# Patient Record
Sex: Male | Born: 1960 | Hispanic: No | Marital: Married | Smoking: Former smoker
Health system: Southern US, Community
[De-identification: ages and names within clinical notes are randomized; demographics above are authoritative.]

---

## 2006-04-21 ENCOUNTER — Ambulatory Visit: Payer: Self-pay | Admitting: Internal Medicine

## 2006-04-21 LAB — CONVERTED CEMR LAB
ALT: 52 units/L — ABNORMAL HIGH (ref 0–40)
AST: 28 units/L (ref 0–37)
Alkaline Phosphatase: 70 units/L (ref 39–117)
BUN: 9 mg/dL (ref 6–23)
Basophils Relative: 0.3 % (ref 0.0–1.0)
Calcium: 9.4 mg/dL (ref 8.4–10.5)
Chol/HDL Ratio, serum: 5.2
Cholesterol: 204 mg/dL (ref 0–200)
Glomerular Filtration Rate, Af Am: 116 mL/min/{1.73_m2}
HCT: 44.4 % (ref 39.0–52.0)
HDL: 39.5 mg/dL (ref >39.0)
Hemoglobin, Urine: NEGATIVE
Hemoglobin: 15.1 g/dL (ref 13.0–17.0)
LDL DIRECT: 167.2 mg/dL
Leukocytes, UA: NEGATIVE
Lymphocytes Relative: 31.1 % (ref 12.0–46.0)
Neutrophils Relative %: 54.8 % (ref 43.0–77.0)
Platelets: 285 10*3/uL (ref 150–400)
Potassium: 4.8 meq/L (ref 3.5–5.1)
TSH: 1.23 microintl units/mL (ref 0.35–5.50)
Total Protein: 6.5 g/dL (ref 6.0–8.3)
Urobilinogen, UA: 0.2 (ref 0.0–1.0)
WBC: 5.5 10*3/uL (ref 4.5–10.5)

## 2006-04-27 ENCOUNTER — Ambulatory Visit: Payer: Self-pay | Admitting: Internal Medicine

## 2006-08-25 ENCOUNTER — Ambulatory Visit: Payer: Self-pay

## 2006-08-31 ENCOUNTER — Ambulatory Visit: Payer: Self-pay | Admitting: Internal Medicine

## 2010-09-27 NOTE — Assessment & Plan Note (Signed)
Antonio [name]                           PRIMARY CARE OFFICE NOTE   NAME:[name], [name]                          MRN:          [id number]  DATE:04/27/2006                            DOB:          09/09/1960    CHIEF COMPLAINT:  New patient to practice.   HISTORY OF PRESENT ILLNESS:  The patient is a 50 year old white male  here to establish primary care.  He has not had a primary care physician  in the past.  His medical history is relatively benign.  No history of  major illnesses or hospitalizations.  He has been going to urgent care  facilities for upper respiratory infections and rashes.   His main concern today has been intermittent nonexertional chest  discomfort and his younger brother, at 64, recently underwent bypass  surgery x4.  His mother also is known to have coronary artery disease  and was status post PTCA with stent at age 42.   He works in Airline pilot.  He routinely lifts heavy objects and denies any  exertional symptoms.  In addition, he has never experienced any chest  tightness or pain with exertion.  His symptoms that he has had are  sporadic, not associated with activity.  Described as a cramping-like  sensation that lasts for a few seconds and then disappears.  He has had  a total of 4 episodes of this discomfort within the last month's time.   He has a remote history of tobacco abuse.  Quit tobacco 18 years ago.  He did smoke for 12 years, a pack a day.   PAST MEDICAL HISTORY SUMMARY:  1. History of genital warts.  2. Status post colonoscopy for rectal bleeding.  3. History of a colon polyp.  4. History of urethral stricture 1968 status post dilation.   CURRENT MEDICATIONS:  None, besides p.r.n. aspirin and ibuprofen.   ALLERGIES:  None known.   SOCIAL HISTORY:  The patient is married.  Has 4 children ages 62, 42,  15, and 3.  He is currently in Engineer, building services.   FAMILY HISTORY:  Mother is alive at age 29.   History is noted above.  Father is alive at age 70 and had an aneurysm and resultant right  hemiparesis.  Brother with coronary artery disease at age 80.  No family  history of type 2 diabetes.  No cancer in the family, including colon  cancer.   HABITS:  He averages 3 beers per week.  Quit tobacco 18 years ago.  Smoked 1 pack per day x12 years.   REVIEW OF SYSTEMS:  No fevers or chills.  No HEENT symptoms.  Cardiovascular symptoms as noted above.  No palpitations.  No chronic  cough.  Occasional heartburn/indigestion.  No dysuria, frequency, or  urgency, and all other systems negative.   PHYSICAL EXAM:  VITAL SIGNS:  Height is 5 feet 8 inches, weight 199  pounds, temperature is 98, pulse is 75, BP is 140/85.  GENERAL:  The patient is a pleasant well-developed, well-nourished 29-  year-old white male in no apparent distress.  HEENT:  Normocephalic, atraumatic.  Pupils are equal and reactive to  light bilaterally.  Extraocular motility was intact.  The patient was  anicteric.  Conjunctivae within normal limits.  Oropharyngeal exam was  unremarkable.  External auditory canals and tympanic membranes were  clear bilaterally.  NECK:  Supple.  No adenopathy, carotid bruit, or thyromegaly.  CHEST:  Normal inspiratory effort.  Clear to auscultation bilaterally.  No rhonchi, rales, or wheezing.  CARDIOVASCULAR:  Regular rate and rhythm.  No significant murmurs, rubs,  or gallops appreciated.  ABDOMEN:  Soft and nontender.  Positive bowel sounds.  No organomegaly.  MUSCULOSKELETAL:  No cyanosis, clubbing, or edema.  The patient had  intact pedis dorsalis pulses.  SKIN:  Warm and dry.  NEUROLOGIC:  Cranial nerves 2-12 are grossly intact.  He was nonfocal.   EKG was performed in the office, which showed normal sinus rhythm at 71  beats per minute.  No ST changes were noted.  No T waves.  He had  screening labs before our visit today.  CBC was notable for a normal H&H  of 15.1 and 44.4.   Lipid panel showed the following.  Total cholesterol  204, triglyceride 74, HDL 39.5, and a direct LDL of 167.2.  Comprehensive metabolic profile notable for an elevated glucose at 111,  BUN 9, creatinine 0.9.  ALT was minimally elevated at 52.  TSH was  normal at 1.23, PSA was 0.66, and UA was unremarkable.   IMPRESSION/RECOMMENDATIONS:  1. Atypical chest pain.  2. Elevated blood pressure without diagnosis of hypertension.  3. Hyperlipidemia.  4. Health maintenance.   RECOMMENDATIONS:  The patient will be sent for further cardiac testing.  We will schedule a treadmill Cardiolite regarding dyspnea, a family  history of coronary artery disease.   In terms of his hyperlipidemia, we discussed low fat/saturated fat diet.  He was also started on simvastatin 40 mg p.o. nightly.  On followup  blood test within a month, we will also add a hemoglobin A1C as well as  followup LFTs and lipid profiles.     Antonio Hair. Artist Pais, DO  Electronically Signed   RDY/MedQ  DD: 04/27/2006  DT: 04/27/2006  Job #: [id number]

## 2018-03-23 DIAGNOSIS — C4491 Basal cell carcinoma of skin, unspecified: Secondary | ICD-10-CM

## 2018-03-23 HISTORY — DX: Basal cell carcinoma of skin, unspecified: C44.91

## 2019-11-23 ENCOUNTER — Ambulatory Visit (INDEPENDENT_AMBULATORY_CARE_PROVIDER_SITE_OTHER): Payer: Self-pay | Admitting: Dermatology

## 2019-11-23 ENCOUNTER — Other Ambulatory Visit: Payer: Self-pay

## 2019-11-23 DIAGNOSIS — L821 Other seborrheic keratosis: Secondary | ICD-10-CM

## 2019-11-23 DIAGNOSIS — D171 Benign lipomatous neoplasm of skin and subcutaneous tissue of trunk: Secondary | ICD-10-CM

## 2019-11-23 DIAGNOSIS — C44519 Basal cell carcinoma of skin of other part of trunk: Secondary | ICD-10-CM

## 2019-11-23 DIAGNOSIS — D485 Neoplasm of uncertain behavior of skin: Secondary | ICD-10-CM

## 2019-11-23 DIAGNOSIS — L578 Other skin changes due to chronic exposure to nonionizing radiation: Secondary | ICD-10-CM

## 2019-11-23 DIAGNOSIS — L570 Actinic keratosis: Secondary | ICD-10-CM

## 2019-11-23 DIAGNOSIS — L814 Other melanin hyperpigmentation: Secondary | ICD-10-CM

## 2019-11-23 DIAGNOSIS — Z85828 Personal history of other malignant neoplasm of skin: Secondary | ICD-10-CM

## 2019-11-23 NOTE — Progress Notes (Addendum)
Follow-Up Visit   Subjective  Antonio [name] is a 59 y.o. male who presents for the following: Annual Exam.  Patient here today for annual exam. There was a spot on his back that we were watching and wanted to recheck. Patient does have a history of BCC which was treated with Moh's.  Patient does have some red splotches on his face as well as a spot on right cheek that has been there for about 1 month. He advises it had the texture of a wart, he pinched it off and it bled.   The following portions of the chart were reviewed this encounter and updated as appropriate:      Review of Systems:  No other skin or systemic complaints except as noted in HPI or Assessment and Plan.  Objective  Well appearing patient in no apparent distress; mood and affect are within normal limits.  All skin waist up examined.  Objective  Right Upper Lip: Well healed scar with no evidence of recurrence.   Objective  R Cheek  x 3, L temple x 1, L eyebrow x 1, R ear x 2 (7): Erythematous thin papules/macules with gritty scale. Pink scaly papule R malar cheek (HyAK vrs ISK)  Objective  Right Spinal Upper Back: 10 x 33mm pink scaly macule, slightly pearly  Objective  Right Inferior Clavicle: Subq rubbery nodule 2.5cm   Assessment & Plan  History of basal cell carcinoma (BCC) Right Upper Lip  Clear. Observe for recurrence. Call clinic for new or changing lesions.  Recommend regular skin exams, daily broad-spectrum spf 30+ sunscreen use, and photoprotection.     AK (actinic keratosis) (7) R Cheek  x 3, L temple x 1, L eyebrow x 1, R ear x 2  Destruction of lesion - R Cheek  x 3, L temple x 1, L eyebrow x 1, R ear x 2  Destruction method: cryotherapy   Informed consent: discussed and consent obtained   Lesion destroyed using liquid nitrogen: Yes   Region frozen until ice ball extended beyond lesion: Yes   Outcome: patient tolerated procedure well with no complications   Post-procedure details:  wound care instructions given   Additional details:  Prior to procedure, discussed risks of blister formation, small wound, skin dyspigmentation, or rare scar following cryotherapy.  Neoplasm of uncertain behavior of skin Right Spinal Upper Back  Skin / nail biopsy Type of biopsy: tangential   Informed consent: discussed and consent obtained   Patient was prepped and draped in usual sterile fashion: Area prepped with alcohol. Anesthesia: the lesion was anesthetized in a standard fashion   Anesthetic:  1% lidocaine w/ epinephrine 1-100,000 buffered w/ 8.4% NaHCO3 Instrument used: flexible razor blade   Hemostasis achieved with: pressure and aluminum chloride   Outcome: patient tolerated procedure well    Destruction of lesion Complexity: simple   Destruction method: electrodesiccation and curettage   Informed consent: discussed and consent obtained   Timeout:  patient name, date of birth, surgical site, and procedure verified Curettage performed in three different directions: Yes   Electrodesiccation performed over the curetted area: Yes   Lesion length (cm):  1 Lesion width (cm):  0.5 Margin per side (cm):  0.2 Final wound size (cm):  1.4 Hemostasis achieved with:  pressure, aluminum chloride and electrodesiccation Outcome: patient tolerated procedure well with no complications   Post-procedure details: wound care instructions given   Additional details:  Mupirocin ointment and Bandaid applied    Specimen 1 -  Surgical pathology Differential Diagnosis: AK vs BCC Check Margins: No 10 x 75mm pink scaly macule, slightly pearly  Lipoma of torso Right Inferior Clavicle  Reassured benign growth.  Recommend observation.  Discussed surgical excision in office if changes noted or symptomatic.    BCC (basal cell carcinoma), trunk Lentigines - Scattered tan macules - Discussed due to sun exposure - Benign, observe - Call for any changes Actinic Damage - diffuse erythematous  macules with underlying dyspigmentation - Recommend daily broad spectrum sunscreen SPF 30+ to sun-exposed areas, reapply every 2 hours as needed.  - Call for new or changing lesions. Seborrheic Keratoses - Stuck-on, waxy, tan-brown papules and plaques  - Discussed benign etiology and prognosis. - Observe - Call for any changes  Return for UBSE.  Graciella Belton, RMA, am acting as scribe for Brendolyn Patty, MD . Documentation: I have reviewed the above documentation for accuracy and completeness, and I agree with the above.  Brendolyn Patty MD

## 2019-11-23 NOTE — Patient Instructions (Addendum)
Recommend daily broad spectrum sunscreen SPF 30+ to sun-exposed areas, reapply every 2 hours as needed. Call for new or changing lesions.  Cryotherapy Aftercare  . Wash gently with soap and water everyday.   . Apply Vaseline and Band-Aid daily until healed.  Wound Care Instructions  1. Cleanse wound gently with soap and water once a day then pat dry with clean gauze. Apply a thing coat of Petrolatum (petroleum jelly, "Vaseline") over the wound (unless you have an allergy to this). We recommend that you use a new, sterile tube of Vaseline. Do not pick or remove scabs. Do not remove the yellow or white "healing tissue" from the base of the wound.  2. Cover the wound with fresh, clean, nonstick gauze and secure with paper tape. You may use Band-Aids in place of gauze and tape if the would is small enough, but would recommend trimming much of the tape off as there is often too much. Sometimes Band-Aids can irritate the skin.  3. You should call the office for your biopsy report after 1 week if you have not already been contacted.  4. If you experience any problems, such as abnormal amounts of bleeding, swelling, significant bruising, significant pain, or evidence of infection, please call the office immediately.  5. FOR ADULT SURGERY PATIENTS: If you need something for pain relief you may take 1 extra strength Tylenol (acetaminophen) AND 2 Ibuprofen (200mg each) together every 4 hours as needed for pain. (do not take these if you are allergic to them or if you have a reason you should not take them.) Typically, you may only need pain medication for 1 to 3 days.     

## 2019-11-28 ENCOUNTER — Telehealth: Payer: Self-pay

## 2019-11-28 NOTE — Telephone Encounter (Signed)
Tried to contact patient regarding biopsy results. No answer and voicemail not set up.

## 2019-11-28 NOTE — Telephone Encounter (Signed)
Patient called and informed of biopsy results, patient verbalized understanding.  

## 2020-01-31 ENCOUNTER — Encounter (HOSPITAL_COMMUNITY): Payer: Self-pay

## 2020-01-31 ENCOUNTER — Emergency Department (HOSPITAL_COMMUNITY)
Admission: EM | Admit: 2020-01-31 | Discharge: 2020-01-31 | Discharge status: Against Medical Advice | Payer: HRSA Program | Attending: Emergency Medicine | Admitting: Emergency Medicine

## 2020-01-31 ENCOUNTER — Other Ambulatory Visit: Payer: Self-pay

## 2020-01-31 ENCOUNTER — Emergency Department (HOSPITAL_COMMUNITY): Payer: HRSA Program

## 2020-01-31 DIAGNOSIS — U071 COVID-19: Secondary | ICD-10-CM | POA: Diagnosis not present

## 2020-01-31 DIAGNOSIS — J1282 Pneumonia due to coronavirus disease 2019: Secondary | ICD-10-CM | POA: Diagnosis not present

## 2020-01-31 DIAGNOSIS — C44519 Basal cell carcinoma of skin of other part of trunk: Secondary | ICD-10-CM | POA: Insufficient documentation

## 2020-01-31 DIAGNOSIS — R0602 Shortness of breath: Secondary | ICD-10-CM | POA: Diagnosis present

## 2020-01-31 DIAGNOSIS — R0902 Hypoxemia: Secondary | ICD-10-CM | POA: Diagnosis not present

## 2020-01-31 DIAGNOSIS — Z87891 Personal history of nicotine dependence: Secondary | ICD-10-CM | POA: Diagnosis not present

## 2020-01-31 DIAGNOSIS — C4401 Basal cell carcinoma of skin of lip: Secondary | ICD-10-CM | POA: Diagnosis not present

## 2020-01-31 LAB — COMPREHENSIVE METABOLIC PANEL
ALT: 42 U/L (ref 0–44)
AST: 40 U/L (ref 15–41)
Albumin: 3.1 g/dL — ABNORMAL LOW (ref 3.5–5.0)
Alkaline Phosphatase: 63 U/L (ref 38–126)
Anion gap: 11 (ref 5–15)
BUN: 15 mg/dL (ref 8–23)
CO2: 25 mmol/L (ref 22–32)
Calcium: 8.6 mg/dL — ABNORMAL LOW (ref 8.9–10.3)
Chloride: 99 mmol/L (ref 98–111)
Creatinine, Ser: 0.9 mg/dL (ref 0.61–1.24)
GFR calc Af Amer: >60 mL/min (ref >60)
GFR calc non Af Amer: >60 mL/min (ref >60)
Glucose, Bld: 124 mg/dL — ABNORMAL HIGH (ref 70–99)
Potassium: 4.6 mmol/L (ref 3.5–5.1)
Sodium: 135 mmol/L (ref 135–145)
Total Bilirubin: 0.8 mg/dL (ref 0.3–1.2)
Total Protein: 6.5 g/dL (ref 6.5–8.1)

## 2020-01-31 LAB — CBC WITH DIFFERENTIAL/PLATELET
Abs Immature Granulocytes: 0.18 10*3/uL — ABNORMAL HIGH (ref 0.00–0.07)
Basophils Absolute: 0 10*3/uL (ref 0.0–0.1)
Basophils Relative: 0 %
Eosinophils Absolute: 0 10*3/uL (ref 0.0–0.5)
Eosinophils Relative: 0 %
HCT: 42.9 % (ref 39.0–52.0)
Hemoglobin: 14.8 g/dL (ref 13.0–17.0)
Immature Granulocytes: 2 %
Lymphocytes Relative: 12 %
Lymphs Abs: 1.2 10*3/uL (ref 0.7–4.0)
MCH: 32.9 pg (ref 26.0–34.0)
MCHC: 34.5 g/dL (ref 30.0–36.0)
MCV: 95.3 fL (ref 80.0–100.0)
Monocytes Absolute: 0.9 10*3/uL (ref 0.1–1.0)
Monocytes Relative: 9 %
Neutro Abs: 7.7 10*3/uL (ref 1.7–7.7)
Neutrophils Relative %: 77 %
Platelets: 236 10*3/uL (ref 150–400)
RBC: 4.5 MIL/uL (ref 4.22–5.81)
RDW: 12.1 % (ref 11.5–15.5)
WBC: 10 10*3/uL (ref 4.0–10.5)
nRBC: 0 % (ref 0.0–0.2)

## 2020-01-31 LAB — SARS CORONAVIRUS 2 BY RT PCR (HOSPITAL ORDER, PERFORMED IN ~~LOC~~ HOSPITAL LAB): SARS Coronavirus 2: POSITIVE — AB

## 2020-01-31 LAB — TROPONIN I (HIGH SENSITIVITY): Troponin I (High Sensitivity): 7 ng/L (ref <18)

## 2020-01-31 MED ORDER — DEXAMETHASONE 6 MG PO TABS: 6.0000 mg | ORAL_TABLET | Freq: Every day | ORAL | 0 refills | Status: AC

## 2020-01-31 MED ORDER — DEXAMETHASONE SODIUM PHOSPHATE 10 MG/ML IJ SOLN
10.0000 mg | Freq: Once | INTRAMUSCULAR | Status: AC
  Administered 2020-01-31: 10 mg via INTRAVENOUS
  Filled 2020-01-31: qty 1

## 2020-01-31 MED ORDER — ALBUTEROL SULFATE HFA 108 (90 BASE) MCG/ACT IN AERS
1.0000 | INHALATION_SPRAY | Freq: Four times a day (QID) | RESPIRATORY_TRACT | 0 refills | Status: DC | PRN
Start: 2020-01-31 — End: 2020-01-31

## 2020-01-31 MED ORDER — DEXAMETHASONE 6 MG PO TABS: 6.0000 mg | ORAL_TABLET | Freq: Every day | ORAL | 0 refills | Status: DC

## 2020-01-31 MED ORDER — ALBUTEROL SULFATE HFA 108 (90 BASE) MCG/ACT IN AERS: 1.0000 | INHALATION_SPRAY | Freq: Four times a day (QID) | RESPIRATORY_TRACT | 0 refills | Status: DC | PRN

## 2020-01-31 NOTE — Discharge Instructions (Addendum)
Use the albuterol and the steroids.  You are leaving his medical advice today due to low oxygen levels.  Please return if you change your mind on admission or if your symptoms worsen.

## 2020-01-31 NOTE — ED Triage Notes (Signed)
Pt states SOB x 1 week, Wife tested positive

## 2020-01-31 NOTE — ED Notes (Signed)
Patient ambulated with pulse oximetry in room, oxygen saturation level ranging from 88%-93%. Patient continues to be on room air. AxOx4, no needs at this time, will continue to monitor.

## 2020-01-31 NOTE — ED Provider Notes (Signed)
[facility] DEPT Provider Note   CSN: [id number] Arrival date & time: 01/31/20  1657     History SOB, COVID exposure  Antonio [name] is a 59 y.o. male with history significant for actinic keratosis who presents for evaluation of shortness of breath.  Patient states his wife recently tested positive for Covid.  He has not been tested.  He is unvaccinated.  Has had intermittent cough and shortness of breath over the last few days.  States he has had some chills however has not taken his temperature.  Has been taking Tylenol and ibuprofen intermittently.  He denies headache, lightheadedness, dizziness, chest pain, hemoptysis, abdomino pain, diarrhea, dysuria, leg swelling, redness, warmth.  No prior history of cardiac or lung abnormalities.  No recent surgery, immobilization, unilateral leg swelling, PE, DVT.  Denies additional aggravating or alleviating factors.  History obtained from patient and past medical records.  No interpreter used.  HPI     Past Medical History:  Diagnosis Date  . Basal cell carcinoma 03/23/2018   right upper lip/inf nasolabial  . Basal cell carcinoma 11/23/2019   Right Spinal Upper Back, [facility]    Patient Active Problem List   Diagnosis Date Noted  . Basal cell carcinoma 03/23/2018    History reviewed    History reviewed. No pertinent family history.  Social History   Tobacco Use  . Smoking status: Former Research scientist (life sciences)  . Smokeless tobacco: Never Used  Substance Use Topics  . Alcohol use: Not on file  . Drug use: Not on file    Home Medications Prior to Admission medications   Medication Sig Start Date End Date Taking? Authorizing Provider  acetaminophen (TYLENOL) 500 MG tablet Take 1,000 mg by mouth every 6 (six) hours as needed for moderate pain.   Yes [provider]  Ascorbic Acid (VITAMIN C) 1000 MG tablet Take 1,000 mg by mouth daily.   Yes [provider]  cholecalciferol (VITAMIN D3) 25 MCG (1000  UNIT) tablet Take 1,000 Units by mouth daily.   Yes [provider]  ibuprofen (ADVIL) 200 MG tablet Take 400 mg by mouth every 6 (six) hours as needed for moderate pain.   Yes [provider]  Multiple Vitamins-Minerals (EMERGEN-C IMMUNE PO) Take 1 each by mouth daily.   Yes [provider]  zinc gluconate 50 MG tablet Take 50 mg by mouth daily.   Yes [provider]  albuterol (VENTOLIN HFA) 108 (90 Base) MCG/ACT inhaler Inhale 1-2 puffs into the lungs every 6 (six) hours as needed for wheezing or shortness of breath. 01/31/20   [name], PA-C  dexamethasone (DECADRON) 6 MG tablet Take 1 tablet (6 mg total) by mouth daily for 5 days. 01/31/20 02/05/20  [name], PA-C    Allergies    Tape  Review of Systems   Review of Systems  Constitutional: Positive for chills and fatigue. Negative for activity change, appetite change, diaphoresis, fever and unexpected weight change.  HENT: Negative.   Respiratory: Positive for cough and shortness of breath. Negative for apnea, choking, chest tightness, wheezing and stridor.   Cardiovascular: Negative.   Gastrointestinal: Negative.   Genitourinary: Negative.   Musculoskeletal: Negative.   Skin: Negative.   Neurological: Negative.   All other systems reviewed and are negative.   Physical Exam Updated Vital Signs BP 110/66   Pulse 74   Temp 98.4 F (36.9 C)   Resp 19   Ht 5\' 8"  (1.727 m)   Wt  95.3 kg   SpO2 93%   BMI 31.93 kg/m   Physical Exam Vitals and nursing note reviewed.  Constitutional:      General: He is not in acute distress.    Appearance: Normal appearance. He is well-developed. He is not ill-appearing, toxic-appearing or diaphoretic.  HENT:     Head: Normocephalic and atraumatic.     Nose: Nose normal.     Mouth/Throat:     Mouth: Mucous membranes are moist.  Eyes:     Pupils: Pupils are equal, round, and reactive to light.  Cardiovascular:     Rate and Rhythm:  Normal rate and regular rhythm.     Pulses: Normal pulses.     Heart sounds: Normal heart sounds.  Pulmonary:     Effort: Pulmonary effort is normal. No respiratory distress.     Comments: Very minimal crackles at bases to right lung.  Speaks in full sentences without difficulty.  No acute respiratory distress. Abdominal:     General: Bowel sounds are normal. There is no distension.     Palpations: Abdomen is soft.     Tenderness: There is no abdominal tenderness. There is no right CVA tenderness, left CVA tenderness, guarding or rebound.     Comments: Soft, nontender without rebound or guarding no overlying skin changes to abdominal wall  Musculoskeletal:        General: Normal range of motion.     Cervical back: Normal range of motion and neck supple.     Comments: Moves all 4 extremities without difficulty.  Compartments soft.  Bevelyn Buckles' sign negative.  No bony tenderness.  Skin:    General: Skin is warm and dry.     Capillary Refill: Capillary refill takes less than 2 seconds.     Comments: No edema, erythema or warmth.  No fluctuance or induration.  Neurological:     General: No focal deficit present.     Mental Status: He is alert and oriented to person, place, and time.     Comments: Cranial nerves II through XII grossly intact. Ambulatory without difficulty.     ED Results / Procedures / Treatments   Labs (all labs ordered are listed, but only abnormal results are displayed) Labs Reviewed  SARS CORONAVIRUS 2 BY RT PCR (HOSPITAL ORDER, [facility]) - Abnormal; Notable for the following components:      Result Value   SARS Coronavirus 2 POSITIVE (*)    All other components within normal limits  CBC WITH DIFFERENTIAL/PLATELET - Abnormal; Notable for the following components:   Abs Immature Granulocytes 0.18 (*)    All other components within normal limits  COMPREHENSIVE METABOLIC PANEL - Abnormal; Notable for the following components:   Glucose, Bld  124 (*)    Calcium 8.6 (*)    Albumin 3.1 (*)    All other components within normal limits  TROPONIN I (HIGH SENSITIVITY)  TROPONIN I (HIGH SENSITIVITY)    EKG EKG Interpretation  Date/Time:  Tuesday January 31 2020 17:15:12 EDT Ventricular Rate:  62 PR Interval:    QRS Duration: 89 QT Interval:  433 QTC Calculation: 440 R Axis:   78 Text Interpretation: Sinus rhythm No previous ECGs available Confirmed by Ripley Fraise [phone]) on 01/31/2020 5:19:28 PM   Radiology DG Chest Portable 1 View  Result Date: 01/31/2020 CLINICAL DATA:  Cough and shortness of breath, COVID exposure. Wife tested positive. EXAM: PORTABLE CHEST 1 VIEW COMPARISON:  None. FINDINGS: The heart size and mediastinal  contours are within normal limits. Diffuse peripheral hazy airspace opacities. No pulmonary edema. No pleural effusion. No pneumothorax. No acute osseous abnormality. IMPRESSION: Findings consistent with COVID-19 multifocal pneumonia. Electronically Signed   By: Iven Finn M.D.   On: 01/31/2020 18:11    Procedures Procedures (including critical care time)  Medications Ordered in ED Medications  dexamethasone (DECADRON) injection 10 mg (10 mg Intravenous Given 01/31/20 2130)    ED Course  I have reviewed the triage vital signs and the nursing notes.  Pertinent labs & imaging results that were available during my care of the patient were reviewed by me and considered in my medical decision making (see chart for details).  59 year old presents for evaluation of shortness of breath and cough.  He is afebrile, nonseptic, non-ill-appearing.  He is unvaccinated for Covid however his wife recently tested positive for Covid.  He has no chest pain.  Has very minimal crackles and faint wheeze to right lower lobe however does not appear in respiratory distress.  He has no tachycardia, tachypnea or hypoxia.  His abdomen is soft, nontender.  He is neurovascularly intact.  He has a normal musculoskeletal  exam.  His compartments are soft.  No clinical evidence of DVT on exam.  He appears overall well.  We will plan on labs, imaging and reassess.  Labs and imaging personally reviewed and interpreted:  CBC without leukocytosis CMP hyperglycemia at 124, calcium 8.6, albumin 3.1 no additional electrolyte, renal or liver normality Trop 7 COVID POSITIVE EKG without STEMI DG chest multifocal pneumonia consistent with Covid pneumonia  Patient reassessed.  Shortness of breath likely due to his Covid pneumonia.  I low suspicion for ACS, PE or dissection.  Low suspicion for acute bacterial pneumonia.  Patient reassessed. Oxygen saturations 90% on room air however patient becomes hypoxic into the 80s with ambulation or minimal talking in the room.  Patient is reluctant for admission however does ultimately agree for admission.  Declines remdesivir. Ok with steroids.  Patient requesting to speak with me about admission. He is now adamant he does not want admission. I discussed with patient that his oxygen levels are low when he speaks or when he ambulates.  Patient is still adamant that he does not want admission.  Patient has capacity make medical decisions.  I discussed risk versus benefit.  He voices understanding.  He still declines admission at this time.  We will give him symptomatic management with steroids. Declines MAB treatment.  We discussed the nature and purpose, risks and benefits, as well as, the alternatives of treatment. Time was given to allow the opportunity to ask questions and consider their options, and after the discussion, the patient decided to refuse the offerred treatment. The patient was informed that refusal could lead to, but was not limited to, death, permanent disability, or severe pain. If present, I asked the relatives or significant others to dissuade them without success. Prior to refusing, I determined that the patient had the capacity to make their decision and understood the  consequences of that decision. After refusal, I made every reasonable opportunity to treat them to the best of my ability.  The patient was notified that they may return to the emergency department at any time for further treatment.     Patient discussed with attending Dr. Tamera Punt.    MDM Rules/Calculators/A&P                          Alfredo Batty  Nepomuceno was evaluated in Emergency Department on 01/31/2020 for the symptoms described in the history of present illness. He was evaluated in the context of the global COVID-19 pandemic, which necessitated consideration that the patient might be at risk for infection with the SARS-CoV-2 virus that causes COVID-19. Institutional protocols and algorithms that pertain to the evaluation of patients at risk for COVID-19 are in a state of rapid change based on information released by regulatory bodies including the CDC and federal and state organizations. These policies and algorithms were followed during the patient's care in the ED. Final Clinical Impression(s) / ED Diagnoses Final diagnoses:  Pneumonia due to COVID-19 virus  Hypoxia    Rx / DC Orders ED Discharge Orders         Ordered    dexamethasone (DECADRON) 6 MG tablet  Daily,   Status:  Discontinued        01/31/20 2131    albuterol (VENTOLIN HFA) 108 (90 Base) MCG/ACT inhaler  Every 6 hours PRN,   Status:  Discontinued        01/31/20 2131    albuterol (VENTOLIN HFA) 108 (90 Base) MCG/ACT inhaler  Every 6 hours PRN        01/31/20 2143    dexamethasone (DECADRON) 6 MG tablet  Daily        01/31/20 2143           Laina Guerrieri A, PA-C 01/31/20 2229    Malvin Johns, MD 01/31/20 2245

## 2020-02-13 ENCOUNTER — Ambulatory Visit (INDEPENDENT_AMBULATORY_CARE_PROVIDER_SITE_OTHER): Payer: Self-pay | Admitting: Dermatology

## 2020-02-13 ENCOUNTER — Other Ambulatory Visit: Payer: Self-pay

## 2020-02-13 DIAGNOSIS — D485 Neoplasm of uncertain behavior of skin: Secondary | ICD-10-CM

## 2020-02-13 DIAGNOSIS — L578 Other skin changes due to chronic exposure to nonionizing radiation: Secondary | ICD-10-CM

## 2020-02-13 DIAGNOSIS — D492 Neoplasm of unspecified behavior of bone, soft tissue, and skin: Secondary | ICD-10-CM

## 2020-02-13 NOTE — Patient Instructions (Signed)

## 2020-02-13 NOTE — Progress Notes (Signed)
   Follow-Up Visit   Subjective  Antonio [name] is a 59 y.o. male who presents for the following: hx of ak has worsened (R cheek, LN2 11/23/19, now it bleeds when hit).  He has multiple AKs treated last visit, but this is the only one that didn't clear and got worse.   The following portions of the chart were reviewed this encounter and updated as appropriate:      Review of Systems:  No other skin or systemic complaints except as noted in HPI or Assessment and Plan.  Objective  Well appearing patient in no apparent distress; mood and affect are within normal limits.  A focused examination was performed including face. Relevant physical exam findings are noted in the Assessment and Plan.  Objective  R malar cheek: Dark brown black crusted pap 4.38mm      Assessment & Plan  Neoplasm of skin R malar cheek  Skin / nail biopsy Type of biopsy: tangential   Informed consent: discussed and consent obtained   Anesthesia: the lesion was anesthetized in a standard fashion   Anesthesia comment:  Area prepped with alcohol Anesthetic:  1% lidocaine w/ epinephrine 1-100,000 buffered w/ 8.4% NaHCO3 Instrument used: flexible razor blade   Hemostasis achieved with: pressure, aluminum chloride and electrodesiccation   Outcome: patient tolerated procedure well   Post-procedure details: wound care instructions given   Post-procedure details comment:  Ointment and small bandage applied  Specimen 1 - Surgical pathology Differential Diagnosis: D48.5 ISK vs AK vs other Check Margins: No Dark brown black crusted pap 4.27mm Hx of recent LN2  ISK vs AK vs Pyogenic Granuloma vs other, bx today  Actinic Damage - diffuse erythematous macules with underlying dyspigmentation - Recommend daily broad spectrum sunscreen SPF 30+ to sun-exposed areas, reapply every 2 hours as needed.  - Call for new or changing lesions.   Return for as scheduled for [facility] 06/25/2020 at 9:15.  I, Othelia Pulling, RMA, am  acting as scribe for Brendolyn Patty, MD .  Documentation: I have reviewed the above documentation for accuracy and completeness, and I agree with the above.  Brendolyn Patty MD

## 2020-02-20 ENCOUNTER — Telehealth: Payer: Self-pay

## 2020-02-20 NOTE — Telephone Encounter (Signed)
Left pt message to call for bx results/sh °

## 2020-02-20 NOTE — Telephone Encounter (Signed)
Informed pt of results and scheduled f/u.

## 2020-02-20 NOTE — Telephone Encounter (Signed)
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  Will need to recheck area once healed and possibly retreat with Ln2.  Schedule f/up 4-6 weeks.

## 2020-03-19 ENCOUNTER — Other Ambulatory Visit: Payer: Self-pay

## 2020-03-19 ENCOUNTER — Ambulatory Visit (INDEPENDENT_AMBULATORY_CARE_PROVIDER_SITE_OTHER): Payer: Self-pay | Admitting: Dermatology

## 2020-03-19 DIAGNOSIS — Z85828 Personal history of other malignant neoplasm of skin: Secondary | ICD-10-CM

## 2020-03-19 DIAGNOSIS — L570 Actinic keratosis: Secondary | ICD-10-CM

## 2020-03-19 DIAGNOSIS — L578 Other skin changes due to chronic exposure to nonionizing radiation: Secondary | ICD-10-CM

## 2020-03-19 DIAGNOSIS — Z872 Personal history of diseases of the skin and subcutaneous tissue: Secondary | ICD-10-CM

## 2020-03-19 NOTE — Patient Instructions (Signed)
Cryotherapy Aftercare  . Wash gently with soap and water everyday.   . Apply Vaseline and Band-Aid daily until healed.  

## 2020-03-19 NOTE — Progress Notes (Signed)
   Follow-Up Visit   Subjective  Antonio [name] is a 59 y.o. male who presents for the following: Follow-up (Biopsy 02/13/20 of the right malar cheek showed hyperplastic AK with HPV related changes. Patient states area is clear.). He also has a pink spot on his right temple that has come up in the last few days.   The following portions of the chart were reviewed this encounter and updated as appropriate:      Review of Systems:  No other skin or systemic complaints except as noted in HPI or Assessment and Plan.  Objective  Well appearing patient in no apparent distress; mood and affect are within normal limits.  A focused examination was performed including face. Relevant physical exam findings are noted in the Assessment and Plan.  Objective  Right Malar Cheek: Healed biopsy site.  Objective  Right Sideburn x 1: Pink scaly patch, 1.0cm.  Objective  Right Spinal Upper Back: Well healed scar with no evidence of recurrence.    Assessment & Plan   Actinic Damage - chronic, secondary to cumulative UV radiation exposure/sun exposure over time - diffuse scaly erythematous macules with underlying dyspigmentation - Recommend daily broad spectrum sunscreen SPF 30+ to sun-exposed areas, reapply every 2 hours as needed.  - Call for new or changing lesions.   History of actinic keratosis Right Malar Cheek  Hx of Hyperplastic AK with HPV Related Changes, biopsy proven.  Clear. Observe for recurrence. Call clinic for new or changing lesions.  Recommend regular skin exams, daily broad-spectrum spf 30+ sunscreen use, and photoprotection.     AK (actinic keratosis) Right Sideburn x 1  Destruction of lesion - Right Sideburn x 1  Destruction method: cryotherapy   Informed consent: discussed and consent obtained   Lesion destroyed using liquid nitrogen: Yes   Region frozen until ice ball extended beyond lesion: Yes   Outcome: patient tolerated procedure well with no complications     Post-procedure details: wound care instructions given    History of basal cell carcinoma (BCC) Right Spinal Upper Back  Clear. Observe for recurrence. Call clinic for new or changing lesions.  Recommend regular skin exams, daily broad-spectrum spf 30+ sunscreen use, and photoprotection.     Return as scheduled for UBSE.   IJamesetta Orleans, CMA, am acting as scribe for Brendolyn Patty, MD .  Documentation: I have reviewed the above documentation for accuracy and completeness, and I agree with the above.  Brendolyn Patty MD

## 2020-06-25 ENCOUNTER — Encounter: Payer: Self-pay | Admitting: Dermatology

## 2021-05-29 ENCOUNTER — Ambulatory Visit (INDEPENDENT_AMBULATORY_CARE_PROVIDER_SITE_OTHER): Payer: Self-pay | Admitting: Dermatology

## 2021-05-29 ENCOUNTER — Other Ambulatory Visit: Payer: Self-pay

## 2021-05-29 DIAGNOSIS — L578 Other skin changes due to chronic exposure to nonionizing radiation: Secondary | ICD-10-CM

## 2021-05-29 DIAGNOSIS — L814 Other melanin hyperpigmentation: Secondary | ICD-10-CM

## 2021-05-29 DIAGNOSIS — Z85828 Personal history of other malignant neoplasm of skin: Secondary | ICD-10-CM

## 2021-05-29 DIAGNOSIS — L219 Seborrheic dermatitis, unspecified: Secondary | ICD-10-CM

## 2021-05-29 DIAGNOSIS — L821 Other seborrheic keratosis: Secondary | ICD-10-CM

## 2021-05-29 DIAGNOSIS — L570 Actinic keratosis: Secondary | ICD-10-CM

## 2021-05-29 NOTE — Patient Instructions (Addendum)
Cryotherapy Aftercare  Wash gently with soap and water everyday.   Apply Vaseline and Band-Aid daily until healed.    Seborrheic Dermatitis  -  is a chronic persistent rash characterized by pinkness and scaling most commonly of the mid face but also can occur on the scalp (dandruff), ears; mid chest, mid back and groin.  It tends to be exacerbated by stress and cooler weather.  People who have neurologic disease may experience new onset or exacerbation of existing seborrheic dermatitis.  The condition is not curable but treatable and can be controlled. 1% Hydrodortisone Cream - Apply to scaly spots between eyes 1-2 times a day as needed.   If You Need Anything After Your Visit  If you have any questions or concerns for your doctor, please call our main line at [phone] and press option 4 to reach your doctor's medical assistant. If no one answers, please leave a voicemail as directed and we will return your call as soon as possible. Messages left after 4 pm will be answered the following business day.   You may also send [place] a message via [place]. We typically respond to MyChart messages within 1-2 business days.  For prescription refills, please ask your pharmacy to contact our office. Our fax number is [phone].  If you have an urgent issue when the clinic is closed that cannot wait until the next business day, you can page your doctor at the number below.    Please note that while we do our best to be available for urgent issues outside of office hours, we are not available 24/7.   If you have an urgent issue and are unable to reach [place], you may choose to seek medical care at your doctor's office, retail clinic, urgent care center, or emergency room.  If you have a medical emergency, please immediately call 911 or go to the emergency department.  Pager Numbers  - Dr. Nehemiah Massed: [phone]  - Dr. Laurence Ferrari: [phone]  - Dr. Nicole Kindred: [phone]  In the event of inclement  weather, please call our main line at [phone] for an update on the status of any delays or closures.  Dermatology Medication Tips: Please keep the boxes that topical medications come in in order to help keep track of the instructions about where and how to use these. Pharmacies typically print the medication instructions only on the boxes and not directly on the medication tubes.   If your medication is too expensive, please contact our office at [phone] option 4 or send [place] a message through [place].   We are unable to tell what your co-pay for medications will be in advance as this is different depending on your insurance coverage. However, we may be able to find a substitute medication at lower cost or fill out paperwork to get insurance to cover a needed medication.   If a prior authorization is required to get your medication covered by your insurance company, please allow [place] 1-2 business days to complete this process.  Drug prices often vary depending on where the prescription is filled and some pharmacies may offer cheaper prices.  The website www.goodrx.com contains coupons for medications through different pharmacies. The prices here do not account for what the cost may be with help from insurance (it may be cheaper with your insurance), but the website can give you the price if you did not use any insurance.  - You can print the associated coupon and take it with your prescription to the pharmacy.  -  You may also stop by our office during regular business hours and pick up a GoodRx coupon card.  - If you need your prescription sent electronically to a different pharmacy, notify our office through [facility] or by phone at [phone] option 4.     Si Usted Necesita Algo Despus de Su Visita  Tambin puede enviarnos un mensaje a travs de Pharmacist, community. Por lo general respondemos a los mensajes de MyChart en el transcurso de 1 a 2 das hbiles.  Para renovar recetas,  por favor pida a su farmacia que se ponga en contacto con nuestra oficina. Harland Dingwall de fax es [place] [phone].  Si tiene un asunto urgente cuando la clnica est cerrada y que no puede esperar hasta el siguiente da hbil, puede llamar/localizar a su doctor(a) al nmero que aparece a continuacin.   Por favor, tenga en cuenta que aunque hacemos todo lo posible para estar disponibles para asuntos urgentes fuera del horario de [place], no estamos disponibles las 24 horas del da, los 7 das de la [place].   Si tiene un problema urgente y no puede comunicarse con nosotros, puede optar por buscar atencin mdica  en el consultorio de su doctor(a), en una clnica privada, en un centro de atencin urgente o en una sala de emergencias.  Si tiene Engineering geologist, por favor llame inmediatamente al 911 o vaya a la sala de emergencias.  Nmeros de bper  - Dr. Nehemiah Massed: [phone]  - Dra. Moye: [phone]  - Dra. Nicole Kindred: [phone]  En caso de inclemencias del [place], por favor llame a Johnsie Kindred principal al [phone] para una actualizacin sobre el [place] de cualquier retraso o cierre.  Consejos para la medicacin en dermatologa: Por favor, guarde las cajas en las que vienen los medicamentos de uso tpico para ayudarle a seguir las instrucciones sobre dnde y cmo usarlos. Las farmacias generalmente imprimen las instrucciones del medicamento slo en las cajas y no directamente en los tubos del [place].   Si su medicamento es muy caro, por favor, pngase en contacto con Zigmund Daniel llamando al [phone] y presione la opcin 4 o envenos un mensaje a travs de Pharmacist, community.   No podemos decirle cul ser su copago por los medicamentos por adelantado ya que esto es diferente dependiendo de la cobertura de su seguro. Sin embargo, es posible que podamos encontrar un medicamento sustituto a Electrical engineer un formulario para que el seguro cubra el medicamento que se  considera necesario.   Si se requiere una autorizacin previa para que su compaa de seguros [place] su medicamento, por favor permtanos de 1 a 2 das hbiles para completar este proceso.  Los precios de los medicamentos varan con frecuencia dependiendo del Environmental consultant de dnde se surte la receta y alguna farmacias pueden ofrecer precios ms baratos.  El sitio web www.goodrx.com tiene cupones para medicamentos de Airline pilot. Los precios aqu no tienen en cuenta lo que podra costar con la ayuda del seguro (puede ser ms barato con su seguro), pero el sitio web puede darle el precio si no utiliz Research scientist (physical sciences).  - Puede imprimir el cupn correspondiente y llevarlo con su receta a la farmacia.  - Tambin puede pasar por nuestra oficina durante el horario de atencin regular y Charity fundraiser una tarjeta de cupones de GoodRx.  - Si necesita que su receta se enve electrnicamente a Chiropodist, informe a nuestra oficina a travs de MyChart de [place] o por telfono llamando al [phone] y  presione la opcin 4.

## 2021-05-29 NOTE — Progress Notes (Signed)
Follow-Up Visit   Subjective  Antonio [name] is a 61 y.o. male who presents for the following: Sore spot (Scalp x 6 months. Won't heal. No known history of bleeding. ). Hx of BCCs of the right upper lip/inf nasolabial (2019) and right spinal upper back (2021). Pt is self pay.   The following portions of the chart were reviewed this encounter and updated as appropriate:       Review of Systems:  No other skin or systemic complaints except as noted in HPI or Assessment and Plan.  Objective  Well appearing patient in no apparent distress; mood and affect are within normal limits.  All skin waist up examined.  Mid crown Keratotic papule  L eyebrow/temple x 4 (4) Pink scaly macules.   nasal root Mild erythema and scale    Assessment & Plan  Actinic Damage - chronic, secondary to cumulative UV radiation exposure/sun exposure over time - diffuse scaly erythematous macules with underlying dyspigmentation - Recommend daily broad spectrum sunscreen SPF 30+ to sun-exposed areas, reapply every 2 hours as needed.  - Recommend staying in the shade or wearing long sleeves, sun glasses (UVA+UVB protection) and wide brim hats (4-inch brim around the entire circumference of the hat). - Call for new or changing lesions.  Lentigines - Scattered tan macules - Due to sun exposure - Benign-appering, observe - Recommend daily broad spectrum sunscreen SPF 30+ to sun-exposed areas, reapply every 2 hours as needed. - Call for any changes  Seborrheic Keratoses - Stuck-on, waxy, tan-brown papules and/or plaques  - Benign-appearing - Discussed benign etiology and prognosis. - Observe - Call for any changes  Hypertrophic actinic keratosis Mid crown  vs ISK  Pt will RTC if doesn't clear.  Actinic keratoses are precancerous spots that appear secondary to cumulative UV radiation exposure/sun exposure over time. They are chronic with expected duration over 1 year. A portion of actinic  keratoses will progress to squamous cell carcinoma of the skin. It is not possible to reliably predict which spots will progress to skin cancer and so treatment is recommended to prevent development of skin cancer.  Recommend daily broad spectrum sunscreen SPF 30+ to sun-exposed areas, reapply every 2 hours as needed.  Recommend staying in the shade or wearing long sleeves, sun glasses (UVA+UVB protection) and wide brim hats (4-inch brim around the entire circumference of the hat). Call for new or changing lesions.  Destruction of lesion - Mid crown  Destruction method: cryotherapy   Informed consent: discussed and consent obtained   Lesion destroyed using liquid nitrogen: Yes   Region frozen until ice ball extended beyond lesion: Yes   Outcome: patient tolerated procedure well with no complications   Post-procedure details: wound care instructions given   Additional details:  Prior to procedure, discussed risks of blister formation, small wound, skin dyspigmentation, or rare scar following cryotherapy. Recommend Vaseline ointment to treated areas while healing.   AK (actinic keratosis) (4) L eyebrow/temple x 4  Actinic keratoses are precancerous spots that appear secondary to cumulative UV radiation exposure/sun exposure over time. They are chronic with expected duration over 1 year. A portion of actinic keratoses will progress to squamous cell carcinoma of the skin. It is not possible to reliably predict which spots will progress to skin cancer and so treatment is recommended to prevent development of skin cancer.  Recommend daily broad spectrum sunscreen SPF 30+ to sun-exposed areas, reapply every 2 hours as needed.  Recommend staying in the shade or wearing  long sleeves, sun glasses (UVA+UVB protection) and wide brim hats (4-inch brim around the entire circumference of the hat). Call for new or changing lesions.  Destruction of lesion - L eyebrow/temple x 4  Destruction method:  cryotherapy   Informed consent: discussed and consent obtained   Lesion destroyed using liquid nitrogen: Yes   Region frozen until ice ball extended beyond lesion: Yes   Outcome: patient tolerated procedure well with no complications   Post-procedure details: wound care instructions given   Additional details:  Prior to procedure, discussed risks of blister formation, small wound, skin dyspigmentation, or rare scar following cryotherapy. Recommend Vaseline ointment to treated areas while healing.   Seborrheic dermatitis nasal root  Start 1% hydrocortisone cream qd/bid prn flares.  Continue Head & Shoulders shampoo to scalp.  Seborrheic Dermatitis  -  is a chronic persistent rash characterized by pinkness and scaling most commonly of the mid face but also can occur on the scalp (dandruff), ears; mid chest, mid back and groin.  It tends to be exacerbated by stress and cooler weather.  People who have neurologic disease may experience new onset or exacerbation of existing seborrheic dermatitis.  The condition is not curable but treatable and can be controlled.   History of Basal Cell Carcinoma of the Skin - No evidence of recurrence today of the R upper lip/inf nasolabial and R spinal upper back - Recommend regular full body skin exams - Recommend daily broad spectrum sunscreen SPF 30+ to sun-exposed areas, reapply every 2 hours as needed.  - Call if any new or changing lesions are noted between office visits  Return if symptoms worsen or fail to improve.  IJamesetta Orleans, CMA, am acting as scribe for Brendolyn Patty, MD . Documentation: I have reviewed the above documentation for accuracy and completeness, and I agree with the above.  Brendolyn Patty MD

## 2021-08-01 ENCOUNTER — Encounter (HOSPITAL_COMMUNITY): Payer: Self-pay | Admitting: Pharmacy Technician

## 2021-08-01 ENCOUNTER — Inpatient Hospital Stay (HOSPITAL_COMMUNITY)
Admission: EM | Admit: 2021-08-01 | Discharge: 2021-08-10 | DRG: 234 | Disposition: A | Discharge status: Home / Self Care | Payer: Self-pay | Attending: Thoracic Surgery (Cardiothoracic Vascular Surgery) | Admitting: Thoracic Surgery (Cardiothoracic Vascular Surgery)

## 2021-08-01 ENCOUNTER — Encounter (HOSPITAL_COMMUNITY)
Admission: EM | Disposition: A | Discharge status: Home / Self Care | Payer: Self-pay | Source: Home / Self Care | Attending: Cardiology

## 2021-08-01 ENCOUNTER — Other Ambulatory Visit: Payer: Self-pay

## 2021-08-01 ENCOUNTER — Emergency Department (HOSPITAL_COMMUNITY): Payer: Self-pay

## 2021-08-01 DIAGNOSIS — Z951 Presence of aortocoronary bypass graft: Secondary | ICD-10-CM

## 2021-08-01 DIAGNOSIS — E669 Obesity, unspecified: Secondary | ICD-10-CM | POA: Diagnosis present

## 2021-08-01 DIAGNOSIS — D62 Acute posthemorrhagic anemia: Secondary | ICD-10-CM | POA: Diagnosis not present

## 2021-08-01 DIAGNOSIS — R911 Solitary pulmonary nodule: Secondary | ICD-10-CM

## 2021-08-01 DIAGNOSIS — E785 Hyperlipidemia, unspecified: Secondary | ICD-10-CM

## 2021-08-01 DIAGNOSIS — Z20822 Contact with and (suspected) exposure to covid-19: Secondary | ICD-10-CM | POA: Diagnosis present

## 2021-08-01 DIAGNOSIS — Z8249 Family history of ischemic heart disease and other diseases of the circulatory system: Secondary | ICD-10-CM

## 2021-08-01 DIAGNOSIS — Z6833 Body mass index (BMI) 33.0-33.9, adult: Secondary | ICD-10-CM

## 2021-08-01 DIAGNOSIS — Z7982 Long term (current) use of aspirin: Secondary | ICD-10-CM

## 2021-08-01 DIAGNOSIS — Z79899 Other long term (current) drug therapy: Secondary | ICD-10-CM

## 2021-08-01 DIAGNOSIS — I249 Acute ischemic heart disease, unspecified: Secondary | ICD-10-CM | POA: Diagnosis present

## 2021-08-01 DIAGNOSIS — I214 Non-ST elevation (NSTEMI) myocardial infarction: Principal | ICD-10-CM

## 2021-08-01 DIAGNOSIS — Z85828 Personal history of other malignant neoplasm of skin: Secondary | ICD-10-CM

## 2021-08-01 DIAGNOSIS — R030 Elevated blood-pressure reading, without diagnosis of hypertension: Secondary | ICD-10-CM

## 2021-08-01 DIAGNOSIS — E7800 Pure hypercholesterolemia, unspecified: Secondary | ICD-10-CM | POA: Diagnosis present

## 2021-08-01 DIAGNOSIS — Z87891 Personal history of nicotine dependence: Secondary | ICD-10-CM

## 2021-08-01 DIAGNOSIS — R739 Hyperglycemia, unspecified: Secondary | ICD-10-CM | POA: Diagnosis not present

## 2021-08-01 DIAGNOSIS — I10 Essential (primary) hypertension: Secondary | ICD-10-CM | POA: Diagnosis present

## 2021-08-01 DIAGNOSIS — I2510 Atherosclerotic heart disease of native coronary artery without angina pectoris: Secondary | ICD-10-CM

## 2021-08-01 DIAGNOSIS — R072 Precordial pain: Secondary | ICD-10-CM

## 2021-08-01 DIAGNOSIS — I459 Conduction disorder, unspecified: Secondary | ICD-10-CM | POA: Diagnosis not present

## 2021-08-01 HISTORY — PX: LEFT HEART CATH AND CORONARY ANGIOGRAPHY: CATH118249

## 2021-08-01 LAB — TROPONIN I (HIGH SENSITIVITY)
Troponin I (High Sensitivity): 162 ng/L (ref <18)
Troponin I (High Sensitivity): 169 ng/L (ref <18)

## 2021-08-01 LAB — BASIC METABOLIC PANEL
Anion gap: 6 (ref 5–15)
BUN: 10 mg/dL (ref 8–23)
CO2: 28 mmol/L (ref 22–32)
Calcium: 9.6 mg/dL (ref 8.9–10.3)
Chloride: 104 mmol/L (ref 98–111)
Creatinine, Ser: 1.12 mg/dL (ref 0.61–1.24)
GFR, Estimated: >60 mL/min (ref >60)
Glucose, Bld: 99 mg/dL (ref 70–99)
Potassium: 4.8 mmol/L (ref 3.5–5.1)
Sodium: 138 mmol/L (ref 135–145)

## 2021-08-01 LAB — CBC
HCT: 54.9 % — ABNORMAL HIGH (ref 39.0–52.0)
Hemoglobin: 18.7 g/dL — ABNORMAL HIGH (ref 13.0–17.0)
MCH: 32 pg (ref 26.0–34.0)
MCHC: 34.1 g/dL (ref 30.0–36.0)
MCV: 93.8 fL (ref 80.0–100.0)
Platelets: 224 10*3/uL (ref 150–400)
RBC: 5.85 MIL/uL — ABNORMAL HIGH (ref 4.22–5.81)
RDW: 12.4 % (ref 11.5–15.5)
WBC: 7.6 10*3/uL (ref 4.0–10.5)
nRBC: 0 % (ref 0.0–0.2)

## 2021-08-01 LAB — BRAIN NATRIURETIC PEPTIDE: B Natriuretic Peptide: 26.3 pg/mL (ref 0.0–100.0)

## 2021-08-01 SURGERY — LEFT HEART CATH AND CORONARY ANGIOGRAPHY
Anesthesia: LOCAL

## 2021-08-01 MED ORDER — VERAPAMIL HCL 2.5 MG/ML IV SOLN
INTRAVENOUS | Status: AC
  Filled 2021-08-01: qty 2

## 2021-08-01 MED ORDER — SODIUM CHLORIDE 0.9 % IV SOLN: 250.0000 mL | INTRAVENOUS | Status: DC | PRN

## 2021-08-01 MED ORDER — SODIUM CHLORIDE 0.9% FLUSH
3.0000 mL | Freq: Two times a day (BID) | INTRAVENOUS | Status: DC
  Administered 2021-08-01 – 2021-08-05 (×3): 3 mL via INTRAVENOUS

## 2021-08-01 MED ORDER — HEPARIN SODIUM (PORCINE) 1000 UNIT/ML IJ SOLN
INTRAMUSCULAR | Status: DC | PRN
  Administered 2021-08-01: 4500 [IU] via INTRAVENOUS

## 2021-08-01 MED ORDER — NITROGLYCERIN 0.4 MG SL SUBL: 0.4000 mg | SUBLINGUAL_TABLET | SUBLINGUAL | Status: DC | PRN

## 2021-08-01 MED ORDER — ASPIRIN 81 MG PO CHEW
324.0000 mg | CHEWABLE_TABLET | Freq: Once | ORAL | Status: DC
  Filled 2021-08-01: qty 4

## 2021-08-01 MED ORDER — MIDAZOLAM HCL 2 MG/2ML IJ SOLN
INTRAMUSCULAR | Status: AC
  Filled 2021-08-01: qty 2

## 2021-08-01 MED ORDER — HEPARIN (PORCINE) IN NACL 1000-0.9 UT/500ML-% IV SOLN
INTRAVENOUS | Status: AC
  Filled 2021-08-01: qty 1000

## 2021-08-01 MED ORDER — HEPARIN SODIUM (PORCINE) 1000 UNIT/ML IJ SOLN
INTRAMUSCULAR | Status: AC
  Filled 2021-08-01: qty 10

## 2021-08-01 MED ORDER — FENTANYL CITRATE (PF) 100 MCG/2ML IJ SOLN
INTRAMUSCULAR | Status: DC | PRN
  Administered 2021-08-01: 25 ug via INTRAVENOUS

## 2021-08-01 MED ORDER — ASPIRIN EC 81 MG PO TBEC
81.0000 mg | DELAYED_RELEASE_TABLET | Freq: Every day | ORAL | Status: DC
  Administered 2021-08-02 – 2021-08-05 (×4): 81 mg via ORAL
  Filled 2021-08-01 (×4): qty 1

## 2021-08-01 MED ORDER — SODIUM CHLORIDE 0.9% FLUSH
3.0000 mL | Freq: Two times a day (BID) | INTRAVENOUS | Status: DC
  Administered 2021-08-01 – 2021-08-02 (×2): 3 mL via INTRAVENOUS

## 2021-08-01 MED ORDER — HEPARIN BOLUS VIA INFUSION
4000.0000 [IU] | Freq: Once | INTRAVENOUS | Status: AC
Start: 2021-08-01 — End: 2021-08-01
  Administered 2021-08-01: 4000 [IU] via INTRAVENOUS
  Filled 2021-08-01: qty 4000

## 2021-08-01 MED ORDER — VERAPAMIL HCL 2.5 MG/ML IV SOLN
INTRAVENOUS | Status: DC | PRN
  Administered 2021-08-01: 10 mL via INTRA_ARTERIAL

## 2021-08-01 MED ORDER — ONDANSETRON HCL 4 MG/2ML IJ SOLN: 4.0000 mg | Freq: Four times a day (QID) | INTRAMUSCULAR | Status: DC | PRN

## 2021-08-01 MED ORDER — HEPARIN (PORCINE) 25000 UT/250ML-% IV SOLN
1200.0000 [IU]/h | INTRAVENOUS | Status: DC
Start: 2021-08-01 — End: 2021-08-01
  Administered 2021-08-01: 1200 [IU]/h via INTRAVENOUS
  Filled 2021-08-01: qty 250

## 2021-08-01 MED ORDER — ASCORBIC ACID 500 MG PO TABS
1000.0000 mg | ORAL_TABLET | Freq: Every day | ORAL | Status: DC
  Administered 2021-08-02 – 2021-08-05 (×4): 1000 mg via ORAL
  Filled 2021-08-01 (×4): qty 2

## 2021-08-01 MED ORDER — LABETALOL HCL 5 MG/ML IV SOLN: 10.0000 mg | INTRAVENOUS | Status: AC | PRN

## 2021-08-01 MED ORDER — LIDOCAINE HCL (PF) 1 % IJ SOLN
INTRAMUSCULAR | Status: DC | PRN
  Administered 2021-08-01: 2 mL

## 2021-08-01 MED ORDER — HEPARIN (PORCINE) 25000 UT/250ML-% IV SOLN
1650.0000 [IU]/h | INTRAVENOUS | Status: DC
  Administered 2021-08-02: 1550 [IU]/h via INTRAVENOUS
  Administered 2021-08-02: 1350 [IU]/h via INTRAVENOUS
  Administered 2021-08-03: 1700 [IU]/h via INTRAVENOUS
  Administered 2021-08-04 – 2021-08-06 (×4): 1650 [IU]/h via INTRAVENOUS
  Filled 2021-08-01 (×6): qty 250

## 2021-08-01 MED ORDER — MIDAZOLAM HCL 2 MG/2ML IJ SOLN
INTRAMUSCULAR | Status: DC | PRN
  Administered 2021-08-01: 2 mg via INTRAVENOUS

## 2021-08-01 MED ORDER — SODIUM CHLORIDE 0.9% FLUSH: 3.0000 mL | INTRAVENOUS | Status: DC | PRN

## 2021-08-01 MED ORDER — HEPARIN (PORCINE) IN NACL 1000-0.9 UT/500ML-% IV SOLN
INTRAVENOUS | Status: DC | PRN
  Administered 2021-08-01 (×2): 500 mL

## 2021-08-01 MED ORDER — SODIUM CHLORIDE 0.9 % WEIGHT BASED INFUSION: 1.0000 mL/kg/h | INTRAVENOUS | Status: DC

## 2021-08-01 MED ORDER — METOPROLOL TARTRATE 25 MG PO TABS
25.0000 mg | ORAL_TABLET | Freq: Two times a day (BID) | ORAL | Status: DC
Start: 2021-08-01 — End: 2021-08-06
  Administered 2021-08-01 – 2021-08-05 (×9): 25 mg via ORAL
  Filled 2021-08-01 (×9): qty 1

## 2021-08-01 MED ORDER — HYDRALAZINE HCL 20 MG/ML IJ SOLN: 10.0000 mg | INTRAMUSCULAR | Status: AC | PRN

## 2021-08-01 MED ORDER — SODIUM CHLORIDE 0.9 % IV SOLN: INTRAVENOUS | Status: AC

## 2021-08-01 MED ORDER — SODIUM CHLORIDE 0.9 % WEIGHT BASED INFUSION: 3.0000 mL/kg/h | INTRAVENOUS | Status: AC

## 2021-08-01 MED ORDER — LIDOCAINE HCL (PF) 1 % IJ SOLN
INTRAMUSCULAR | Status: AC
  Filled 2021-08-01: qty 30

## 2021-08-01 MED ORDER — ACETAMINOPHEN 325 MG PO TABS
650.0000 mg | ORAL_TABLET | ORAL | Status: DC | PRN
  Administered 2021-08-01: 650 mg via ORAL
  Filled 2021-08-01: qty 2

## 2021-08-01 MED ORDER — SODIUM CHLORIDE 0.9 % IV SOLN
INTRAVENOUS | Status: AC | PRN
  Administered 2021-08-01: 10 mL/h via INTRAVENOUS

## 2021-08-01 MED ORDER — FENTANYL CITRATE (PF) 100 MCG/2ML IJ SOLN
INTRAMUSCULAR | Status: AC
Start: 2021-08-01 — End: ?
  Filled 2021-08-01: qty 2

## 2021-08-01 MED ORDER — IOHEXOL 350 MG/ML SOLN
INTRAVENOUS | Status: DC | PRN
Start: 2021-08-01 — End: 2021-08-01
  Administered 2021-08-01: 55 mL

## 2021-08-01 MED ORDER — VITAMIN D 25 MCG (1000 UNIT) PO TABS
1000.0000 [IU] | ORAL_TABLET | Freq: Every day | ORAL | Status: DC
  Administered 2021-08-02 – 2021-08-05 (×4): 1000 [IU] via ORAL
  Filled 2021-08-01 (×4): qty 1

## 2021-08-01 MED ORDER — ACETAMINOPHEN 325 MG PO TABS: 650.0000 mg | ORAL_TABLET | ORAL | Status: DC | PRN

## 2021-08-01 MED ORDER — ATORVASTATIN CALCIUM 80 MG PO TABS
80.0000 mg | ORAL_TABLET | Freq: Every day | ORAL | Status: DC
  Administered 2021-08-01 – 2021-08-09 (×8): 80 mg via ORAL
  Filled 2021-08-01 (×8): qty 1

## 2021-08-01 SURGICAL SUPPLY — 13 items
BAND CMPR LRG ZPHR (HEMOSTASIS) ×1
BAND ZEPHYR COMPRESS 30 LONG (HEMOSTASIS) ×1 IMPLANT
CATH INFINITI 5 FR JL3.5 (CATHETERS) ×1 IMPLANT
CATH INFINITI 5 FR JR5 (CATHETERS) ×1 IMPLANT
CATH INFINITI 5FR MULTPACK ANG (CATHETERS) ×1 IMPLANT
GLIDESHEATH SLEND SS 6F .021 (SHEATH) ×1 IMPLANT
GUIDEWIRE INQWIRE 1.5J.035X260 (WIRE) IMPLANT
INQWIRE 1.5J .035X260CM (WIRE) ×2
KIT HEART LEFT (KITS) ×3 IMPLANT
PACK CARDIAC CATHETERIZATION (CUSTOM PROCEDURE TRAY) ×3 IMPLANT
SYR MEDRAD MARK 7 150ML (SYRINGE) ×3 IMPLANT
TRANSDUCER W/STOPCOCK (MISCELLANEOUS) ×3 IMPLANT
TUBING CIL FLEX 10 FLL-RA (TUBING) ×3 IMPLANT

## 2021-08-01 NOTE — ED Triage Notes (Signed)
Pt here with reports of shob with exertion for "a while". Pt also states he had some chest tightness on Sunday. Pt went to PCP and had troponin and BNP drawn and was told to come here for evaluation.  ?

## 2021-08-01 NOTE — Interval H&P Note (Signed)
Cath Lab Visit (complete for each Cath Lab visit) ? ?Clinical Evaluation Leading to the Procedure:  ? ?ACS: Yes.   ? ?Non-ACS:   ? ?Anginal Classification: CCS IV ? ?Anti-ischemic medical therapy: Minimal Therapy (1 class of medications) ? ?Non-Invasive Test Results: No non-invasive testing performed ? ?Prior CABG: No previous CABG ? ? ? ? ? ?History and Physical Interval Note: ? ?08/01/2021 ?3:03 PM ? ?Antonio [name]  has presented today for surgery, with the diagnosis of acs.  The various methods of treatment have been discussed with the patient and family. After consideration of risks, benefits and other options for treatment, the patient has consented to  Procedure(s): ?LEFT HEART CATH AND CORONARY ANGIOGRAPHY (N/A) as a surgical intervention.  The patient's history has been reviewed, patient examined, no change in status, stable for surgery.  I have reviewed the patient's chart and labs.  Questions were answered to the patient's satisfaction.   ? ? ?Larae Grooms ? ? ?

## 2021-08-01 NOTE — ED Provider Notes (Addendum)
?[place] ?Provider Note ? ? ?CSN: [id number] ?Arrival date & time: 08/01/21  1009 ? ?  ? ?History ? ?Chief Complaint  ?Patient presents with  ? Shortness of Breath  ? Chest Pain  ? ? ?Antonio [name] is a 61 y.o. male. ? ?Pt c/o chest discomfort on and off for past several weeks, worse w exertion, better with rest, lasts several minutes. +associated sob. No nv or diaphoresis. Had particularly bad/more prolonged episode of discomfort 4 days ago, and went to doctor 3 days ago and had labs done - ck/ckmb and trop all high then, and was advised to come to ED today. Pt with very strong family hx cad, with parents and siblings all having cad. No current chest pain.  ? ?The history is provided by the patient, medical records and the spouse.  ?Shortness of Breath ?Associated symptoms: chest pain   ?Associated symptoms: no abdominal pain, no cough, no fever, no headaches, no neck pain, no rash and no sore throat   ?Chest Pain ?Associated symptoms: shortness of breath   ?Associated symptoms: no abdominal pain, no back pain, no cough, no fever and no headache   ? ?  ? ?Home Medications ?Prior to Admission medications   ?Medication Sig Start Date End Date Taking? Authorizing Provider  ?acetaminophen (TYLENOL) 500 MG tablet Take 1,000 mg by mouth every 6 (six) hours as needed for moderate pain.    [provider]  ?Ascorbic Acid (VITAMIN C) 1000 MG tablet Take 1,000 mg by mouth daily.    [provider]  ?cholecalciferol (VITAMIN D3) 25 MCG (1000 UNIT) tablet Take 1,000 Units by mouth daily.    [provider]  ?ibuprofen (ADVIL) 200 MG tablet Take 400 mg by mouth every 6 (six) hours as needed for moderate pain.    [provider]  ?Multiple Vitamins-Minerals (EMERGEN-C IMMUNE PO) Take 1 each by mouth daily.    [provider]  ?testosterone cypionate (DEPOTESTOSTERONE CYPIONATE) 200 MG/ML injection SMARTSIG:Milliliter(s) IM 04/30/21   [provider]  ?   ? ?Allergies    ?Patient has no known allergies.   ? ?Review of Systems   ?Review of Systems  ?Constitutional:  Negative for fever.  ?HENT:  Negative for sore throat.   ?Eyes:  Negative for redness.  ?Respiratory:  Positive for shortness of breath. Negative for cough.   ?Cardiovascular:  Positive for chest pain.  ?Gastrointestinal:  Negative for abdominal pain.  ?Genitourinary:  Negative for flank pain.  ?Musculoskeletal:  Negative for back pain and neck pain.  ?Skin:  Negative for rash.  ?Neurological:  Negative for headaches.  ?Hematological:  Does not bruise/bleed easily.  ?Psychiatric/Behavioral:  Negative for confusion.   ? ?Physical Exam ?Updated Vital Signs ?BP (!) 171/113 (BP Location: Left Arm)   Pulse 75   Temp 98.5 ?F (36.9 ?C) (Oral)   Resp 16   Ht 1.727 m ('5\' 8"'$ )   Wt 93.4 kg   SpO2 98%   BMI 31.32 kg/m?  ?Physical Exam ?Vitals and nursing note reviewed.  ?Constitutional:   ?   Appearance: Normal appearance. He is well-developed.  ?HENT:  ?   Head: Atraumatic.  ?   Nose: Nose normal.  ?   Mouth/Throat:  ?   Mouth: Mucous membranes are moist.  ?   Pharynx: Oropharynx is clear.  ?Eyes:  ?   General: No scleral icterus. ?   Conjunctiva/sclera: Conjunctivae normal.  ?Neck:  ?   Trachea: No  tracheal deviation.  ?Cardiovascular:  ?   Rate and Rhythm: Normal rate and regular rhythm.  ?   Pulses: Normal pulses.  ?   Heart sounds: Normal heart sounds. No murmur heard. ?  No friction rub. No gallop.  ?Pulmonary:  ?   Effort: Pulmonary effort is normal. No accessory muscle usage or respiratory distress.  ?   Breath sounds: Normal breath sounds.  ?Abdominal:  ?   General: Bowel sounds are normal. There is no distension.  ?   Palpations: Abdomen is soft.  ?   Tenderness: There is no abdominal tenderness.  ?Genitourinary: ?   Comments: No cva tenderness. ?Musculoskeletal:     ?   General: No swelling.  ?   Cervical back: Normal range of motion and neck supple. No rigidity.  ?   Right lower  leg: No edema.  ?   Left lower leg: No edema.  ?Skin: ?   General: Skin is warm and dry.  ?   Findings: No rash.  ?Neurological:  ?   Mental Status: He is alert.  ?   Comments: Alert, speech clear.   ?Psychiatric:     ?   Mood and Affect: Mood normal.  ? ? ?ED Results / Procedures / Treatments   ?Labs ?(all labs ordered are listed, but only abnormal results are displayed) ?Results for orders placed or performed during the hospital encounter of 08/01/21  ?Basic metabolic panel  ?Result Value Ref Range  ? Sodium 138 135 - 145 mmol/L  ? Potassium 4.8 3.5 - 5.1 mmol/L  ? Chloride 104 98 - 111 mmol/L  ? CO2 28 22 - 32 mmol/L  ? Glucose, Bld 99 70 - 99 mg/dL  ? BUN 10 8 - 23 mg/dL  ? Creatinine, Ser 1.12 0.61 - 1.24 mg/dL  ? Calcium 9.6 8.9 - 10.3 mg/dL  ? GFR, Estimated >60 >60 mL/min  ? Anion gap 6 5 - 15  ?CBC  ?Result Value Ref Range  ? WBC 7.6 4.0 - 10.5 K/uL  ? RBC 5.85 (H) 4.22 - 5.81 MIL/uL  ? Hemoglobin 18.7 (H) 13.0 - 17.0 g/dL  ? HCT 54.9 (H) 39.0 - 52.0 %  ? MCV 93.8 80.0 - 100.0 fL  ? MCH 32.0 26.0 - 34.0 pg  ? MCHC 34.1 30.0 - 36.0 g/dL  ? RDW 12.4 11.5 - 15.5 %  ? Platelets 224 150 - 400 K/uL  ? nRBC 0.0 0.0 - 0.2 %  ?Brain natriuretic peptide  ?Result Value Ref Range  ? B Natriuretic Peptide 26.3 0.0 - 100.0 pg/mL  ?Troponin I (High Sensitivity)  ?Result Value Ref Range  ? Troponin I (High Sensitivity) 162 (HH) <18 ng/L  ?Troponin I (High Sensitivity)  ?Result Value Ref Range  ? Troponin I (High Sensitivity) 169 (HH) <18 ng/L  ? ?DG Chest 2 View ? ?Result Date: 08/01/2021 ?CLINICAL DATA:  Shortness of breath. Additional history provided: Shortness of breath and decreased emanate for 1 year. High blood pressure. EXAM: CHEST - 2 VIEW COMPARISON:  Prior chest radiographs 01/31/2020 and earlier. FINDINGS: Heart size within normal limits. Small ill-defined opacity within the left lung base appreciated on the PA radiograph only. No appreciable airspace consolidation within the right lung. No evidence of pleural  effusion or pneumothorax. No acute bony abnormality identified. Degenerative changes of the spine. IMPRESSION: Small ill-defined opacity within the left lung base. This may reflect atelectasis, scarring or pneumonia. Clinical correlation is recommended. Additionally, short-interval follow-up PA and lateral chest radiographs (  in 3-4 weeks time) are recommended to assess for resolution and to exclude underlying malignancy. If this finding persists at time of radiographic follow-up, a chest CT is recommended for further evaluation. Electronically Signed   By: Kellie Simmering D.O.   On: 08/01/2021 10:53   ? ? ?EKG ?EKG Interpretation ? ?Date/Time:  Thursday August 01 2021 10:13:56 EDT ?Ventricular Rate:  81 ?PR Interval:  174 ?QRS Duration: 86 ?QT Interval:  380 ?QTC Calculation: 441 ?R Axis:   100 ?Text Interpretation: Normal sinus rhythm Rightward axis Confirmed by Lajean Saver [phone]) on 08/01/2021 10:23:45 AM ? ?Radiology ?DG Chest 2 View ? ?Result Date: 08/01/2021 ?CLINICAL DATA:  Shortness of breath. Additional history provided: Shortness of breath and decreased emanate for 1 year. High blood pressure. EXAM: CHEST - 2 VIEW COMPARISON:  Prior chest radiographs 01/31/2020 and earlier. FINDINGS: Heart size within normal limits. Small ill-defined opacity within the left lung base appreciated on the PA radiograph only. No appreciable airspace consolidation within the right lung. No evidence of pleural effusion or pneumothorax. No acute bony abnormality identified. Degenerative changes of the spine. IMPRESSION: Small ill-defined opacity within the left lung base. This may reflect atelectasis, scarring or pneumonia. Clinical correlation is recommended. Additionally, short-interval follow-up PA and lateral chest radiographs (in 3-4 weeks time) are recommended to assess for resolution and to exclude underlying malignancy. If this finding persists at time of radiographic follow-up, a chest CT is recommended for further  evaluation. Electronically Signed   By: Kellie Simmering D.O.   On: 08/01/2021 10:53   ? ?Procedures ?Procedures  ? ? ?Medications Ordered in ED ?Medications - No data to display ? ?ED Course/ Medical Decision Making/ A&P ?

## 2021-08-01 NOTE — Progress Notes (Signed)
ANTICOAGULATION CONSULT NOTE ? ?Pharmacy Consult for heparin ?Indication: chest pain/ACS ? ?Heparin Dosing Weight: 87.9 kg ? ?Labs: ?Recent Labs  ?  08/01/21 ?1028  ?HGB 18.7*  ?HCT 54.9*  ?PLT 224  ?CREATININE 1.12  ? ? ? ?Assessment: ?77 yom presenting with CP/SOB, elevated troponin. Pharmacy consulted to dose heparin for ACS. Patient is not on anticoagulation PTA. Hg 18.7, plt wnl. No bleed issues reported. ? ?Pt is s/p cath with multivessels dz. Plan to resume heparin 8 hr post sheath removal ~1600 ? ?Goal of Therapy:  ?Heparin level 0.3-0.7 units/ml ?Monitor platelets by anticoagulation protocol: Yes ?  ?Plan:  ?Resume heparin at 1350 units/hr ?Check 6hr heparin level ?Monitor daily CBC, s/sx bleeding ?F/u further plan ? ?Onnie Boer, PharmD, BCIDP, AAHIVP, CPP ?Infectious Disease Pharmacist ?08/01/2021 4:15 PM ? ? ? ?

## 2021-08-01 NOTE — H&P (Addendum)
Cardiology Admission History and Physical:   Patient ID: Antonio [name] MRN: [id number]; DOB: 02/12/1961   Admission date: 08/01/2021  PCP:  Bridgette Habermann, NP ([facility])    [facility] Providers Cardiologist:  None      Chief Complaint:  Chest pain, SOB  Patient Profile:   Antonio [name] is a 61 y.o. male with history of actinic keratosis who is being seen 08/01/2021 for the evaluation of chest pain and SOB.  History of Present Illness:   Antonio [name] is a 62 year old male with above medical history. Per chart review, patient has no known cardiac history and has never seen a cardiologist. However, he does have a strong family history of CAD, with his parents and siblings all having CAD.    Patient presented to the ED on 3/23 complaining of SOB and chest discomfort intermittently for the past several weeks. Symptoms are worse with exertion, improve with rest, and last several minutes. Labs in the ED showed Na 138, K 4.8, creatinine 1.12, hemoglobin 18.7, WBC 7.6. Iniital hsTn 162>>169. BNP 26.3. EKG showed normal sinus rhythm. CXR showed a small, ill-defined opacity within the left lung base (possible atelectasis, scarring, PNA)   On interview, patient reports that he has been having chest discomfort and SOB on exertion for several weeks. On Sunday 3/19, patient started to develop chest tightness and SOB on minimal exertion. Noted that the discomfort was more noticeable than it had been in the past, and came on with very low levels of exertion. Went to his PCP on Monday 3/20 and was found to have elevated troponins. Patient denies any history of heart problems, but he does have a significant family history of CAD with both of his parents having heart attacks, and all of his siblings having blockages (one with CABG in his 41s). Patient has known high cholesterol and high blood pressure, not on any medications but managed with lifestyle changes. Does not smoke or use tobacco products,  average diet.   On interview, patient continues to have some chest discomfort, but not as severe as the episode on 3/19.    Past Medical History:  Diagnosis Date   Basal cell carcinoma 03/23/2018   right upper lip/inf nasolabial   Basal cell carcinoma 11/23/2019   Right Spinal Upper Back, [facility]    History reviewed. No pertinent surgical history.   Medications Prior to Admission: Prior to Admission medications   Medication Sig Start Date End Date Taking? Authorizing Provider  acetaminophen (TYLENOL) 500 MG tablet Take 1,000 mg by mouth every 6 (six) hours as needed for moderate pain.   Yes [provider]  Ascorbic Acid (VITAMIN C) 1000 MG tablet Take 1,000 mg by mouth daily.   Yes [provider]  aspirin EC 81 MG tablet Take 324 mg by mouth daily as needed (chest discomfort). Swallow whole.   Yes [provider]  cholecalciferol (VITAMIN D3) 25 MCG (1000 UNIT) tablet Take 1,000 Units by mouth daily.   Yes [provider]  ibuprofen (ADVIL) 200 MG tablet Take 400 mg by mouth every 6 (six) hours as needed for moderate pain.   Yes [provider]  Multiple Vitamins-Minerals (LIVER DETOX PO) Take 1 tablet by mouth daily.   Yes [provider]  naproxen sodium (ALEVE) 220 MG tablet Take 220 mg by mouth daily as needed (pain).   Yes [provider]  testosterone cypionate (DEPOTESTOSTERONE CYPIONATE) 200 MG/ML injection SMARTSIG:Milliliter(s) IM Patient not taking: Reported on  08/01/2021 04/30/21   [provider]     Allergies:   No Known Allergies  Social History:   Social History   Socioeconomic History   Marital status: Married    Spouse name: Not on file   Number of children: Not on file   Years of education: Not on file   Highest education level: Not on file  Occupational History   Not on file  Tobacco Use   Smoking status: Former   Smokeless tobacco: Never  Substance and Sexual Activity   Alcohol use:  Not on file   Drug use: Not on file   Sexual activity: Not on file  Other Topics Concern   Not on file  Social History Narrative   Not on file   Social Determinants of Health   Financial Resource Strain: Not on file  Food Insecurity: Not on file  Transportation Needs: Not on file  Physical Activity: Not on file  Stress: Not on file  Social Connections: Not on file  Intimate Partner Violence: Not on file    Family History:   The patient's family history is not on file.    ROS:  Please see the history of present illness.  All other ROS reviewed and negative.     Physical Exam/Data:   Vitals:   08/01/21 1145 08/01/21 1200 08/01/21 1230 08/01/21 1300  BP:  (!) 141/88 (!) 145/103 (!) 158/93  Pulse: 67 65 68 72  Resp: 11 (!) 7 (!) 9 17  Temp:      TempSrc:      SpO2: 95% 96% 96% 97%  Weight:      Height:       No intake or output data in the 24 hours ending 08/01/21 1337    08/01/2021   10:17 AM 01/31/2020    5:14 PM  Last 3 Weights  Weight (lbs) 206 lb 210 lb  Weight (kg) 93.441 kg 95.255 kg     Body mass index is 31.32 kg/m.  General:  Well nourished, well developed, in no acute distress HEENT: normal Neck: no JVD Vascular: Radial pulses 2+ bilaterally   Cardiac:  normal S1, S2; RRR; no murmur Lungs:  clear to auscultation bilaterally, no wheezing, rhonchi or rales  Abd: soft, nontender, no hepatomegaly  Ext: no edema Musculoskeletal:  No deformities, BUE and BLE strength normal and equal Skin: warm and dry  Neuro:  CNs 2-12 intact, no focal abnormalities noted Psych:  Normal affect    EKG:  The ECG that was done 3/23 was personally reviewed and demonstrates normal sinus rhythm   Relevant CV Studies:   Laboratory Data:  High Sensitivity Troponin:   Recent Labs  Lab 08/01/21 1028 08/01/21 1220  TROPONINIHS 162* 169*      Chemistry Recent Labs  Lab 08/01/21 1028  NA 138  K 4.8  CL 104  CO2 28  GLUCOSE 99  BUN 10  CREATININE 1.12  CALCIUM  9.6  GFRNONAA >60  ANIONGAP 6    No results for input(s): PROT, ALBUMIN, AST, ALT, ALKPHOS, BILITOT in the last 168 hours. Lipids No results for input(s): CHOL, TRIG, HDL, LABVLDL, LDLCALC, CHOLHDL in the last 168 hours. Hematology Recent Labs  Lab 08/01/21 1028  WBC 7.6  RBC 5.85*  HGB 18.7*  HCT 54.9*  MCV 93.8  MCH 32.0  MCHC 34.1  RDW 12.4  PLT 224   Thyroid No results for input(s): TSH, FREET4 in the last 168 hours. BNP Recent Labs  Lab  08/01/21 1110  BNP 26.3    DDimer No results for input(s): DDIMER in the last 168 hours.   Radiology/Studies:  DG Chest 2 View  Result Date: 08/01/2021 CLINICAL DATA:  Shortness of breath. Additional history provided: Shortness of breath and decreased emanate for 1 year. High blood pressure. EXAM: CHEST - 2 VIEW COMPARISON:  Prior chest radiographs 01/31/2020 and earlier. FINDINGS: Heart size within normal limits. Small ill-defined opacity within the left lung base appreciated on the PA radiograph only. No appreciable airspace consolidation within the right lung. No evidence of pleural effusion or pneumothorax. No acute bony abnormality identified. Degenerative changes of the spine. IMPRESSION: Small ill-defined opacity within the left lung base. This may reflect atelectasis, scarring or pneumonia. Clinical correlation is recommended. Additionally, short-interval follow-up PA and lateral chest radiographs (in 3-4 weeks time) are recommended to assess for resolution and to exclude underlying malignancy. If this finding persists at time of radiographic follow-up, a chest CT is recommended for further evaluation. Electronically Signed   By: Jackey Loge D.O.   On: 08/01/2021 10:53     Assessment and Plan:   NSTEMI - Patient complained of chest pain/SOB on exertion for the past several weeks. Noted more intense chest discomfort and SOB on minimal exertion on 3/19. Troponin reported to be elevated on 3/20 - hsTn in ED 162>>169 - EKG without  ischemic changes  - Ordered echocardiogram  - Patient started on IV heparin  - Plan for LHC. Has not eaten since 8 AM today. Able to schedule today.  - Order A1C and lipid panel  - Start metoprolol 25 mg BID  - Start lipitor 80 mg  - Daily ASA   Abnormal Chest X-ray  - CXR in the ED showed an ill-defined opacity within the left lung base. Recommended follow up CXR in 3-4 weeks  - Explained findings and recommendations to the patient and family  Risk Assessment/Risk Scores:    TIMI Risk Score for Unstable Angina or Non-ST Elevation MI:   The patient's TIMI risk score is 3, which indicates a 13% risk of all cause mortality, new or recurrent myocardial infarction or need for urgent revascularization in the next 14 days.{  Severity of Illness: The appropriate patient status for this patient is OBSERVATION. Observation status is judged to be reasonable and necessary in order to provide the required intensity of service to ensure the patient's safety. The patient's presenting symptoms, physical exam findings, and initial radiographic and laboratory data in the context of their medical condition is felt to place them at decreased risk for further clinical deterioration. Furthermore, it is anticipated that the patient will be medically stable for discharge from the hospital within 2 midnights of admission.    For questions or updates, please contact [facility] Please consult [email or web address] for contact info under     Signed, Rilyn Delker, PA-C  08/01/2021 1:37 PM   Personally seen and examined. Agree with above.  61 year old with non-ST elevation myocardial infarction, mildly elevated troponin in the 100s with strong family history hyperlipidemia chest tightness and shortness of breath with minimal exertion.  Pleasant, wife and daughter at bedside.  Daughter is a Engineer, civil (consulting).  Normal respiratory rate regular rate and rhythm no murmurs no edema well-perfused.  EKG without any significant  ischemic changes.  Assessment and plan  Non-ST elevation myocardial infarction - Proceed with heart catheterization.  Risks and benefits including stroke heart attack death renal impairment bleeding have been discussed.  He is willing to proceed.  Questions answered.  Wife present as well as daughter for discussion. -Check echocardiogram, goal-directed medical therapy aspirin statin beta-blocker as tolerated.  Abnormal chest x-ray - Ill-defined opacity in the left lung base.  They recommended follow-up chest x-ray in 3 to 4 weeks.  This was relayed to the family.  Donato Schultz, MD

## 2021-08-01 NOTE — TOC Initial Note (Signed)
Transition of Care (TOC) - Initial/Assessment Note  ? ? ?Patient Details  ?Name: Antonio [name] ?MRN: [id number] ?Date of Birth: 1961-01-08 ? ?Transition of Care (TOC) CM/SW Contact:    ?Verdell Carmine, RN ?Phone Number: ?08/01/2021, 2:09 PM ? ?Clinical Narrative:                 ? ?The Transition of Care Department [facility]) has reviewed patient and no TOC needs have been identified at this time. We will continue to monitor patient advancement through interdisciplinary progression rounds. If new patient transition needs arise, please place a TOC consult ?  ?  ? ? ?Patient Goals and CMS Choice ?  ?  ?  ? ?Expected Discharge Plan and Services ?  ?  ?  ?  ?  ?                ?  ?  ?  ?  ?  ?  ?  ?  ?  ?  ? ?Prior Living Arrangements/Services ?  ?  ?  ?       ?  ?  ?  ?  ? ?Activities of Daily Living ?  ?  ? ?Permission Sought/Granted ?  ?  ?   ?   ?   ?   ? ?Emotional Assessment ?  ?  ?  ?  ?  ?  ? ?Admission diagnosis:  chest discomfort/abn labs/cardiac issues ?Patient Active Problem List  ? Diagnosis Date Noted  ? Basal cell carcinoma 03/23/2018  ? ?PCP:  Bryson Corona, NP ?Pharmacy:   ?[facility] ?[place] [address] ?Phone: [phone] Fax: [phone] ? ? ? ? ?Social Determinants of Health (SDOH) Interventions ?  ? ?Readmission Risk Interventions ?   ? View : No data to display.  ?  ?  ?  ? ? ? ?

## 2021-08-01 NOTE — Progress Notes (Signed)
Patient arrived to room 6E03 in NAD, VS stable and patient free from pain. Patient oriented to room and call bell in reach. Family at bedside.  ?

## 2021-08-01 NOTE — Progress Notes (Signed)
ANTICOAGULATION CONSULT NOTE ? ?Pharmacy Consult for heparin ?Indication: chest pain/ACS ? ?Heparin Dosing Weight: 87.9 kg ? ?Labs: ?Recent Labs  ?  08/01/21 ?1028  ?HGB 18.7*  ?HCT 54.9*  ?PLT 224  ?CREATININE 1.12  ? ? ?Assessment: ?70 yom presenting with CP/SOB, elevated troponin. Pharmacy consulted to dose heparin for ACS. Patient is not on anticoagulation PTA. Hg 18.7, plt wnl. No bleed issues reported. ? ?Goal of Therapy:  ?Heparin level 0.3-0.7 units/ml ?Monitor platelets by anticoagulation protocol: Yes ?  ?Plan:  ?Heparin 4000 unit bolus ?Start heparin at 1200 units/hr ?Check 6hr heparin level ?Monitor daily CBC, s/sx bleeding ?F/u Cardiology plans ? ? ?Arturo Morton, PharmD, BCPS ?Clinical Pharmacist ?08/01/2021 12:30 PM ? ?

## 2021-08-02 ENCOUNTER — Encounter (HOSPITAL_COMMUNITY): Payer: Self-pay | Admitting: Interventional Cardiology

## 2021-08-02 ENCOUNTER — Observation Stay (HOSPITAL_COMMUNITY): Payer: Self-pay

## 2021-08-02 DIAGNOSIS — I25110 Atherosclerotic heart disease of native coronary artery with unstable angina pectoris: Secondary | ICD-10-CM

## 2021-08-02 DIAGNOSIS — I2510 Atherosclerotic heart disease of native coronary artery without angina pectoris: Secondary | ICD-10-CM

## 2021-08-02 DIAGNOSIS — Z01810 Encounter for preprocedural cardiovascular examination: Secondary | ICD-10-CM

## 2021-08-02 DIAGNOSIS — I214 Non-ST elevation (NSTEMI) myocardial infarction: Secondary | ICD-10-CM

## 2021-08-02 LAB — ECHOCARDIOGRAM COMPLETE
AR max vel: 2.29 cm2
AV Peak grad: 11.4 mmHg
Ao pk vel: 1.69 m/s
Area-P 1/2: 5.2 cm2
Height: 68 in
S' Lateral: 2.7 cm
Weight: 3296 oz

## 2021-08-02 LAB — CBC
HCT: 54.6 % — ABNORMAL HIGH (ref 39.0–52.0)
Hemoglobin: 18.5 g/dL — ABNORMAL HIGH (ref 13.0–17.0)
MCH: 31.6 pg (ref 26.0–34.0)
MCHC: 33.9 g/dL (ref 30.0–36.0)
MCV: 93.2 fL (ref 80.0–100.0)
Platelets: 236 10*3/uL (ref 150–400)
RBC: 5.86 MIL/uL — ABNORMAL HIGH (ref 4.22–5.81)
RDW: 12.6 % (ref 11.5–15.5)
WBC: 8.9 10*3/uL (ref 4.0–10.5)
nRBC: 0 % (ref 0.0–0.2)

## 2021-08-02 LAB — HEPARIN LEVEL (UNFRACTIONATED)
Heparin Unfractionated: 0.24 IU/mL — ABNORMAL LOW (ref 0.30–0.70)
Heparin Unfractionated: 0.26 IU/mL — ABNORMAL LOW (ref 0.30–0.70)

## 2021-08-02 LAB — BASIC METABOLIC PANEL
Anion gap: 7 (ref 5–15)
BUN: 11 mg/dL (ref 8–23)
CO2: 25 mmol/L (ref 22–32)
Calcium: 9.3 mg/dL (ref 8.9–10.3)
Chloride: 105 mmol/L (ref 98–111)
Creatinine, Ser: 1.04 mg/dL (ref 0.61–1.24)
GFR, Estimated: >60 mL/min (ref >60)
Glucose, Bld: 112 mg/dL — ABNORMAL HIGH (ref 70–99)
Potassium: 4.6 mmol/L (ref 3.5–5.1)
Sodium: 137 mmol/L (ref 135–145)

## 2021-08-02 LAB — LIPID PANEL
Cholesterol: 192 mg/dL (ref 0–200)
HDL: 42 mg/dL (ref >40)
LDL Cholesterol: 130 mg/dL — ABNORMAL HIGH (ref 0–99)
Total CHOL/HDL Ratio: 4.6 RATIO
Triglycerides: 102 mg/dL (ref <150)
VLDL: 20 mg/dL (ref 0–40)

## 2021-08-02 LAB — HEMOGLOBIN A1C
Hgb A1c MFr Bld: 5.6 % (ref 4.8–5.6)
Mean Plasma Glucose: 114.02 mg/dL

## 2021-08-02 NOTE — Progress Notes (Signed)
ANTICOAGULATION CONSULT NOTE ? ?Pharmacy Consult for heparin ?Indication: chest pain/ACS ? ?Heparin Dosing Weight: 87.9 kg ? ?Labs: ?Recent Labs  ?  08/01/21 ?1028 08/02/21 ?0804  ?HGB 18.7* 18.5*  ?HCT 54.9* 54.6*  ?PLT 224 236  ?HEPARINUNFRC  --  0.24*  ?CREATININE 1.12 1.04  ? ? ?Assessment: ?53 yom presenting with CP/SOB, elevated troponin. Pharmacy consulted to dose heparin for ACS. Patient is not on anticoagulation PTA. Hg 18.7, plt wnl. No bleed issues reported. ? 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 Heparin level low this morning. CBC stable overnight.  ? ?Goal of Therapy:  ?Heparin level 0.3-0.7 units/ml ?Monitor platelets by anticoagulation protocol: Yes ?  ?Plan:  ?Increase heparin to 1550 units/hr ?Recheck heparin level tonight ? ?Erin Hearing PharmD., BCPS ?Clinical Pharmacist ?08/02/2021 9:23 AM ? ?

## 2021-08-02 NOTE — Progress Notes (Signed)
? ?Progress Note ? ?Patient Name: Antonio [name] ?Date of Encounter: 08/02/2021 ? ?[facility] Cardiologist: Candee Furbish, MD  ? ?Subjective  ? ?No acute overnight events.  No chest pain.  Has triple-vessel disease.  Cardiothoracic surgery consultation. ? ?Inpatient Medications  ?  ?Scheduled Meds: ? vitamin C  1,000 mg Oral Daily  ? aspirin EC  81 mg Oral Daily  ? atorvastatin  80 mg Oral q1800  ? cholecalciferol  1,000 Units Oral Daily  ? metoprolol tartrate  25 mg Oral BID  ? sodium chloride flush  3 mL Intravenous Q12H  ? sodium chloride flush  3 mL Intravenous Q12H  ? ?Continuous Infusions: ? sodium chloride    ? heparin 1,350 Units/hr (08/02/21 0800)  ? ?PRN Meds: ?sodium chloride, acetaminophen, nitroGLYCERIN, ondansetron (ZOFRAN) IV, sodium chloride flush  ? ?Vital Signs  ?  ?Vitals:  ? 08/01/21 2037 08/01/21 2148 08/02/21 9604 08/02/21 0746  ?BP: 130/82 128/88 132/90   ?Pulse: 66 65 64 64  ?Resp: '15  13 14  '$ ?Temp: 98.8 ?F (37.1 ?C)  98.2 ?F (36.8 ?C)   ?TempSrc: Oral  Oral   ?SpO2: 95%  97% 96%  ?Weight:      ?Height:      ? ? ?Intake/Output Summary (Last 24 hours) at 08/02/2021 0954 ?Last data filed at 08/02/2021 0800 ?Gross per 24 hour  ?Intake 627.18 ml  ?Output 900 ml  ?Net -272.82 ml  ? ? ?  08/01/2021  ? 10:17 AM 01/31/2020  ?  5:14 PM  ?Last 3 Weights  ?Weight (lbs) 206 lb 210 lb  ?Weight (kg) 93.441 kg 95.255 kg  ?   ? ?Telemetry  ?  ?No adverse arrhythmias- Personally Reviewed ? ?ECG  ?  ?Normal sinus rhythm, rate 64 bpm, with no acute ST/T changes. Normal axis. Normal PR and QRS intervals. QTc 455 ms. - Personally Reviewed ? ?Physical Exam  ? ?GEN: No acute distress.   ?Neck: No JVD ?Cardiac: RRR, no murmurs, rubs, or gallops.  Cath site intact ?Respiratory: Clear to auscultation bilaterally. ?GI: Soft, nontender, non-distended  ?MS: No edema; No deformity. ?Neuro:  Nonfocal  ?Psych: Normal affect  ? ?Labs  ?  ?High Sensitivity Troponin:   ?Recent Labs  ?Lab 08/01/21 ?1028 08/01/21 ?1220  ?TROPONINIHS  162* 169*  ?   ?Chemistry ?Recent Labs  ?Lab 08/01/21 ?1028 08/02/21 ?0804  ?NA 138 137  ?K 4.8 4.6  ?CL 104 105  ?CO2 28 25  ?GLUCOSE 99 112*  ?BUN 10 11  ?CREATININE 1.12 1.04  ?CALCIUM 9.6 9.3  ?GFRNONAA >60 >60  ?ANIONGAP 6 7  ?  ?Lipids  ?Recent Labs  ?Lab 08/02/21 ?0804  ?CHOL 192  ?TRIG 102  ?HDL 42  ?LDLCALC 130*  ?CHOLHDL 4.6  ?  ?Hematology ?Recent Labs  ?Lab 08/01/21 ?1028 08/02/21 ?0804  ?WBC 7.6 8.9  ?RBC 5.85* 5.86*  ?HGB 18.7* 18.5*  ?HCT 54.9* 54.6*  ?MCV 93.8 93.2  ?MCH 32.0 31.6  ?MCHC 34.1 33.9  ?RDW 12.4 12.6  ?PLT 224 236  ? ?Thyroid No results for input(s): TSH, FREET4 in the last 168 hours.  ?BNP ?Recent Labs  ?Lab 08/01/21 ?1110  ?BNP 26.3  ?  ?DDimer No results for input(s): DDIMER in the last 168 hours.  ? ?Radiology  ?  ?DG Chest 2 View ? ?Result Date: 08/01/2021 ?CLINICAL DATA:  Shortness of breath. Additional history provided: Shortness of breath and decreased emanate for 1 year. High blood pressure. EXAM: CHEST - 2 VIEW  COMPARISON:  Prior chest radiographs 01/31/2020 and earlier. FINDINGS: Heart size within normal limits. Small ill-defined opacity within the left lung base appreciated on the PA radiograph only. No appreciable airspace consolidation within the right lung. No evidence of pleural effusion or pneumothorax. No acute bony abnormality identified. Degenerative changes of the spine. IMPRESSION: Small ill-defined opacity within the left lung base. This may reflect atelectasis, scarring or pneumonia. Clinical correlation is recommended. Additionally, short-interval follow-up PA and lateral chest radiographs (in 3-4 weeks time) are recommended to assess for resolution and to exclude underlying malignancy. If this finding persists at time of radiographic follow-up, a chest CT is recommended for further evaluation. Electronically Signed   By: Kellie Simmering D.O.   On: 08/01/2021 10:53  ? ?CARDIAC CATHETERIZATION ? ?Result Date: 08/01/2021 ?  Prox RCA lesion is 100% stenosed.   1st Mrg  lesion is 99% stenosed.   Prox Cx to Mid Cx lesion is 50% stenosed.   Mid LAD lesion is 75% stenosed.   2nd Diag lesion is 75% stenosed.   The left ventricular systolic function is normal.   LV end diastolic pressure is normal.   The left ventricular ejection fraction is 55-65% by visual estimate.   There is no aortic valve stenosis. Severe three vessel disease.  Normal LV function.  Plan for cardiac surgery consult.   ? ?Cardiac Studies  ? ?Left Cardiac Catheterization 08/01/2021: ?  Prox RCA lesion is 100% stenosed. ?  1st Mrg lesion is 99% stenosed. ?  Prox Cx to Mid Cx lesion is 50% stenosed. ?  Mid LAD lesion is 75% stenosed. ?  2nd Diag lesion is 75% stenosed. ?  The left ventricular systolic function is normal. ?  LV end diastolic pressure is normal. ?  The left ventricular ejection fraction is 55-65% by visual estimate. ?  There is no aortic valve stenosis. ?  ?Severe three vessel disease.  Normal LV function.  Plan for cardiac surgery consult.  ? ?Diagnostic ?Dominance: Right ? ?Preliminary echo with normal ejection fraction no significant valvular abnormalities ? ?Patient Profile  ?   ?61 y.o. male with actinic keratosis but known cardiac history prior to admission who was admitted on 08/01/2021 for NSTEMI after presenting with chest pain and shortness of breath. LHC showed 3 vessel CAD and CT surgery was consulted. ? ?Assessment & Plan  ?  ?NSTEMI ?Patient presented with chest pain and shortness of breath. High-sensitivity troponin 162 >> 169. LHC showed 3 vessel CAD so CT surgery has been consulted. Echo pending. ?- Continue IV Heparin. ?- Continue Aspirin '81mg'$  daily, Lopressor '25mg'$  twice daily, and Lipitor '80mg'$  daily. ?- Lipid panel and hemoglobin A1c pending. ?- CT surgery consult pending. ? ?Abnormal Chest X-Ray ?Chest x-ray in the ED showed an ill-defined opacity within the left lung base.  ?- Repeat chest x-ray recommend in 3-4 weeks. ?- Finding and recommendations have been relayed to family. ? ?For  questions or updates, please contact [place] ?Please consult [email or web address] for contact info under  ? ?  ?   ?Signed, ?Candee Furbish, MD  ?08/02/2021, 9:54 AM   ? ?

## 2021-08-02 NOTE — Progress Notes (Signed)
Pre-CABG Dopplers completed. ?Refer to "CV Proc" under chart review to view preliminary results. ? ?08/02/2021 11:51 AM ?Kelby Aline., MHA, RVT, RDCS, RDMS   ?

## 2021-08-02 NOTE — Progress Notes (Signed)
CARDIAC REHAB PHASE I  ? ?Preop education completed with pt. Pt given IS, able to demonstrate 2500 easily. Pt educated on importance of IS use, walks, and sternal precautions post-op. Pt given in-the-tube sheet along with cardiac surgery booklet and [facility] care guide. Questions and concerns addressed. Will continue to follow throughout his stay. ? ?[id number] ?Rufina Falco, RN BSN ?08/02/2021 ?2:00 PM ? ?

## 2021-08-02 NOTE — Consult Note (Signed)
? ?   ?[address] ?      [place] [address] ?            [phone]       ? ?Lashan K Livas ?[facility] Record #[id number] ?Date of Birth: Jul 25, 1960 ? ?Referring: No ref. provider found ?Primary Care: Bryson Corona, NP ?Primary Harrison, MD ? ?Chief Complaint:    ?Chief Complaint  ?Patient presents with  ? Shortness of Breath  ? Chest Pain  ? ? ?History of Present Illness:     ?61 year old male admitted with concern for an NSTEMI.  He was noted to have an elevated troponin on recent labs.  He does admit to recent history of exertional shortness of breath and some anginal symptoms.  Left heart cath shows severe three-vessel coronary artery disease. ? ? ?Past Medical and Surgical History: ?Previous Chest Surgery: No ?Previous Chest Radiation: No ?Diabetes Mellitus: No.  HbA1C 5.6 ?Creatinine: 1.04 ? ?Past Medical History:  ?Diagnosis Date  ? Basal cell carcinoma 03/23/2018  ? right upper lip/inf nasolabial  ? Basal cell carcinoma 11/23/2019  ? Right Spinal Upper Back, EDC  ? ? ?Past Surgical History:  ?Procedure Laterality Date  ? LEFT HEART CATH AND CORONARY ANGIOGRAPHY N/A 08/01/2021  ? Procedure: LEFT HEART CATH AND CORONARY ANGIOGRAPHY;  Surgeon: Jettie Booze, MD;  Location: [facility];  Service: Cardiovascular;  Laterality: N/A;  ? ? ? ? ?Social History  ? ?Tobacco Use  ?Smoking Status Former  ?Smokeless Tobacco Never  ?  ?Social History  ? ?Substance and Sexual Activity  ?Alcohol Use Yes  ? Alcohol/week: 10.0 standard drinks  ? Types: 10 Standard drinks or equivalent per week  ? ? ? ?No Known Allergies ? ? ? ?Current Facility-Administered Medications  ?Medication Dose Route Frequency Provider Last Rate Last Admin  ? 0.9 %  sodium chloride infusion  250 mL Intravenous PRN Jettie Booze, MD      ? acetaminophen (TYLENOL) tablet 650 mg  650 mg Oral Q4H PRN Ether, Wolters, PA-C   650 mg at 08/01/21 2152  ? ascorbic acid (VITAMIN C) tablet 1,000  mg  1,000 mg Oral Daily Jayjay, Littles, PA-C   1,000 mg at 08/02/21 9242  ? aspirin EC tablet 81 mg  81 mg Oral Daily Kyrell, Ruacho, PA-C   81 mg at 08/02/21 6834  ? atorvastatin (LIPITOR) tablet 80 mg  80 mg Oral q1800 Glynn, Yepes, PA-C   80 mg at 08/01/21 1650  ? cholecalciferol (VITAMIN D3) tablet 1,000 Units  1,000 Units Oral Daily Holmes, Hays, PA-C   1,000 Units at 08/02/21 1962  ? heparin ADULT infusion 100 units/mL (25000 units/221m)  1,550 Units/hr Intravenous Continuous WLyndee Leo RPH 15.5 mL/hr at 08/02/21 1034 1,550 Units/hr at 08/02/21 1034  ? metoprolol tartrate (LOPRESSOR) tablet 25 mg  25 mg Oral BID JAisea, Bouldin PA-C   25 mg at 08/02/21 1037  ? nitroGLYCERIN (NITROSTAT) SL tablet 0.4 mg  0.4 mg Sublingual Q5 Min x 3 PRN JMargie Billet PA-C      ? ondansetron ([facility] injection 4 mg  4 mg Intravenous Q6H PRN VJettie Booze MD      ? sodium chloride flush (NS) 0.9 % injection 3 mL  3 mL Intravenous Q12H JJabier, Deese PA-C   3 mL at 08/02/21 1035  ? sodium chloride flush (NS) 0.9 % injection 3 mL  3 mL Intravenous Q12H  Jettie Booze, MD   3 mL at 08/01/21 2139  ? sodium chloride flush (NS) 0.9 % injection 3 mL  3 mL Intravenous PRN Jettie Booze, MD      ? ? ?Medications Prior to Admission  ?Medication Sig Dispense Refill Last Dose  ? acetaminophen (TYLENOL) 500 MG tablet Take 1,000 mg by mouth every 6 (six) hours as needed for moderate pain.   Past Month  ? Ascorbic Acid (VITAMIN C) 1000 MG tablet Take 1,000 mg by mouth daily.   08/01/2021  ? aspirin EC 81 MG tablet Take 324 mg by mouth daily as needed (chest discomfort). Swallow whole.   08/01/2021  ? cholecalciferol (VITAMIN D3) 25 MCG (1000 UNIT) tablet Take 1,000 Units by mouth daily.   08/01/2021  ? ibuprofen (ADVIL) 200 MG tablet Take 400 mg by mouth every 6 (six) hours as needed for moderate pain.   Past Month  ? Multiple Vitamins-Minerals (LIVER DETOX PO) Take 1 tablet by  mouth daily.   08/01/2021  ? naproxen sodium (ALEVE) 220 MG tablet Take 220 mg by mouth daily as needed (pain).   Past Week  ? testosterone cypionate (DEPOTESTOSTERONE CYPIONATE) 200 MG/ML injection SMARTSIG:Milliliter(s) IM (Patient not taking: Reported on 08/01/2021)   Not Taking  ? ? ?No family history on file. ? ? ?Review of Systems:  ? ?Review of Systems  ?Constitutional:  Positive for malaise/fatigue. Negative for fever.  ?Respiratory:  Positive for shortness of breath. Negative for cough.   ?Cardiovascular:  Positive for chest pain. Negative for palpitations and leg swelling.  ?  ? ?Physical Exam: ?BP 114/82 (BP Location: Left Arm)   Pulse 66   Temp 98.2 ?F (36.8 ?C) (Oral)   Resp 19   Ht '5\' 8"'$  (1.727 m)   Wt 93.4 kg   SpO2 95%   BMI 31.32 kg/m?  ?Physical Exam ?Constitutional:   ?   General: He is not in acute distress. ?   Appearance: He is well-developed and normal weight. He is not ill-appearing.  ?Eyes:  ?   Extraocular Movements: Extraocular movements intact.  ?Pulmonary:  ?   Effort: Pulmonary effort is normal.  ?Musculoskeletal:     ?   General: Normal range of motion.  ?   Cervical back: Normal range of motion.  ?   Right lower leg: No edema.  ?   Left lower leg: No edema.  ?Skin: ?   General: Skin is warm and dry.  ?Neurological:  ?   General: No focal deficit present.  ?   Mental Status: He is alert and oriented to person, place, and time.  ?  ? ? ?Diagnostic Studies & Laboratory data: ?   ?Left Heart Catherization: ?Diagnostic ?Dominance: Right ? ? ?EKG: ?sinus ?I have independently reviewed the above radiologic studies and discussed with the patient  ? ?Recent Lab Findings: ?Lab Results  ?Component Value Date  ? WBC 8.9 08/02/2021  ? HGB 18.5 (H) 08/02/2021  ? HCT 54.6 (H) 08/02/2021  ? PLT 236 08/02/2021  ? GLUCOSE 112 (H) 08/02/2021  ? CHOL 192 08/02/2021  ? TRIG 102 08/02/2021  ? HDL 42 08/02/2021  ? LDLDIRECT 167.2 04/21/2006  ? LDLCALC 130 (H) 08/02/2021  ? ALT 42 01/31/2020  ? AST 40  01/31/2020  ? NA 137 08/02/2021  ? K 4.6 08/02/2021  ? CL 105 08/02/2021  ? CREATININE 1.04 08/02/2021  ? BUN 11 08/02/2021  ? CO2 25 08/02/2021  ? TSH 1.23 04/21/2006  ? HGBA1C  5.6 08/02/2021  ? ? ? ? ?Assessment / Plan:   ?61 yo male who presents with NSTEMI.  3V CAD, preserved biventricular function, and no significant valvular disease.  We discussed the risks and benefits of surgical revascularization, and he is agreeable to proceed.  Use left radial artery as one of the conduits. ? ?CABG 4 on 3/28 ? ? ? ? ?I  spent 30 minutes counseling the patient face to face. ? ? ?Lucile Crater Laura-Lee Villegas ?08/02/2021 10:37 AM ? ? ? ? ? ? ?

## 2021-08-02 NOTE — Progress Notes (Signed)
Mobility Specialist Progress Note  ? ? 08/02/21 1249  ?Mobility  ?Activity Ambulated independently in hallway  ?Level of Assistance Modified independent, requires aide device or extra time  ?Assistive Device  ?(IV pole)  ?Distance Ambulated (ft) 800 ft  ?Activity Response Tolerated well  ?$Mobility charge 1 Mobility  ? ?Pre-Mobility: 62 HR ?During Mobility: 75 HR ?Post-Mobility: 65 HR ? ?Pt received in bed and agreeable. No complaints. Returned to bed with call bell in reach and visitor present.  ? ?Antonio [name] ?Mobility Specialist  ?  ?

## 2021-08-02 NOTE — Progress Notes (Signed)
ANTICOAGULATION CONSULT NOTE ? ?Pharmacy Consult for heparin ?Indication: chest pain/ACS ? ?Heparin Dosing Weight: 87.9 kg ? ?Labs: ?Recent Labs  ?  08/01/21 ?1028 08/02/21 ?0804 08/02/21 ?1840  ?HGB 18.7* 18.5*  --   ?HCT 54.9* 54.6*  --   ?PLT 224 236  --   ?HEPARINUNFRC  --  0.24* 0.26*  ?CREATININE 1.12 1.04  --   ? ? ? ?Assessment: ?35 yom presenting with CP/SOB, elevated troponin. Pharmacy consulted to dose heparin for ACS.  Patient is s/p cath found to have multivessel disease with plan for CABG on 3/28.   ? ?Heparin level remains low at 0.26 units/mL.  No issue with heparin infusion nor bleeding per RN. ? ?Goal of Therapy:  ?Heparin level 0.3-0.7 units/ml ?Monitor platelets by anticoagulation protocol: Yes ?  ?Plan:  ?Increase heparin to 1800 units/hr ?Check 6 hr heparin level ? ?Jawara Latorre D. Mina Marble, PharmD, BCPS, BCCCP ?08/02/2021, 7:36 PM ? ? ?

## 2021-08-03 DIAGNOSIS — E785 Hyperlipidemia, unspecified: Secondary | ICD-10-CM

## 2021-08-03 DIAGNOSIS — I249 Acute ischemic heart disease, unspecified: Secondary | ICD-10-CM

## 2021-08-03 LAB — HEPARIN LEVEL (UNFRACTIONATED)
Heparin Unfractionated: 0.71 IU/mL — ABNORMAL HIGH (ref 0.30–0.70)
Heparin Unfractionated: 0.7 IU/mL (ref 0.30–0.70)

## 2021-08-03 LAB — CBC
HCT: 50.3 % (ref 39.0–52.0)
Hemoglobin: 17.4 g/dL — ABNORMAL HIGH (ref 13.0–17.0)
MCH: 32.2 pg (ref 26.0–34.0)
MCHC: 34.6 g/dL (ref 30.0–36.0)
MCV: 93.1 fL (ref 80.0–100.0)
Platelets: 198 10*3/uL (ref 150–400)
RBC: 5.4 MIL/uL (ref 4.22–5.81)
RDW: 12.6 % (ref 11.5–15.5)
WBC: 8.3 10*3/uL (ref 4.0–10.5)
nRBC: 0 % (ref 0.0–0.2)

## 2021-08-03 NOTE — Progress Notes (Signed)
? 08/03/21 1700  ?Clinical Encounter Type  ?Visited With Patient and family together ?(Wife: Ethin Drummond [phone])  ?Visit Type Initial;Pre-op ?Market researcher Education)  ?Referral From Nurse ?(Brooke B. Raphael Gibney, Therapist, sports)  ?Consult/Referral To Chaplain ?Albertina Parr [place])  ?Advance Directives (For Healthcare)  ?Does Patient Have a Medical Advance Directive? No  ?Would patient like information on creating a medical advance directive? Yes (Inpatient - patient requests chaplain consult to create a medical advance directive)  ?Mental Health Advance Directives  ?Does Patient Have a Mental Health Advance Directive? Yes  ?Does patient want to make changes to mental health advance directive? Yes (Inpatient - patient requests chaplain consult to change a mental health advance directive)  ? ?Chaplain responded to a consult request placed by Brooke B. Raphael Gibney, Therapist, sports, for [employer] education. ? ?Chaplain provided the Advance Directive packet as well as education on Advance Directives-documents an individual completes to communicate their health care directions in advance of a time when they may need them to Mr. Deavon Podgorski. Kroon and his wife Nirav Sweda and daughter. Chaplain informed Mr. Carver the documents which may be completed here in the hospital are the Living Will and [place]. ? ?Chaplain informed that the [place] is a legal document in which an individual names another person, their [place], to make health care decisions when the individual is not able to make them for themselves. The Health Care Agent's function can be temporary or permanent depending on his ability to make and communicate those decisions independently. Chaplain informed Mr. Eisman in the absence of a [place], the state of [place] directs health care providers to look to the following individuals in the order listed: legal guardian; an attorney-in-fact under a  general power of attorney (POA) if that POA includes the right to make health care decisions; his wife, Thierry Dobosz; a majority of his children; a 74 of adult brothers and sisters; or an individual who has an established relationship with you, who is acting in good faith and who can convey your wishes.  If none of these person are available or willing to make medical decisions on a patient's behalf, the law allows the patient's doctor to make decisions for them as long as another doctor agrees with those decisions.  Chaplain also informed the patient that the Health Care agent has no decision-making authority over any affairs other than those related to his medical care. ? ?The chaplain further educated Mr. Mccallister that a Living Will is a legal document that allows his desire not to receive life-prolonging measures in the event that they have a condition that is incurable and will result in his death in a short period of time; they are unconscious, and doctors are confident that they will not regain consciousness; and/or they have advanced dementia or other substantial and irreversible loss of mental function. The chaplain informed Mr. Hansman that life-prolonging measures are medical treatments that would only serve to postpone death, including breathing machines, kidney dialysis, antibiotics, artificial nutrition and hydration (tube feeding), and similar forms of treatment and that if an individual is able to express their wishes, they may also make them known without the use of a Living Will, but in the event that he is not able to express his wishes, a Living Will allows medical providers and the his family and friends to ensure that they are not making decisions on the his behalf, but rather serving as  the his voice to convey decisions the he has already made. ? ?Mr. Callicott is aware that the decision to create an advance directive is his alone and he may choose not to complete the documents or may choose to  complete one portion or both.  Mr. Verga was informed that he can revoke the documents at any time by striking through them and writing void or by completing new documents, but that it is also advisable that the individual verbally notify interested parties that their wishes have changed. ? ?Mr. Garvey is also aware that the document must be signed in the presence of a notary public and two witnesses and that this can be done while the patient is still admitted to the hospital or after discharge in the community. If they decide to complete Advance Directives after being discharged from the hospital, they have been advised to notify all interested parties and to provide those documents to their physicians and loved ones in addition to bringing them to the hospital in the event of another hospitalization. ? ?The chaplain informed the Mr. Marinos that if he desires to proceed with completing Advance Directive Documentation while he is still admitted, notary services are typically available at [facility] between the hours of 1:00 and 3:30 Monday-Thursday. ? ?When the patient is ready to have these documents completed, the patient should request that their nurse place a spiritual care consult and indicate that the patient is ready to have their advance directives notarized so that arrangements for witnesses and notary public can be made. ? ?Please page spiritual care if the patient desires further education or has questions. ? ?Susanne Greenhouse., [phone] ?

## 2021-08-03 NOTE — Progress Notes (Signed)
ANTICOAGULATION CONSULT NOTE ? ?Pharmacy Consult for heparin ?Indication: chest pain/ACS ? ?Heparin Dosing Weight: 87.9 kg ? ?Labs: ?Recent Labs  ?  08/01/21 ?1028 08/02/21 ?0804 08/02/21 ?1840 08/03/21 ?0252  ?HGB 18.7* 18.5*  --  17.4*  ?HCT 54.9* 54.6*  --  50.3  ?PLT 224 236  --  198  ?HEPARINUNFRC  --  0.24* 0.26* 0.71*  ?CREATININE 1.12 1.04  --   --   ? ? ? ?Assessment: ?56 yom presenting with CP/SOB, elevated troponin. Pharmacy consulted to dose heparin for ACS.  Patient is s/p cath found to have multivessel disease with plan for CABG on 3/28.   ? ?3/25 AM update:  ?Heparin level just above goal ? ?Goal of Therapy:  ?Heparin level 0.3-0.7 units/ml ?Monitor platelets by anticoagulation protocol: Yes ?  ?Plan:  ?Dec heparin to 1700 units/hr ?1200 heparin level ? ?Narda Bonds, PharmD, BCPS ?Clinical Pharmacist ?Phone: [phone] ? ? ?

## 2021-08-03 NOTE — Progress Notes (Signed)
? ?Progress Note ? ?Patient Name: Antonio [name] ?Date of Encounter: 08/03/2021 ? ?[facility] Cardiologist: Candee Furbish, MD  ? ?Subjective  ? ?No acute overnight events.  No chest pain.  BP 133/82 ? ?Inpatient Medications  ?  ?Scheduled Meds: ? vitamin C  1,000 mg Oral Daily  ? aspirin EC  81 mg Oral Daily  ? atorvastatin  80 mg Oral q1800  ? cholecalciferol  1,000 Units Oral Daily  ? metoprolol tartrate  25 mg Oral BID  ? sodium chloride flush  3 mL Intravenous Q12H  ? sodium chloride flush  3 mL Intravenous Q12H  ? ?Continuous Infusions: ? sodium chloride    ? heparin 1,700 Units/hr (08/03/21 0742)  ? ?PRN Meds: ?sodium chloride, acetaminophen, nitroGLYCERIN, ondansetron (ZOFRAN) IV, sodium chloride flush  ? ?Vital Signs  ?  ?Vitals:  ? 08/02/21 1200 08/02/21 1755 08/02/21 2046 08/03/21 0610  ?BP:   127/82 133/82  ?Pulse:   64 (!) 59  ?Resp: '18 17 16 '$ (!) 9  ?Temp:   98.3 ?F (36.8 ?C) 98 ?F (36.7 ?C)  ?TempSrc:   Oral Oral  ?SpO2:   97% 97%  ?Weight:      ?Height:      ? ? ?Intake/Output Summary (Last 24 hours) at 08/03/2021 0746 ?Last data filed at 08/03/2021 [phone] ?Gross per 24 hour  ?Intake 740.83 ml  ?Output --  ?Net 740.83 ml  ? ? ? ?  08/01/2021  ? 10:17 AM 01/31/2020  ?  5:14 PM  ?Last 3 Weights  ?Weight (lbs) 206 lb 210 lb  ?Weight (kg) 93.441 kg 95.255 kg  ?   ? ?Telemetry  ?  ?No adverse arrhythmias- Personally Reviewed ? ?ECG  ?  ?Normal sinus rhythm, rate 61, no ST abnormalities- Personally Reviewed ? ?Physical Exam  ? ?GEN: No acute distress.   ?Neck: No JVD ?Cardiac: RRR, no murmurs, rubs, or gallops.  Cath site intact ?Respiratory: Clear to auscultation bilaterally. ?GI: Soft, nontender, non-distended  ?MS: No edema; No deformity. ?Neuro:  Nonfocal  ?Psych: Normal affect  ? ?Labs  ?  ?High Sensitivity Troponin:   ?Recent Labs  ?Lab 08/01/21 ?1028 08/01/21 ?1220  ?TROPONINIHS 162* 169*  ? ?   ?Chemistry ?Recent Labs  ?Lab 08/01/21 ?1028 08/02/21 ?0804  ?NA 138 137  ?K 4.8 4.6  ?CL 104 105  ?CO2 28 25   ?GLUCOSE 99 112*  ?BUN 10 11  ?CREATININE 1.12 1.04  ?CALCIUM 9.6 9.3  ?GFRNONAA >60 >60  ?ANIONGAP 6 7  ? ?  ?Lipids  ?Recent Labs  ?Lab 08/02/21 ?0804  ?CHOL 192  ?TRIG 102  ?HDL 42  ?LDLCALC 130*  ?CHOLHDL 4.6  ? ?  ?Hematology ?Recent Labs  ?Lab 08/01/21 ?1028 08/02/21 ?0804 08/03/21 ?0252  ?WBC 7.6 8.9 8.3  ?RBC 5.85* 5.86* 5.40  ?HGB 18.7* 18.5* 17.4*  ?HCT 54.9* 54.6* 50.3  ?MCV 93.8 93.2 93.1  ?MCH 32.0 31.6 32.2  ?MCHC 34.1 33.9 34.6  ?RDW 12.4 12.6 12.6  ?PLT 224 236 198  ? ? ?Thyroid No results for input(s): TSH, FREET4 in the last 168 hours.  ?BNP ?Recent Labs  ?Lab 08/01/21 ?1110  ?BNP 26.3  ? ?  ?DDimer No results for input(s): DDIMER in the last 168 hours.  ? ?Radiology  ?  ?DG Chest 2 View ? ?Result Date: 08/01/2021 ?CLINICAL DATA:  Shortness of breath. Additional history provided: Shortness of breath and decreased emanate for 1 year. High blood pressure. EXAM: CHEST - 2 VIEW COMPARISON:  Prior chest radiographs 01/31/2020 and earlier. FINDINGS: Heart size within normal limits. Small ill-defined opacity within the left lung base appreciated on the PA radiograph only. No appreciable airspace consolidation within the right lung. No evidence of pleural effusion or pneumothorax. No acute bony abnormality identified. Degenerative changes of the spine. IMPRESSION: Small ill-defined opacity within the left lung base. This may reflect atelectasis, scarring or pneumonia. Clinical correlation is recommended. Additionally, short-interval follow-up PA and lateral chest radiographs (in 3-4 weeks time) are recommended to assess for resolution and to exclude underlying malignancy. If this finding persists at time of radiographic follow-up, a chest CT is recommended for further evaluation. Electronically Signed   By: Kellie Simmering D.O.   On: 08/01/2021 10:53  ? ?CARDIAC CATHETERIZATION ? ?Result Date: 08/01/2021 ?  Prox RCA lesion is 100% stenosed.   1st Mrg lesion is 99% stenosed.   Prox Cx to Mid Cx lesion is 50%  stenosed.   Mid LAD lesion is 75% stenosed.   2nd Diag lesion is 75% stenosed.   The left ventricular systolic function is normal.   LV end diastolic pressure is normal.   The left ventricular ejection fraction is 55-65% by visual estimate.   There is no aortic valve stenosis. Severe three vessel disease.  Normal LV function.  Plan for cardiac surgery consult.  ? ?ECHOCARDIOGRAM COMPLETE ? ?Result Date: 08/02/2021 ?   ECHOCARDIOGRAM REPORT   Patient Name:   Antonio [name] Date of Exam: 08/02/2021 Medical Rec #:  [id number]   Height:       68.0 in Accession #:    [id number]  Weight:       206.0 lb Date of Birth:  1960/09/20    BSA:          2.070 m? Patient Age:    61 years    BP:           132/90 mmHg Patient Gender: M           HR:           72 bpm. Exam Location:  Inpatient Procedure: 2D Echo Indications:    CAD  History:        Patient has no prior history of Echocardiogram examinations.                 CAD.  Sonographer:    Jefferey Pica Referring Phys: [id number] [place]  1. Left ventricular ejection fraction, by estimation, is 60 to 65%. The left ventricle has normal function. The left ventricle has no regional wall motion abnormalities. There is mild concentric left ventricular hypertrophy. Left ventricular diastolic parameters are indeterminate.  2. Right ventricular systolic function is normal. The right ventricular size is normal. Tricuspid regurgitation signal is inadequate for assessing PA pressure.  3. The mitral valve is normal in structure. No evidence of mitral valve regurgitation. No evidence of mitral stenosis.  4. The aortic valve is tricuspid. There is mild calcification of the aortic valve. There is mild thickening of the aortic valve. Aortic valve regurgitation is not visualized. No aortic stenosis is present.  5. Aortic dilatation noted. There is mild dilatation of the ascending aorta, measuring 41 mm.  6. The inferior vena cava is normal in size with <50% respiratory  variability, suggesting right atrial pressure of 8 mmHg. Comparison(s): No prior Echocardiogram. 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 FINDINGS  Left Ventricle: Left ventricular ejection fraction, by estimation, is 60 to 65%. The  left ventricle has normal function. The left ventricle has no regional wall motion abnormalities. The left ventricular internal cavity size was normal in size. There is  mild concentric left ventricular hypertrophy. Left ventricular diastolic parameters are indeterminate. Right Ventricle: The right ventricular size is normal. No increase in right ventricular wall thickness. Right ventricular systolic function is normal. Tricuspid regurgitation signal is inadequate for assessing PA pressure. Left Atrium: Left atrial size was normal in size. Right Atrium: Right atrial size was normal in size. Pericardium: There is no evidence of pericardial effusion. Mitral Valve: The mitral valve is normal in structure. No evidence of mitral valve regurgitation. No evidence of mitral valve stenosis. Tricuspid Valve: The tricuspid valve is normal in structure. Tricuspid valve regurgitation is trivial. No evidence of tricuspid stenosis. Aortic Valve: The aortic valve is tricuspid. There is mild calcification of the aortic valve. There is mild thickening of the aortic valve. Aortic valve regurgitation is not visualized. No aortic stenosis is present. Aortic valve peak gradient measures 11.4 mmHg. Pulmonic Valve: The pulmonic valve was not well visualized. Pulmonic valve regurgitation is not visualized. No evidence of pulmonic stenosis. Aorta: Aortic dilatation noted. There is mild dilatation of the ascending aorta, measuring 41 mm. Venous: The inferior vena cava is normal in size with less than 50% respiratory variability, suggesting right atrial pressure of 8 mmHg. IAS/Shunts: The atrial septum is grossly normal.  LEFT VENTRICLE PLAX 2D LVIDd:          4.01 cm   Diastology LVIDs:         2.70 cm   LV e' medial:    5.43 cm/s LV PW:         1.46 cm   LV E/e' medial:  15.0 LV IVS:        1.21 cm   LV e' lateral:   9.41 cm/s LVOT diam:     2.00 cm   LV E/e' lateral: 8

## 2021-08-03 NOTE — Progress Notes (Signed)
ANTICOAGULATION CONSULT NOTE ? ?Pharmacy Consult for heparin ?Indication: chest pain/ACS ? ?Heparin Dosing Weight: 87.9 kg ? ?Labs: ?Recent Labs  ?  08/01/21 ?1028 08/01/21 ?1028 08/02/21 ?0804 08/02/21 ?1840 08/03/21 ?0252 08/03/21 ?1225  ?HGB 18.7*  --  18.5*  --  17.4*  --   ?HCT 54.9*  --  54.6*  --  50.3  --   ?PLT 224  --  236  --  198  --   ?HEPARINUNFRC  --    < > 0.24* 0.26* 0.71* 0.70  ?CREATININE 1.12  --  1.04  --   --   --   ? < > = values in this interval not displayed.  ? ? ? ?Assessment: ?51 yom presenting with CP/SOB, elevated troponin. Pharmacy consulted to dose heparin for ACS.  Patient is s/p cath found to have multivessel disease with plan for CABG on 3/28. ? ?Heparin therapeutic at 0.70, barely in range. Per RN, no issues with the heparin infusion. Dropping heparin down to 1650 units/hr to keep the patient therapeutic. CBC is stable. ? ? ?Goal of Therapy:  ?Heparin level 0.3-0.7 units/ml ?Monitor platelets by anticoagulation protocol: Yes ?  ?Plan:  ?Dec heparin to 1650 units/hr ?2330 heparin level ?Daily HL, CBC  ?Monitor for s/sx of bleeding ? ?Marzella Schlein [place], Student Pharmacist  ? ? ?

## 2021-08-04 DIAGNOSIS — I214 Non-ST elevation (NSTEMI) myocardial infarction: Secondary | ICD-10-CM

## 2021-08-04 DIAGNOSIS — R911 Solitary pulmonary nodule: Secondary | ICD-10-CM

## 2021-08-04 LAB — CBC
HCT: 48.3 % (ref 39.0–52.0)
Hemoglobin: 16.9 g/dL (ref 13.0–17.0)
MCH: 32.5 pg (ref 26.0–34.0)
MCHC: 35 g/dL (ref 30.0–36.0)
MCV: 92.9 fL (ref 80.0–100.0)
Platelets: 204 10*3/uL (ref 150–400)
RBC: 5.2 MIL/uL (ref 4.22–5.81)
RDW: 12.6 % (ref 11.5–15.5)
WBC: 9.1 10*3/uL (ref 4.0–10.5)
nRBC: 0 % (ref 0.0–0.2)

## 2021-08-04 LAB — BASIC METABOLIC PANEL
Anion gap: 6 (ref 5–15)
BUN: 12 mg/dL (ref 8–23)
CO2: 24 mmol/L (ref 22–32)
Calcium: 8.8 mg/dL — ABNORMAL LOW (ref 8.9–10.3)
Chloride: 108 mmol/L (ref 98–111)
Creatinine, Ser: 0.98 mg/dL (ref 0.61–1.24)
GFR, Estimated: >60 mL/min (ref >60)
Glucose, Bld: 101 mg/dL — ABNORMAL HIGH (ref 70–99)
Potassium: 4 mmol/L (ref 3.5–5.1)
Sodium: 138 mmol/L (ref 135–145)

## 2021-08-04 LAB — URINALYSIS, ROUTINE W REFLEX MICROSCOPIC
Bilirubin Urine: NEGATIVE
Glucose, UA: NEGATIVE mg/dL
Hgb urine dipstick: NEGATIVE
Ketones, ur: NEGATIVE mg/dL
Leukocytes,Ua: NEGATIVE
Nitrite: NEGATIVE
Protein, ur: NEGATIVE mg/dL
Specific Gravity, Urine: 1.003 — ABNORMAL LOW (ref 1.005–1.030)
pH: 7 (ref 5.0–8.0)

## 2021-08-04 LAB — SURGICAL PCR SCREEN
MRSA, PCR: NEGATIVE
Staphylococcus aureus: NEGATIVE

## 2021-08-04 LAB — HEPARIN LEVEL (UNFRACTIONATED): Heparin Unfractionated: 0.69 IU/mL (ref 0.30–0.70)

## 2021-08-04 MED ORDER — LOSARTAN POTASSIUM 25 MG PO TABS
25.0000 mg | ORAL_TABLET | Freq: Every day | ORAL | Status: DC
Start: 2021-08-04 — End: 2021-08-04

## 2021-08-04 MED ORDER — THE SENSUOUS HEART BOOK
Freq: Once | Status: AC
  Filled 2021-08-04: qty 1

## 2021-08-04 NOTE — Progress Notes (Signed)
ANTICOAGULATION CONSULT NOTE ?Pharmacy Consult for heparin ?Indication: chest pain/ACS ?Brief A/P: Heparin level within goal range Continue Heparin at current rate  ? ?Heparin Dosing Weight: 87.9 kg ? ?Labs: ?Recent Labs  ?  08/01/21 ?1028 08/02/21 ?0804 08/02/21 ?1840 08/03/21 ?0252 08/03/21 ?1225 08/04/21 ?0011  ?HGB 18.7* 18.5*  --  17.4*  --  16.9  ?HCT 54.9* 54.6*  --  50.3  --  48.3  ?PLT 224 236  --  198  --  204  ?HEPARINUNFRC  --  0.24*   < > 0.71* 0.70 0.69  ?CREATININE 1.12 1.04  --   --   --  0.98  ? < > = values in this interval not displayed.  ? ? ? ?Assessment: ?61 y.o. male with NSTEMI s/p cath, awaiting CABG, for heparin  ? ? ?Goal of Therapy:  ?Heparin level 0.3-0.7 units/ml ?Monitor platelets by anticoagulation protocol: Yes ?  ?Plan:  ?Continue Heparin at current rate  ? ?Phillis Knack, PharmD, BCPS ? ? ? ?

## 2021-08-04 NOTE — Progress Notes (Signed)
? ?Progress Note ? ?Patient Name: Antonio [name] ?Date of Encounter: 08/04/2021 ? ?[facility] Cardiologist: Candee Furbish, MD  ? ?Subjective  ? ?Denies chest pain or dyspnea.  BP 131/76.  Hemoglobin 16.9, creatinine 0.98 ? ?Inpatient Medications  ?  ?Scheduled Meds: ? vitamin C  1,000 mg Oral Daily  ? aspirin EC  81 mg Oral Daily  ? atorvastatin  80 mg Oral q1800  ? cholecalciferol  1,000 Units Oral Daily  ? metoprolol tartrate  25 mg Oral BID  ? sodium chloride flush  3 mL Intravenous Q12H  ? sodium chloride flush  3 mL Intravenous Q12H  ? the sensuous heart book   Does not apply Once  ? ?Continuous Infusions: ? sodium chloride    ? heparin 1,650 Units/hr (08/04/21 0004)  ? ?PRN Meds: ?sodium chloride, acetaminophen, nitroGLYCERIN, ondansetron (ZOFRAN) IV, sodium chloride flush  ? ?Vital Signs  ?  ?Vitals:  ? 08/03/21 1439 08/03/21 2121 08/03/21 2126 08/04/21 0645  ?BP: 135/85 (!) 139/113 (!) 144/88 131/76  ?Pulse: 63 73  68  ?Resp: '20 14  18  '$ ?Temp: 98.7 ?F (37.1 ?C) 98 ?F (36.7 ?C)  99 ?F (37.2 ?C)  ?TempSrc: Oral Oral  Oral  ?SpO2: 98% 99%  98%  ?Weight:      ?Height:      ? ? ?Intake/Output Summary (Last 24 hours) at 08/04/2021 0819 ?Last data filed at 08/03/2021 1507 ?Gross per 24 hour  ?Intake 142.82 ml  ?Output --  ?Net 142.82 ml  ? ? ? ?  08/01/2021  ? 10:17 AM 01/31/2020  ?  5:14 PM  ?Last 3 Weights  ?Weight (lbs) 206 lb 210 lb  ?Weight (kg) 93.441 kg 95.255 kg  ?   ? ?Telemetry  ?  ?No adverse arrhythmias- Personally Reviewed ? ?ECG  ?  ?Normal sinus rhythm, rate 61, no ST abnormalities- Personally Reviewed ? ?Physical Exam  ? ?GEN: No acute distress.   ?Neck: No JVD ?Cardiac: RRR, no murmurs, rubs, or gallops.  Cath site intact ?Respiratory: Clear to auscultation bilaterally. ?GI: Soft, nontender, non-distended  ?MS: No edema; No deformity. ?Neuro:  Nonfocal  ?Psych: Normal affect  ? ?Labs  ?  ?High Sensitivity Troponin:   ?Recent Labs  ?Lab 08/01/21 ?1028 08/01/21 ?1220  ?TROPONINIHS 162* 169*  ? ?    ?Chemistry ?Recent Labs  ?Lab 08/01/21 ?1028 08/02/21 ?0804 08/04/21 ?0011  ?NA 138 137 138  ?K 4.8 4.6 4.0  ?CL 104 105 108  ?CO2 '28 25 24  '$ ?GLUCOSE 99 112* 101*  ?BUN '10 11 12  '$ ?CREATININE 1.12 1.04 0.98  ?CALCIUM 9.6 9.3 8.8*  ?GFRNONAA >60 >60 >60  ?ANIONGAP '6 7 6  '$ ? ?  ?Lipids  ?Recent Labs  ?Lab 08/02/21 ?0804  ?CHOL 192  ?TRIG 102  ?HDL 42  ?LDLCALC 130*  ?CHOLHDL 4.6  ? ?  ?Hematology ?Recent Labs  ?Lab 08/02/21 ?0804 08/03/21 ?0252 08/04/21 ?0011  ?WBC 8.9 8.3 9.1  ?RBC 5.86* 5.40 5.20  ?HGB 18.5* 17.4* 16.9  ?HCT 54.6* 50.3 48.3  ?MCV 93.2 93.1 92.9  ?MCH 31.6 32.2 32.5  ?MCHC 33.9 34.6 35.0  ?RDW 12.6 12.6 12.6  ?PLT 236 198 204  ? ? ?Thyroid No results for input(s): TSH, FREET4 in the last 168 hours.  ?BNP ?Recent Labs  ?Lab 08/01/21 ?1110  ?BNP 26.3  ? ?  ?DDimer No results for input(s): DDIMER in the last 168 hours.  ? ?Radiology  ?  ?ECHOCARDIOGRAM COMPLETE ? ?Result Date: 08/02/2021 ?  ECHOCARDIOGRAM REPORT   Patient Name:   TREYTEN MONESTIME Date of Exam: 08/02/2021 Medical Rec #:  [id number]   Height:       68.0 in Accession #:    [id number]  Weight:       206.0 lb Date of Birth:  May 09, 1961    BSA:          2.070 m? Patient Age:    20 years    BP:           132/90 mmHg Patient Gender: M           HR:           72 bpm. Exam Location:  Inpatient Procedure: 2D Echo Indications:    CAD  History:        Patient has no prior history of Echocardiogram examinations.                 CAD.  Sonographer:    Jefferey Pica Referring Phys: [id number] [place]  1. Left ventricular ejection fraction, by estimation, is 60 to 65%. The left ventricle has normal function. The left ventricle has no regional wall motion abnormalities. There is mild concentric left ventricular hypertrophy. Left ventricular diastolic parameters are indeterminate.  2. Right ventricular systolic function is normal. The right ventricular size is normal. Tricuspid regurgitation signal is inadequate for assessing PA pressure.  3.  The mitral valve is normal in structure. No evidence of mitral valve regurgitation. No evidence of mitral stenosis.  4. The aortic valve is tricuspid. There is mild calcification of the aortic valve. There is mild thickening of the aortic valve. Aortic valve regurgitation is not visualized. No aortic stenosis is present.  5. Aortic dilatation noted. There is mild dilatation of the ascending aorta, measuring 41 mm.  6. The inferior vena cava is normal in size with <50% respiratory variability, suggesting right atrial pressure of 8 mmHg. Comparison(s): No prior Echocardiogram. 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 FINDINGS  Left Ventricle: Left ventricular ejection fraction, by estimation, is 60 to 65%. The left ventricle has normal function. The left ventricle has no regional wall motion abnormalities. The left ventricular internal cavity size was normal in size. There is  mild concentric left ventricular hypertrophy. Left ventricular diastolic parameters are indeterminate. Right Ventricle: The right ventricular size is normal. No increase in right ventricular wall thickness. Right ventricular systolic function is normal. Tricuspid regurgitation signal is inadequate for assessing PA pressure. Left Atrium: Left atrial size was normal in size. Right Atrium: Right atrial size was normal in size. Pericardium: There is no evidence of pericardial effusion. Mitral Valve: The mitral valve is normal in structure. No evidence of mitral valve regurgitation. No evidence of mitral valve stenosis. Tricuspid Valve: The tricuspid valve is normal in structure. Tricuspid valve regurgitation is trivial. No evidence of tricuspid stenosis. Aortic Valve: The aortic valve is tricuspid. There is mild calcification of the aortic valve. There is mild thickening of the aortic valve. Aortic valve regurgitation is not visualized. No aortic stenosis is present. Aortic valve peak gradient  measures 11.4 mmHg. Pulmonic Valve: The pulmonic valve was not well visualized. Pulmonic valve regurgitation is not visualized. No evidence of pulmonic stenosis. Aorta: Aortic dilatation noted. There is mild dilatation of the ascending aorta, measuring 41 mm. Venous: The inferior vena cava is normal in size with less than 50% respiratory variability, suggesting right atrial pressure of 8 mmHg. IAS/Shunts: The atrial septum is grossly  normal.  LEFT VENTRICLE PLAX 2D LVIDd:         4.01 cm   Diastology LVIDs:         2.70 cm   LV e' medial:    5.43 cm/s LV PW:         1.46 cm   LV E/e' medial:  15.0 LV IVS:        1.21 cm   LV e' lateral:   9.41 cm/s LVOT diam:     2.00 cm   LV E/e' lateral: 8.6 LV SV:         64 LV SV Index:   31 LVOT Area:     3.14 cm?  RIGHT VENTRICLE             IVC RV Basal diam:  3.40 cm     IVC diam: 1.80 cm RV Mid diam:    4.30 cm RV S prime:     14.40 cm/s TAPSE (M-mode): 2.2 cm LEFT ATRIUM             Index        RIGHT ATRIUM           Index LA diam:        3.80 cm 1.84 cm/m?   RA Area:     12.80 cm? LA Vol (A2C):   48.4 ml 23.39 ml/m?  RA Volume:   27.10 ml  13.09 ml/m? LA Vol (A4C):   44.0 ml 21.26 ml/m? LA Biplane Vol: 48.2 ml 23.29 ml/m?  AORTIC VALVE                 PULMONIC VALVE AV Area (Vmax): 2.29 cm?     PV Vmax:       0.94 m/s AV Vmax:        168.50 cm/s  PV Peak grad:  3.5 mmHg AV Peak Grad:   11.4 mmHg LVOT Vmax:      123.00 cm/s LVOT Vmean:     66.200 cm/s LVOT VTI:       0.205 m  AORTA Ao Root diam: 3.40 cm Ao Asc diam:  4.10 cm MITRAL VALVE MV Area (PHT): 5.20 cm?    SHUNTS MV Decel Time: 146 msec    Systemic VTI:  0.20 m MV E velocity: 81.20 cm/s  Systemic Diam: 2.00 cm MV A velocity: 83.20 cm/s MV E/A ratio:  0.98 Buford Dresser MD Electronically signed by Buford Dresser MD Signature Date/Time: 08/02/2021/2:03:42 PM    Final   ? ?VAS US DOPPLER PRE CABG ? ?Result Date: 08/02/2021 ?PREOPERATIVE VASCULAR EVALUATION Patient Name:  BRODEY BONN  Date of Exam:    08/02/2021 Medical Rec #: [id number]    Accession #:    [id number] Date of Birth: 1960-12-11     Patient Gender: M Patient Age:   89 years Exam Location:  [facility] Procedure:      VAS US DOPPLER PRE CABG

## 2021-08-05 LAB — BLOOD GAS, ARTERIAL
Acid-Base Excess: 0.8 mmol/L (ref 0.0–2.0)
Bicarbonate: 24.2 mmol/L (ref 20.0–28.0)
O2 Saturation: 97.9 %
Patient temperature: 37
pCO2 arterial: 34 mmHg (ref 32–48)
pH, Arterial: 7.46 — ABNORMAL HIGH (ref 7.35–7.45)
pO2, Arterial: 93 mmHg (ref 83–108)

## 2021-08-05 LAB — ABO/RH: ABO/RH(D): O POS

## 2021-08-05 LAB — HEPARIN LEVEL (UNFRACTIONATED): Heparin Unfractionated: 0.56 IU/mL (ref 0.30–0.70)

## 2021-08-05 LAB — CBC
HCT: 51.2 % (ref 39.0–52.0)
Hemoglobin: 17.6 g/dL — ABNORMAL HIGH (ref 13.0–17.0)
MCH: 32.1 pg (ref 26.0–34.0)
MCHC: 34.4 g/dL (ref 30.0–36.0)
MCV: 93.4 fL (ref 80.0–100.0)
Platelets: 195 10*3/uL (ref 150–400)
RBC: 5.48 MIL/uL (ref 4.22–5.81)
RDW: 12.7 % (ref 11.5–15.5)
WBC: 8.7 10*3/uL (ref 4.0–10.5)
nRBC: 0 % (ref 0.0–0.2)

## 2021-08-05 LAB — PREPARE RBC (CROSSMATCH)

## 2021-08-05 MED ORDER — INSULIN REGULAR(HUMAN) IN NACL 100-0.9 UT/100ML-% IV SOLN
INTRAVENOUS | Status: AC
  Administered 2021-08-06: 1.7 [IU]/h via INTRAVENOUS
  Filled 2021-08-05: qty 100

## 2021-08-05 MED ORDER — TRANEXAMIC ACID (OHS) PUMP PRIME SOLUTION
2.0000 mg/kg | INTRAVENOUS | Status: DC
  Filled 2021-08-05: qty 1.87

## 2021-08-05 MED ORDER — PHENYLEPHRINE HCL-NACL 20-0.9 MG/250ML-% IV SOLN
30.0000 ug/min | INTRAVENOUS | Status: DC
  Filled 2021-08-05: qty 250

## 2021-08-05 MED ORDER — DEXMEDETOMIDINE HCL IN NACL 400 MCG/100ML IV SOLN
0.1000 ug/kg/h | INTRAVENOUS | Status: AC
  Administered 2021-08-06: .3 ug/kg/h via INTRAVENOUS
  Filled 2021-08-05: qty 100

## 2021-08-05 MED ORDER — METOPROLOL TARTRATE 12.5 MG HALF TABLET: 12.5000 mg | ORAL_TABLET | Freq: Once | ORAL | Status: DC

## 2021-08-05 MED ORDER — EPINEPHRINE HCL 5 MG/250ML IV SOLN IN NS
0.0000 ug/min | INTRAVENOUS | Status: DC
  Filled 2021-08-05: qty 250

## 2021-08-05 MED ORDER — HEPARIN 30,000 UNITS/1000 ML (OHS) CELLSAVER SOLUTION
Status: DC
  Filled 2021-08-05: qty 1000

## 2021-08-05 MED ORDER — POTASSIUM CHLORIDE 2 MEQ/ML IV SOLN
80.0000 meq | INTRAVENOUS | Status: DC
  Filled 2021-08-05: qty 40

## 2021-08-05 MED ORDER — PAPAVERINE HCL 30 MG/ML IJ SOLN
INTRAMUSCULAR | Status: DC
  Filled 2021-08-05: qty 2.5

## 2021-08-05 MED ORDER — CEFAZOLIN SODIUM-DEXTROSE 2-4 GM/100ML-% IV SOLN
2.0000 g | INTRAVENOUS | Status: DC
  Filled 2021-08-05: qty 100

## 2021-08-05 MED ORDER — NOREPINEPHRINE 4 MG/250ML-% IV SOLN
0.0000 ug/min | INTRAVENOUS | Status: DC
  Filled 2021-08-05: qty 250

## 2021-08-05 MED ORDER — MILRINONE LACTATE IN DEXTROSE 20-5 MG/100ML-% IV SOLN
0.3000 ug/kg/min | INTRAVENOUS | Status: DC
  Filled 2021-08-05: qty 100

## 2021-08-05 MED ORDER — CEFAZOLIN SODIUM-DEXTROSE 2-4 GM/100ML-% IV SOLN
2.0000 g | INTRAVENOUS | Status: AC
  Administered 2021-08-06 (×2): 2 g via INTRAVENOUS
  Filled 2021-08-05: qty 100

## 2021-08-05 MED ORDER — BISACODYL 5 MG PO TBEC
5.0000 mg | DELAYED_RELEASE_TABLET | Freq: Once | ORAL | Status: AC
  Administered 2021-08-05: 5 mg via ORAL
  Filled 2021-08-05: qty 1

## 2021-08-05 MED ORDER — NITROGLYCERIN IN D5W 200-5 MCG/ML-% IV SOLN
2.0000 ug/min | INTRAVENOUS | Status: AC
  Administered 2021-08-06: 16.6 ug/min via INTRAVENOUS
  Filled 2021-08-05: qty 250

## 2021-08-05 MED ORDER — SODIUM CHLORIDE 0.9 % IV SOLN
1.5000 mg/kg/h | INTRAVENOUS | Status: AC
  Administered 2021-08-06: 1.5 mg/kg/h via INTRAVENOUS
  Filled 2021-08-05: qty 25

## 2021-08-05 MED ORDER — VANCOMYCIN HCL 1500 MG/300ML IV SOLN
1500.0000 mg | INTRAVENOUS | Status: AC
  Administered 2021-08-06: 1500 mg via INTRAVENOUS
  Filled 2021-08-05: qty 300

## 2021-08-05 MED ORDER — TRANEXAMIC ACID (OHS) BOLUS VIA INFUSION
15.0000 mg/kg | INTRAVENOUS | Status: AC
  Administered 2021-08-06: 1026 mg via INTRAVENOUS
  Filled 2021-08-05: qty 1026

## 2021-08-05 MED ORDER — TEMAZEPAM 15 MG PO CAPS
15.0000 mg | ORAL_CAPSULE | Freq: Once | ORAL | Status: DC | PRN
  Filled 2021-08-05 (×2): qty 1

## 2021-08-05 MED ORDER — MANNITOL 20 % IV SOLN
INTRAVENOUS | Status: DC
  Filled 2021-08-05: qty 13

## 2021-08-05 NOTE — Progress Notes (Signed)
ANTICOAGULATION CONSULT NOTE ? ?Pharmacy Consult for heparin ?Indication: chest pain/ACS ? ?Heparin Dosing Weight: 87.9 kg ? ?Labs: ?Recent Labs  ?  08/03/21 ?0252 08/03/21 ?1225 08/04/21 ?0011 08/05/21 ?0223  ?HGB 17.4*  --  16.9 17.6*  ?HCT 50.3  --  48.3 51.2  ?PLT 198  --  204 195  ?HEPARINUNFRC 0.71* 0.70 0.69 0.56  ?CREATININE  --   --  0.98  --   ? ? ? ?Assessment: ?59 yom presenting with CP/SOB, elevated troponin. Pharmacy consulted to dose heparin for ACS.  Patient is s/p cath found to have multivessel disease with plan for CABG on 3/28. ? ?Heparin therapeutic this AM at 0.56. CBC stable, no overt bleeding or complications noted.  Awaiting CABG tomorrow. ? ?Goal of Therapy:  ?Heparin level 0.3-0.7 units/ml ?Monitor platelets by anticoagulation protocol: Yes ?  ?Plan:  ?Continue IV heparin at 1650 units/hr. ?Daily heparin level and CBC. ?F/u after CABG tomorrow. ? ?[place] Crane, Pharm D, BCPS, BCCP ?Clinical Pharmacist ? 08/05/2021 11:25 AM  ? ?[facility] pharmacy phone numbers are listed on [email or web address] ? ? ? ?

## 2021-08-05 NOTE — Progress Notes (Signed)
?  08/05/21 1300  ?Clinical Encounter Type  ?Visited With Patient and family together;Health care provider  ?Visit Type Follow-up ?Music therapist)  ?Referral From Nurse ?(Brroke B. Raphael Gibney, Therapist, sports)  ?Consult/Referral To Chaplain  ?Advance Directives (For Healthcare)  ?Does Patient Have a Medical Advance Directive? Yes  ?Type of Paramedic of [place];Living will  ?Copy of [place] in Chart? Yes - validated most recent copy scanned in chart (See row information)  ?Copy of Living Will in Chart? Yes - validated most recent copy scanned in chart (See row information)  ? ?Requested to coordinate a Notary and two volunteers from [employer] to witness Mr. Antonio [name]. Antonio [name]  signing of his Advance Directive: [place] and Living Will. Met with Mr. Antonio [name] at his bedside. Patient indicated that he wishes for his wife, Antonio [name] [phone] which is clearly indicated on H.C. P.[facility]A. and that his wishes were clearly indicated by his initials on his Living Will.   ? ?Patient signed his Advance Directive in the presence of [place]; and volunteer witnesses [employer] and Jeannetta Nap all of whom have signed in the appropriate spaces on Part: C that they did in deed witness Antonio [name] sign his H.C.P.O.A. and Living Will.   ? ?A copy of Antonio [name]?s A.D. was placed in his hard chart, a copy was emailed for scanning into his electronic medical record, the original and one copy were then returned to the patient. Total time 1.5 hours.  ? ? [place], M. Min. [phone].  ?

## 2021-08-05 NOTE — Progress Notes (Signed)
CARDIAC REHAB PHASE I  ? ?Checked in on pt. Pt denies questions or concerns regarding preop education. Will continue to follow throughout his hospital stay. ? ?[phone] ?Rufina Falco, RN BSN ?08/05/2021 ?10:57 AM ? ?

## 2021-08-05 NOTE — TOC Initial Note (Signed)
Transition of Care (TOC) - Initial/Assessment Note  ? ? ?Patient Details  ?Name: Antonio [name] ?MRN: [id number] ?Date of Birth: 08-06-1960 ? ?Transition of Care (TOC) CM/SW Contact:    ?Graves-Bigelow, Ocie Cornfield, RN ?Phone Number: ?08/05/2021, 4:15 PM ? ?Clinical Narrative:  Case Manager spoke with patient and the spouse was at the bedside. Patient is without insurance and he gets assistance from [employer] Spouse states that the patient works for [employer], he has a PCP, and uses [facility] in [place]. Patient is planned for CABG on 08-06-21. Case Manager will continue to follow for disposition needs as the patient progresses.              ? ? ?Expected Discharge Plan: Home/Self Care ?Barriers to Discharge: Continued Medical Work up ? ? ?Patient Goals and CMS Choice ?Patient states their goals for this hospitalization and ongoing recovery are:: to return home once stable. ?  ?  ? ?Expected Discharge Plan and Services ?Expected Discharge Plan: Home/Self Care ?In-house Referral: Financial Counselor ?Discharge Planning Services: CM Consult ?  ?Living arrangements for the past 2 months: [place] ?                ?  ?DME Agency: NA ?  ?  ? ?Prior Living Arrangements/Services ?Living arrangements for the past 2 months: [place] ?Lives with:: Self, Spouse ?Patient language and need for interpreter reviewed:: Yes ?Do you feel safe going back to the place where you live?: Yes      ?Need for Family Participation in Patient Care: Yes (Comment) ?Care giver support system in place?: Yes (comment) ?  ?Criminal Activity/Legal Involvement Pertinent to Current Situation/Hospitalization: No - Comment as needed ? ?Activities of Daily Living ?Home Assistive Devices/Equipment: None ?ADL Screening (condition at time of admission) ?Patient's cognitive ability adequate to safely complete daily activities?: Yes ?Is the patient deaf or have difficulty hearing?: No ?Does the patient have  difficulty seeing, even when wearing glasses/contacts?: No ?Does the patient have difficulty concentrating, remembering, or making decisions?: No ?Patient able to express need for assistance with ADLs?: No ?Does the patient have difficulty dressing or bathing?: No ?Independently performs ADLs?: Yes (appropriate for developmental age) ?Does the patient have difficulty walking or climbing stairs?: No ?Weakness of Legs: None ?Weakness of Arms/Hands: None ? ?Permission Sought/Granted ?Permission sought to share information with : Family Supports, Customer service manager, Case Manager ?  ?   ?   ?   ?   ? ?Emotional Assessment ?Appearance:: Appears stated age ?Attitude/Demeanor/Rapport: Engaged ?Affect (typically observed): Appropriate ?Orientation: : Oriented to Situation, Oriented to  Time, Oriented to Self ?Alcohol / Substance Use: Not Applicable ?Psych Involvement: No (comment) ? ?Admission diagnosis:  Precordial chest pain [R07.2] ?Elevated blood pressure reading [R03.0] ?Acute coronary syndrome ([place]) [I24.9] ?ACS (acute coronary syndrome) ([place]) [I24.9] ?Nodule of lower lobe of left lung [R91.1] ?Patient Active Problem List  ? Diagnosis Date Noted  ? Non-ST elevation (NSTEMI) myocardial infarction [facility])   ? Nodule of lower lobe of left lung   ? Hyperlipidemia   ? Acute coronary syndrome ([place]) 08/01/2021  ? Basal cell carcinoma 03/23/2018  ? ?PCP:  Bryson Corona, NP ?Pharmacy:   ?[facility], [address] ?[address] ?[place] [address] ?Phone: [phone] Fax: [phone] ? ?Readmission Risk Interventions ?   ? View : No data to display.  ?  ?  ?  ? ? ? ?

## 2021-08-05 NOTE — Progress Notes (Signed)
Mobility Specialist Progress Note ? ? 08/05/21 1812  ?Mobility  ?Activity Ambulated independently in hallway  ?Level of Assistance Independent after set-up  ?Assistive Device  ?(IV Pole)  ?Distance Ambulated (ft) 470 ft  ?Activity Response Tolerated well  ?$Mobility charge 1 Mobility  ? ?Received pt in bed having no complaints and agreeable to mobility. Asymptomatic throughout ambulation, returned back to bed w/ call bell in reach and all needs met. ? ?Holland Falling ?Mobility Specialist ?Phone Number [phone] ? ?

## 2021-08-05 NOTE — Progress Notes (Addendum)
? ?Progress Note ? ?Patient Name: Antonio [name] ?Date of Encounter: 08/05/2021 ? ?[facility] Cardiologist: Candee Furbish, MD  ? ?Subjective  ? ?This AM with tying up outpt things he was frustrated and developed mild chest tightness - he stopped and relaxed and tightness resolved.  ? ?Inpatient Medications  ?  ?Scheduled Meds: ? vitamin C  1,000 mg Oral Daily  ? aspirin EC  81 mg Oral Daily  ? atorvastatin  80 mg Oral q1800  ? cholecalciferol  1,000 Units Oral Daily  ? metoprolol tartrate  25 mg Oral BID  ? sodium chloride flush  3 mL Intravenous Q12H  ? sodium chloride flush  3 mL Intravenous Q12H  ? ?Continuous Infusions: ? sodium chloride    ? heparin 1,650 Units/hr (08/04/21 1605)  ? ?PRN Meds: ?sodium chloride, acetaminophen, nitroGLYCERIN, ondansetron (ZOFRAN) IV, sodium chloride flush  ? ?Vital Signs  ?  ?Vitals:  ? 08/04/21 2041 08/04/21 2155 08/05/21 0019 08/05/21 0521  ?BP: 125/86 127/81 120/64 118/82  ?Pulse:  (!) 36 60 (!) 59  ?Resp: '16  16 15  '$ ?Temp: 98 ?F (36.7 ?C)  97.8 ?F (36.6 ?C) 98.2 ?F (36.8 ?C)  ?TempSrc: Oral  Oral Oral  ?SpO2:   98% 95%  ?Weight:      ?Height:      ? ? ?Intake/Output Summary (Last 24 hours) at 08/05/2021 0746 ?Last data filed at 08/04/2021 1600 ?Gross per 24 hour  ?Intake 409.24 ml  ?Output --  ?Net 409.24 ml  ? ? ?  08/01/2021  ? 10:17 AM 01/31/2020  ?  5:14 PM  ?Last 3 Weights  ?Weight (lbs) 206 lb 210 lb  ?Weight (kg) 93.441 kg 95.255 kg  ?   ? ?Telemetry  ?  ?Sr to SB at 0600 at 30 - Personally Reviewed ? ?ECG  ?  ?No new - Personally Reviewed ? ?Physical Exam  ? ?GEN: No acute distress.   ?Neck: No JVD ?Cardiac: RRR, no murmurs, rubs, or gallops.  ?Respiratory: Clear to auscultation bilaterally. ?GI: Soft, nontender, non-distended  ?MS: No edema; No deformity. ?Neuro:  Nonfocal  ?Psych: Normal affect  ? ?Labs  ?  ?High Sensitivity Troponin:   ?Recent Labs  ?Lab 08/01/21 ?1028 08/01/21 ?1220  ?TROPONINIHS 162* 169*  ?   ?Chemistry ?Recent Labs  ?Lab 08/01/21 ?1028  08/02/21 ?0804 08/04/21 ?0011  ?NA 138 137 138  ?K 4.8 4.6 4.0  ?CL 104 105 108  ?CO2 '28 25 24  '$ ?GLUCOSE 99 112* 101*  ?BUN '10 11 12  '$ ?CREATININE 1.12 1.04 0.98  ?CALCIUM 9.6 9.3 8.8*  ?GFRNONAA >60 >60 >60  ?ANIONGAP '6 7 6  '$ ?  ?Lipids  ?Recent Labs  ?Lab 08/02/21 ?0804  ?CHOL 192  ?TRIG 102  ?HDL 42  ?LDLCALC 130*  ?CHOLHDL 4.6  ?  ?Hematology ?Recent Labs  ?Lab 08/03/21 ?0252 08/04/21 ?0011 08/05/21 ?0223  ?WBC 8.3 9.1 8.7  ?RBC 5.40 5.20 5.48  ?HGB 17.4* 16.9 17.6*  ?HCT 50.3 48.3 51.2  ?MCV 93.1 92.9 93.4  ?MCH 32.2 32.5 32.1  ?MCHC 34.6 35.0 34.4  ?RDW 12.6 12.6 12.7  ?PLT 198 204 195  ? ?Thyroid No results for input(s): TSH, FREET4 in the last 168 hours.  ?BNP ?Recent Labs  ?Lab 08/01/21 ?1110  ?BNP 26.3  ?  ?DDimer No results for input(s): DDIMER in the last 168 hours.  ? ?Radiology  ?  ?No results found. ? ?Cardiac Studies  ? ?Left Cardiac Catheterization 08/01/2021: ?  Prox RCA  lesion is 100% stenosed. ?  1st Mrg lesion is 99% stenosed. ?  Prox Cx to Mid Cx lesion is 50% stenosed. ?  Mid LAD lesion is 75% stenosed. ?  2nd Diag lesion is 75% stenosed. ?  The left ventricular systolic function is normal. ?  LV end diastolic pressure is normal. ?  The left ventricular ejection fraction is 55-65% by visual estimate. ?  There is no aortic valve stenosis. ?  ?Severe three vessel disease.  Normal LV function.  Plan for cardiac surgery consult.  ?  ?Diagnostic ?Dominance: Right ?Preliminary echo with normal ejection fraction no significant valvular abnormalities ? ?TTE 08/02/21 ? ?IMPRESSIONS  ? ? ? 1. Left ventricular ejection fraction, by estimation, is 60 to 65%. The  ?left ventricle has normal function. The left ventricle has no regional  ?wall motion abnormalities. There is mild concentric left ventricular  ?hypertrophy. Left ventricular diastolic  ?parameters are indeterminate.  ? 2. Right ventricular systolic function is normal. The right ventricular  ?size is normal. Tricuspid regurgitation signal is  inadequate for assessing  ?PA pressure.  ? 3. The mitral valve is normal in structure. No evidence of mitral valve  ?regurgitation. No evidence of mitral stenosis.  ? 4. The aortic valve is tricuspid. There is mild calcification of the  ?aortic valve. There is mild thickening of the aortic valve. Aortic valve  ?regurgitation is not visualized. No aortic stenosis is present.  ? 5. Aortic dilatation noted. 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  ? 6. The inferior vena cava is normal in size with <50% respiratory  ?variability, suggesting right atrial pressure of 8 mmHg.  ? ?Comparison(s): No prior Echocardiogram.  ? 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  ? ?FINDINGS  ? Left Ventricle: Left ventricular ejection fraction, by estimation, is 60  ?to 65%. The left ventricle has normal function. The left ventricle has no  ?regional wall motion abnormalities. The left ventricular internal cavity  ?size was normal in size. There is  ? mild concentric left ventricular hypertrophy. Left ventricular diastolic  ?parameters are indeterminate.  ? ?Right Ventricle: The right ventricular size is normal. No increase in  ?right ventricular wall thickness. Right ventricular systolic function is  ?normal. Tricuspid regurgitation signal is inadequate for assessing PA  ?pressure.  ? ?Left Atrium: Left atrial size was normal in size.  ? ?Right Atrium: Right atrial size was normal in size.  ? ?Pericardium: There is no evidence of pericardial effusion.  ? ?Mitral Valve: The mitral valve is normal in structure. No evidence of  ?mitral valve regurgitation. No evidence of mitral valve stenosis.  ? ?Tricuspid Valve: The tricuspid valve is normal in structure. Tricuspid  ?valve regurgitation is trivial. No evidence of tricuspid stenosis.  ? ?Aortic Valve: The aortic valve is tricuspid. There is mild calcification  ?of the aortic valve. There is mild  thickening of the aortic valve. Aortic  ?valve regurgitation is not visualized. No aortic stenosis is present.  ?Aortic valve peak gradient measures  ?11.4 mmHg.  ? ?Pulmonic Valve: The pulmonic valve was not well visualized. Pulmonic valve  ?regurgitation is not visualized. No evidence of pulmonic stenosis.  ? ?Aorta: Aortic dilatation noted. 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  ? ?Venous: The inferior vena cava is normal in size with less than 50%  ?respiratory variability, suggesting right atrial pressure of 8 mmHg.  ? ?IAS/Shunts: The atrial  septum is grossly normal.  ? ?Patient Profile  ?   ?61 y.o. male with actinic keratosis but known cardiac history prior to admission who was admitted on 08/01/2021 for NSTEMI after presenting with chest pain and shortness of breath. LHC showed 3 vessel CAD and CT surgery was consulted.  CABG X 4 on 08/02/21 ?  ? ?Assessment & Plan  ?  ?NSTEMI ?Patient presented with chest pain and shortness of breath. High-sensitivity troponin 162 >> 169. LHC showed 3 vessel CAD so CT surgery has been consulted. Echo shows EF 60 to 65%, normal RV function, no significant valvular disease ?- Continue IV Heparin. ?- Continue Aspirin '81mg'$  daily, Lopressor '25mg'$  twice daily, and Lipitor '80mg'$  daily. ?- CT surgery consulted, planning CABG 3/28 ?  ?Abnormal Chest X-Ray ?Chest x-ray in the ED showed an ill-defined opacity within the left lung base.  ?- Repeat chest x-ray recommend in 3-4 weeks. ?- Finding and recommendations have been relayed to family. ?  ?Hyperlipidemia: LDL 130, started Lipitor 80 mg daily ?  ? ?For questions or updates, please contact [place] ?Please consult [email or web address] for contact info under  ? ?  ?   ?Signed, ?Cecilie Kicks, NP  ?08/05/2021, 7:46 AM   ? ?Patient seen and examined.  Agree with above documentation.  On exam, patient is alert and oriented, regular rate and rhythm, no murmurs, lungs CTAB, no LE edema or JVD.  Plan for CABG  tomorrow. ? ?Donato Heinz, MD ? ? ? ?

## 2021-08-06 ENCOUNTER — Inpatient Hospital Stay (HOSPITAL_COMMUNITY): Payer: Self-pay

## 2021-08-06 ENCOUNTER — Other Ambulatory Visit: Payer: Self-pay

## 2021-08-06 ENCOUNTER — Inpatient Hospital Stay (HOSPITAL_COMMUNITY): Payer: Self-pay | Admitting: Anesthesiology

## 2021-08-06 ENCOUNTER — Inpatient Hospital Stay (HOSPITAL_COMMUNITY)
Admission: EM | Disposition: A | Discharge status: Home / Self Care | Payer: Self-pay | Source: Home / Self Care | Attending: Cardiology

## 2021-08-06 DIAGNOSIS — I10 Essential (primary) hypertension: Secondary | ICD-10-CM

## 2021-08-06 DIAGNOSIS — Z87891 Personal history of nicotine dependence: Secondary | ICD-10-CM

## 2021-08-06 DIAGNOSIS — Z951 Presence of aortocoronary bypass graft: Secondary | ICD-10-CM

## 2021-08-06 DIAGNOSIS — I2510 Atherosclerotic heart disease of native coronary artery without angina pectoris: Secondary | ICD-10-CM

## 2021-08-06 DIAGNOSIS — E785 Hyperlipidemia, unspecified: Secondary | ICD-10-CM

## 2021-08-06 DIAGNOSIS — I252 Old myocardial infarction: Secondary | ICD-10-CM

## 2021-08-06 DIAGNOSIS — Z9911 Dependence on respirator [ventilator] status: Secondary | ICD-10-CM

## 2021-08-06 HISTORY — PX: RADIAL ARTERY HARVEST: SHX5067

## 2021-08-06 HISTORY — PX: TEE WITHOUT CARDIOVERSION: SHX5443

## 2021-08-06 HISTORY — PX: CORONARY ARTERY BYPASS GRAFT: SHX141

## 2021-08-06 LAB — POCT I-STAT 7, (LYTES, BLD GAS, ICA,H+H)
Acid-Base Excess: 0 mmol/L (ref 0.0–2.0)
Acid-Base Excess: 0 mmol/L (ref 0.0–2.0)
Acid-base deficit: 1 mmol/L (ref 0.0–2.0)
Acid-base deficit: 1 mmol/L (ref 0.0–2.0)
Acid-base deficit: 2 mmol/L (ref 0.0–2.0)
Acid-base deficit: 5 mmol/L — ABNORMAL HIGH (ref 0.0–2.0)
Acid-base deficit: 8 mmol/L — ABNORMAL HIGH (ref 0.0–2.0)
Acid-base deficit: 6 mmol/L — ABNORMAL HIGH (ref 0.0–2.0)
Acid-base deficit: 2 mmol/L (ref 0.0–2.0)
Bicarbonate: 25.6 mmol/L (ref 20.0–28.0)
Bicarbonate: 26.4 mmol/L (ref 20.0–28.0)
Bicarbonate: 24 mmol/L (ref 20.0–28.0)
Bicarbonate: 25.1 mmol/L (ref 20.0–28.0)
Bicarbonate: 24.2 mmol/L (ref 20.0–28.0)
Bicarbonate: 23 mmol/L (ref 20.0–28.0)
Bicarbonate: 19.2 mmol/L — ABNORMAL LOW (ref 20.0–28.0)
Bicarbonate: 19.7 mmol/L — ABNORMAL LOW (ref 20.0–28.0)
Bicarbonate: 23 mmol/L (ref 20.0–28.0)
Calcium, Ion: 1.28 mmol/L (ref 1.15–1.40)
Calcium, Ion: 1.04 mmol/L — ABNORMAL LOW (ref 1.15–1.40)
Calcium, Ion: 1.07 mmol/L — ABNORMAL LOW (ref 1.15–1.40)
Calcium, Ion: 1.09 mmol/L — ABNORMAL LOW (ref 1.15–1.40)
Calcium, Ion: 1.39 mmol/L (ref 1.15–1.40)
Calcium, Ion: 1.3 mmol/L (ref 1.15–1.40)
Calcium, Ion: 1.14 mmol/L — ABNORMAL LOW (ref 1.15–1.40)
Calcium, Ion: 1.11 mmol/L — ABNORMAL LOW (ref 1.15–1.40)
Calcium, Ion: 1.18 mmol/L (ref 1.15–1.40)
HCT: 49 % (ref 39.0–52.0)
HCT: 38 % — ABNORMAL LOW (ref 39.0–52.0)
HCT: 38 % — ABNORMAL LOW (ref 39.0–52.0)
HCT: 38 % — ABNORMAL LOW (ref 39.0–52.0)
HCT: 40 % (ref 39.0–52.0)
HCT: 45 % (ref 39.0–52.0)
HCT: 39 % (ref 39.0–52.0)
HCT: 39 % (ref 39.0–52.0)
HCT: 41 % (ref 39.0–52.0)
Hemoglobin: 16.7 g/dL (ref 13.0–17.0)
Hemoglobin: 12.9 g/dL — ABNORMAL LOW (ref 13.0–17.0)
Hemoglobin: 12.9 g/dL — ABNORMAL LOW (ref 13.0–17.0)
Hemoglobin: 12.9 g/dL — ABNORMAL LOW (ref 13.0–17.0)
Hemoglobin: 13.6 g/dL (ref 13.0–17.0)
Hemoglobin: 15.3 g/dL (ref 13.0–17.0)
Hemoglobin: 13.3 g/dL (ref 13.0–17.0)
Hemoglobin: 13.3 g/dL (ref 13.0–17.0)
Hemoglobin: 13.9 g/dL (ref 13.0–17.0)
O2 Saturation: 100 %
O2 Saturation: 100 %
O2 Saturation: 100 %
O2 Saturation: 100 %
O2 Saturation: 100 %
O2 Saturation: 95 %
O2 Saturation: 94 %
O2 Saturation: 95 %
O2 Saturation: 95 %
Patient temperature: 36.4
Patient temperature: 36.3
Patient temperature: 36.9
Patient temperature: 37.6
Potassium: 4.3 mmol/L (ref 3.5–5.1)
Potassium: 4.9 mmol/L (ref 3.5–5.1)
Potassium: 5.9 mmol/L — ABNORMAL HIGH (ref 3.5–5.1)
Potassium: 5.9 mmol/L — ABNORMAL HIGH (ref 3.5–5.1)
Potassium: 4.6 mmol/L (ref 3.5–5.1)
Potassium: 4.2 mmol/L (ref 3.5–5.1)
Potassium: 4.2 mmol/L (ref 3.5–5.1)
Potassium: 4.3 mmol/L (ref 3.5–5.1)
Potassium: 4.4 mmol/L (ref 3.5–5.1)
Sodium: 138 mmol/L (ref 135–145)
Sodium: 136 mmol/L (ref 135–145)
Sodium: 134 mmol/L — ABNORMAL LOW (ref 135–145)
Sodium: 135 mmol/L (ref 135–145)
Sodium: 136 mmol/L (ref 135–145)
Sodium: 139 mmol/L (ref 135–145)
Sodium: 141 mmol/L (ref 135–145)
Sodium: 141 mmol/L (ref 135–145)
Sodium: 139 mmol/L (ref 135–145)
TCO2: 27 mmol/L (ref 22–32)
TCO2: 28 mmol/L (ref 22–32)
TCO2: 25 mmol/L (ref 22–32)
TCO2: 26 mmol/L (ref 22–32)
TCO2: 26 mmol/L (ref 22–32)
TCO2: 25 mmol/L (ref 22–32)
TCO2: 20 mmol/L — ABNORMAL LOW (ref 22–32)
TCO2: 21 mmol/L — ABNORMAL LOW (ref 22–32)
TCO2: 24 mmol/L (ref 22–32)
pCO2 arterial: 49.7 mmHg — ABNORMAL HIGH (ref 32–48)
pCO2 arterial: 50.1 mmHg — ABNORMAL HIGH (ref 32–48)
pCO2 arterial: 41.6 mmHg (ref 32–48)
pCO2 arterial: 42.9 mmHg (ref 32–48)
pCO2 arterial: 46.2 mmHg (ref 32–48)
pCO2 arterial: 53.8 mmHg — ABNORMAL HIGH (ref 32–48)
pCO2 arterial: 41.6 mmHg (ref 32–48)
pCO2 arterial: 38.8 mmHg (ref 32–48)
pCO2 arterial: 42.2 mmHg (ref 32–48)
pH, Arterial: 7.32 — ABNORMAL LOW (ref 7.35–7.45)
pH, Arterial: 7.329 — ABNORMAL LOW (ref 7.35–7.45)
pH, Arterial: 7.37 (ref 7.35–7.45)
pH, Arterial: 7.374 (ref 7.35–7.45)
pH, Arterial: 7.327 — ABNORMAL LOW (ref 7.35–7.45)
pH, Arterial: 7.235 — ABNORMAL LOW (ref 7.35–7.45)
pH, Arterial: 7.268 — ABNORMAL LOW (ref 7.35–7.45)
pH, Arterial: 7.314 — ABNORMAL LOW (ref 7.35–7.45)
pH, Arterial: 7.347 — ABNORMAL LOW (ref 7.35–7.45)
pO2, Arterial: 300 mmHg — ABNORMAL HIGH (ref 83–108)
pO2, Arterial: 334 mmHg — ABNORMAL HIGH (ref 83–108)
pO2, Arterial: 258 mmHg — ABNORMAL HIGH (ref 83–108)
pO2, Arterial: 293 mmHg — ABNORMAL HIGH (ref 83–108)
pO2, Arterial: 265 mmHg — ABNORMAL HIGH (ref 83–108)
pO2, Arterial: 87 mmHg (ref 83–108)
pO2, Arterial: 79 mmHg — ABNORMAL LOW (ref 83–108)
pO2, Arterial: 79 mmHg — ABNORMAL LOW (ref 83–108)
pO2, Arterial: 80 mmHg — ABNORMAL LOW (ref 83–108)

## 2021-08-06 LAB — COMPREHENSIVE METABOLIC PANEL
ALT: 44 U/L (ref 0–44)
AST: 35 U/L (ref 15–41)
Albumin: 3.7 g/dL (ref 3.5–5.0)
Alkaline Phosphatase: 72 U/L (ref 38–126)
Anion gap: 6 (ref 5–15)
BUN: 14 mg/dL (ref 8–23)
CO2: 24 mmol/L (ref 22–32)
Calcium: 9 mg/dL (ref 8.9–10.3)
Chloride: 108 mmol/L (ref 98–111)
Creatinine, Ser: 0.99 mg/dL (ref 0.61–1.24)
GFR, Estimated: >60 mL/min (ref >60)
Glucose, Bld: 108 mg/dL — ABNORMAL HIGH (ref 70–99)
Potassium: 4.3 mmol/L (ref 3.5–5.1)
Sodium: 138 mmol/L (ref 135–145)
Total Bilirubin: 0.7 mg/dL (ref 0.3–1.2)
Total Protein: 6.3 g/dL — ABNORMAL LOW (ref 6.5–8.1)

## 2021-08-06 LAB — MAGNESIUM: Magnesium: 2.8 mg/dL — ABNORMAL HIGH (ref 1.7–2.4)

## 2021-08-06 LAB — RESP PANEL BY RT-PCR (FLU A&B, COVID) ARPGX2
Influenza A by PCR: NEGATIVE
Influenza B by PCR: NEGATIVE
SARS Coronavirus 2 by RT PCR: NEGATIVE

## 2021-08-06 LAB — BASIC METABOLIC PANEL
Anion gap: 7 (ref 5–15)
BUN: 13 mg/dL (ref 8–23)
CO2: 23 mmol/L (ref 22–32)
Calcium: 7.6 mg/dL — ABNORMAL LOW (ref 8.9–10.3)
Chloride: 108 mmol/L (ref 98–111)
Creatinine, Ser: 0.96 mg/dL (ref 0.61–1.24)
GFR, Estimated: >60 mL/min (ref >60)
Glucose, Bld: 138 mg/dL — ABNORMAL HIGH (ref 70–99)
Potassium: 4.4 mmol/L (ref 3.5–5.1)
Sodium: 138 mmol/L (ref 135–145)

## 2021-08-06 LAB — POCT I-STAT, CHEM 8
BUN: 14 mg/dL (ref 8–23)
BUN: 14 mg/dL (ref 8–23)
BUN: 14 mg/dL (ref 8–23)
BUN: 14 mg/dL (ref 8–23)
Calcium, Ion: 1.29 mmol/L (ref 1.15–1.40)
Calcium, Ion: 1.09 mmol/L — ABNORMAL LOW (ref 1.15–1.40)
Calcium, Ion: 1.08 mmol/L — ABNORMAL LOW (ref 1.15–1.40)
Calcium, Ion: 1.1 mmol/L — ABNORMAL LOW (ref 1.15–1.40)
Chloride: 102 mmol/L (ref 98–111)
Chloride: 104 mmol/L (ref 98–111)
Chloride: 103 mmol/L (ref 98–111)
Chloride: 104 mmol/L (ref 98–111)
Creatinine, Ser: 0.8 mg/dL (ref 0.61–1.24)
Creatinine, Ser: 0.8 mg/dL (ref 0.61–1.24)
Creatinine, Ser: 0.8 mg/dL (ref 0.61–1.24)
Creatinine, Ser: 0.8 mg/dL (ref 0.61–1.24)
Glucose, Bld: 105 mg/dL — ABNORMAL HIGH (ref 70–99)
Glucose, Bld: 124 mg/dL — ABNORMAL HIGH (ref 70–99)
Glucose, Bld: 134 mg/dL — ABNORMAL HIGH (ref 70–99)
Glucose, Bld: 157 mg/dL — ABNORMAL HIGH (ref 70–99)
HCT: 49 % (ref 39.0–52.0)
HCT: 47 % (ref 39.0–52.0)
HCT: 38 % — ABNORMAL LOW (ref 39.0–52.0)
HCT: 39 % (ref 39.0–52.0)
Hemoglobin: 16.7 g/dL (ref 13.0–17.0)
Hemoglobin: 16 g/dL (ref 13.0–17.0)
Hemoglobin: 12.9 g/dL — ABNORMAL LOW (ref 13.0–17.0)
Hemoglobin: 13.3 g/dL (ref 13.0–17.0)
Potassium: 4.3 mmol/L (ref 3.5–5.1)
Potassium: 4.6 mmol/L (ref 3.5–5.1)
Potassium: 5.9 mmol/L — ABNORMAL HIGH (ref 3.5–5.1)
Potassium: 5.3 mmol/L — ABNORMAL HIGH (ref 3.5–5.1)
Sodium: 139 mmol/L (ref 135–145)
Sodium: 138 mmol/L (ref 135–145)
Sodium: 135 mmol/L (ref 135–145)
Sodium: 135 mmol/L (ref 135–145)
TCO2: 27 mmol/L (ref 22–32)
TCO2: 25 mmol/L (ref 22–32)
TCO2: 26 mmol/L (ref 22–32)
TCO2: 25 mmol/L (ref 22–32)

## 2021-08-06 LAB — POCT I-STAT EG7
Acid-base deficit: 2 mmol/L (ref 0.0–2.0)
Bicarbonate: 24.4 mmol/L (ref 20.0–28.0)
Calcium, Ion: 1.06 mmol/L — ABNORMAL LOW (ref 1.15–1.40)
HCT: 36 % — ABNORMAL LOW (ref 39.0–52.0)
Hemoglobin: 12.2 g/dL — ABNORMAL LOW (ref 13.0–17.0)
O2 Saturation: 85 %
Potassium: 6.6 mmol/L (ref 3.5–5.1)
Sodium: 135 mmol/L (ref 135–145)
TCO2: 26 mmol/L (ref 22–32)
pCO2, Ven: 47.4 mmHg (ref 44–60)
pH, Ven: 7.32 (ref 7.25–7.43)
pO2, Ven: 54 mmHg — ABNORMAL HIGH (ref 32–45)

## 2021-08-06 LAB — CBC
HCT: 52 % (ref 39.0–52.0)
HCT: 45.6 % (ref 39.0–52.0)
HCT: 41.7 % (ref 39.0–52.0)
Hemoglobin: 17.9 g/dL — ABNORMAL HIGH (ref 13.0–17.0)
Hemoglobin: 15.4 g/dL (ref 13.0–17.0)
Hemoglobin: 14.3 g/dL (ref 13.0–17.0)
MCH: 31.9 pg (ref 26.0–34.0)
MCH: 32.2 pg (ref 26.0–34.0)
MCH: 32.1 pg (ref 26.0–34.0)
MCHC: 34.4 g/dL (ref 30.0–36.0)
MCHC: 33.8 g/dL (ref 30.0–36.0)
MCHC: 34.3 g/dL (ref 30.0–36.0)
MCV: 92.7 fL (ref 80.0–100.0)
MCV: 95.2 fL (ref 80.0–100.0)
MCV: 93.7 fL (ref 80.0–100.0)
Platelets: 201 10*3/uL (ref 150–400)
Platelets: 155 10*3/uL (ref 150–400)
Platelets: 163 10*3/uL (ref 150–400)
RBC: 5.61 MIL/uL (ref 4.22–5.81)
RBC: 4.79 MIL/uL (ref 4.22–5.81)
RBC: 4.45 MIL/uL (ref 4.22–5.81)
RDW: 12.8 % (ref 11.5–15.5)
RDW: 12.7 % (ref 11.5–15.5)
RDW: 12.7 % (ref 11.5–15.5)
WBC: 9.3 10*3/uL (ref 4.0–10.5)
WBC: 19.3 10*3/uL — ABNORMAL HIGH (ref 4.0–10.5)
WBC: 16.3 10*3/uL — ABNORMAL HIGH (ref 4.0–10.5)
nRBC: 0 % (ref 0.0–0.2)
nRBC: 0 % (ref 0.0–0.2)
nRBC: 0 % (ref 0.0–0.2)

## 2021-08-06 LAB — APTT
aPTT: 175 seconds (ref 24–36)
aPTT: 34 seconds (ref 24–36)

## 2021-08-06 LAB — PROTIME-INR
INR: 1.2 (ref 0.8–1.2)
INR: 1.5 — ABNORMAL HIGH (ref 0.8–1.2)
Prothrombin Time: 14.7 seconds (ref 11.4–15.2)
Prothrombin Time: 17.7 seconds — ABNORMAL HIGH (ref 11.4–15.2)

## 2021-08-06 LAB — HEMOGLOBIN AND HEMATOCRIT, BLOOD
HCT: 38.7 % — ABNORMAL LOW (ref 39.0–52.0)
Hemoglobin: 13.2 g/dL (ref 13.0–17.0)

## 2021-08-06 LAB — GLUCOSE, CAPILLARY
Glucose-Capillary: 137 mg/dL — ABNORMAL HIGH (ref 70–99)
Glucose-Capillary: 151 mg/dL — ABNORMAL HIGH (ref 70–99)
Glucose-Capillary: 134 mg/dL — ABNORMAL HIGH (ref 70–99)
Glucose-Capillary: 130 mg/dL — ABNORMAL HIGH (ref 70–99)
Glucose-Capillary: 121 mg/dL — ABNORMAL HIGH (ref 70–99)
Glucose-Capillary: 148 mg/dL — ABNORMAL HIGH (ref 70–99)
Glucose-Capillary: 156 mg/dL — ABNORMAL HIGH (ref 70–99)
Glucose-Capillary: 130 mg/dL — ABNORMAL HIGH (ref 70–99)
Glucose-Capillary: 141 mg/dL — ABNORMAL HIGH (ref 70–99)
Glucose-Capillary: 117 mg/dL — ABNORMAL HIGH (ref 70–99)

## 2021-08-06 LAB — PLATELET COUNT: Platelets: 176 10*3/uL (ref 150–400)

## 2021-08-06 LAB — HEPARIN LEVEL (UNFRACTIONATED): Heparin Unfractionated: 0.72 IU/mL — ABNORMAL HIGH (ref 0.30–0.70)

## 2021-08-06 SURGERY — CORONARY ARTERY BYPASS GRAFTING (CABG)
Anesthesia: General | Site: Chest

## 2021-08-06 MED ORDER — FENTANYL CITRATE (PF) 250 MCG/5ML IJ SOLN
INTRAMUSCULAR | Status: AC
  Filled 2021-08-06: qty 5

## 2021-08-06 MED ORDER — PANTOPRAZOLE SODIUM 40 MG PO TBEC
40.0000 mg | DELAYED_RELEASE_TABLET | Freq: Every day | ORAL | Status: DC
  Administered 2021-08-08 – 2021-08-10 (×3): 40 mg via ORAL
  Filled 2021-08-06 (×3): qty 1

## 2021-08-06 MED ORDER — MIDAZOLAM HCL (PF) 5 MG/ML IJ SOLN
INTRAMUSCULAR | Status: DC | PRN
Start: 2021-08-06 — End: 2021-08-06
  Administered 2021-08-06 (×2): 2 mg via INTRAVENOUS
  Administered 2021-08-06: 1 mg via INTRAVENOUS
  Administered 2021-08-06: 2 mg via INTRAVENOUS
  Administered 2021-08-06: 3 mg via INTRAVENOUS

## 2021-08-06 MED ORDER — ORAL CARE MOUTH RINSE
15.0000 mL | Freq: Two times a day (BID) | OROMUCOSAL | Status: DC
  Administered 2021-08-07 – 2021-08-09 (×4): 15 mL via OROMUCOSAL

## 2021-08-06 MED ORDER — DEXTROSE 50 % IV SOLN: 0.0000 mL | INTRAVENOUS | Status: DC | PRN

## 2021-08-06 MED ORDER — GLYCOPYRROLATE 0.2 MG/ML IJ SOLN
INTRAMUSCULAR | Status: DC | PRN
  Administered 2021-08-06: .1 mg via INTRAVENOUS

## 2021-08-06 MED ORDER — ACETAMINOPHEN 160 MG/5ML PO SOLN: 1000.0000 mg | Freq: Four times a day (QID) | ORAL | Status: DC

## 2021-08-06 MED ORDER — ONDANSETRON HCL 4 MG/2ML IJ SOLN: 4.0000 mg | Freq: Four times a day (QID) | INTRAMUSCULAR | Status: DC | PRN

## 2021-08-06 MED ORDER — ACETAMINOPHEN 650 MG RE SUPP
650.0000 mg | Freq: Once | RECTAL | Status: AC
  Administered 2021-08-06: 650 mg via RECTAL

## 2021-08-06 MED ORDER — CHLORHEXIDINE GLUCONATE CLOTH 2 % EX PADS
6.0000 | MEDICATED_PAD | Freq: Once | CUTANEOUS | Status: AC
  Administered 2021-08-06: 6 via TOPICAL

## 2021-08-06 MED ORDER — NITROGLYCERIN IN D5W 200-5 MCG/ML-% IV SOLN: 0.0000 ug/min | INTRAVENOUS | Status: DC

## 2021-08-06 MED ORDER — SODIUM BICARBONATE 8.4 % IV SOLN
50.0000 meq | Freq: Once | INTRAVENOUS | Status: AC
  Administered 2021-08-06: 50 meq via INTRAVENOUS

## 2021-08-06 MED ORDER — HEPARIN SODIUM (PORCINE) 1000 UNIT/ML IJ SOLN
INTRAMUSCULAR | Status: DC | PRN
  Administered 2021-08-06: 31000 [IU] via INTRAVENOUS

## 2021-08-06 MED ORDER — VANCOMYCIN HCL IN DEXTROSE 1-5 GM/200ML-% IV SOLN
1000.0000 mg | Freq: Once | INTRAVENOUS | Status: AC
  Administered 2021-08-06: 1000 mg via INTRAVENOUS
  Filled 2021-08-06: qty 200

## 2021-08-06 MED ORDER — DEXMEDETOMIDINE HCL IN NACL 400 MCG/100ML IV SOLN
0.0000 ug/kg/h | INTRAVENOUS | Status: DC
  Administered 2021-08-06: 0.6 ug/kg/h via INTRAVENOUS
  Filled 2021-08-06: qty 100

## 2021-08-06 MED ORDER — SODIUM CHLORIDE 0.9% FLUSH: 3.0000 mL | INTRAVENOUS | Status: DC | PRN

## 2021-08-06 MED ORDER — HEPARIN SODIUM (PORCINE) 1000 UNIT/ML IJ SOLN
INTRAMUSCULAR | Status: AC
  Filled 2021-08-06: qty 1

## 2021-08-06 MED ORDER — ACETAMINOPHEN 500 MG PO TABS
1000.0000 mg | ORAL_TABLET | Freq: Four times a day (QID) | ORAL | Status: DC
  Administered 2021-08-06 – 2021-08-10 (×14): 1000 mg via ORAL
  Filled 2021-08-06 (×14): qty 2

## 2021-08-06 MED ORDER — DEXAMETHASONE SODIUM PHOSPHATE 10 MG/ML IJ SOLN
INTRAMUSCULAR | Status: AC
  Filled 2021-08-06: qty 1

## 2021-08-06 MED ORDER — MIDAZOLAM HCL 2 MG/2ML IJ SOLN: 2.0000 mg | INTRAMUSCULAR | Status: DC | PRN

## 2021-08-06 MED ORDER — ROCURONIUM BROMIDE 10 MG/ML (PF) SYRINGE
PREFILLED_SYRINGE | INTRAVENOUS | Status: AC
  Filled 2021-08-06: qty 10

## 2021-08-06 MED ORDER — PROTAMINE SULFATE 10 MG/ML IV SOLN
INTRAVENOUS | Status: DC | PRN
Start: 2021-08-06 — End: 2021-08-06
  Administered 2021-08-06: 280 mg via INTRAVENOUS
  Administered 2021-08-06: 30 mg via INTRAVENOUS

## 2021-08-06 MED ORDER — SODIUM CHLORIDE 0.9 % IV SOLN: INTRAVENOUS | Status: DC

## 2021-08-06 MED ORDER — LACTATED RINGERS IV SOLN: INTRAVENOUS | Status: DC | PRN

## 2021-08-06 MED ORDER — POTASSIUM CHLORIDE 10 MEQ/50ML IV SOLN: 10.0000 meq | INTRAVENOUS | Status: AC

## 2021-08-06 MED ORDER — EPHEDRINE 5 MG/ML INJ
INTRAVENOUS | Status: AC
  Filled 2021-08-06: qty 5

## 2021-08-06 MED ORDER — ASPIRIN EC 325 MG PO TBEC: 325.0000 mg | DELAYED_RELEASE_TABLET | Freq: Every day | ORAL | Status: DC

## 2021-08-06 MED ORDER — FAMOTIDINE IN NACL 20-0.9 MG/50ML-% IV SOLN
20.0000 mg | Freq: Two times a day (BID) | INTRAVENOUS | Status: AC
  Administered 2021-08-06: 20 mg via INTRAVENOUS
  Filled 2021-08-06: qty 50

## 2021-08-06 MED ORDER — ALBUMIN HUMAN 5 % IV SOLN
250.0000 mL | INTRAVENOUS | Status: AC | PRN
  Administered 2021-08-06 (×4): 12.5 g via INTRAVENOUS
  Filled 2021-08-06 (×2): qty 250

## 2021-08-06 MED ORDER — CHLORHEXIDINE GLUCONATE CLOTH 2 % EX PADS
6.0000 | MEDICATED_PAD | Freq: Once | CUTANEOUS | Status: AC
  Administered 2021-08-05: 6 via TOPICAL

## 2021-08-06 MED ORDER — MAGNESIUM SULFATE 4 GM/100ML IV SOLN
4.0000 g | Freq: Once | INTRAVENOUS | Status: AC
  Administered 2021-08-06: 4 g via INTRAVENOUS
  Filled 2021-08-06: qty 100

## 2021-08-06 MED ORDER — PHENYLEPHRINE 40 MCG/ML (10ML) SYRINGE FOR IV PUSH (FOR BLOOD PRESSURE SUPPORT)
PREFILLED_SYRINGE | INTRAVENOUS | Status: AC
  Filled 2021-08-06: qty 10

## 2021-08-06 MED ORDER — METOPROLOL TARTRATE 12.5 MG HALF TABLET
12.5000 mg | ORAL_TABLET | Freq: Two times a day (BID) | ORAL | Status: DC
  Administered 2021-08-06 – 2021-08-10 (×7): 12.5 mg via ORAL
  Filled 2021-08-06 (×8): qty 1

## 2021-08-06 MED ORDER — FENTANYL CITRATE (PF) 250 MCG/5ML IJ SOLN
INTRAMUSCULAR | Status: DC | PRN
  Administered 2021-08-06: 150 ug via INTRAVENOUS
  Administered 2021-08-06: 250 ug via INTRAVENOUS
  Administered 2021-08-06: 200 ug via INTRAVENOUS
  Administered 2021-08-06: 100 ug via INTRAVENOUS
  Administered 2021-08-06: 50 ug via INTRAVENOUS
  Administered 2021-08-06: 150 ug via INTRAVENOUS
  Administered 2021-08-06 (×2): 100 ug via INTRAVENOUS
  Administered 2021-08-06: 50 ug via INTRAVENOUS
  Administered 2021-08-06: 100 ug via INTRAVENOUS

## 2021-08-06 MED ORDER — PROPOFOL 10 MG/ML IV BOLUS
INTRAVENOUS | Status: AC
  Filled 2021-08-06: qty 20

## 2021-08-06 MED ORDER — BISACODYL 10 MG RE SUPP: 10.0000 mg | Freq: Every day | RECTAL | Status: DC

## 2021-08-06 MED ORDER — METOPROLOL TARTRATE 25 MG/10 ML ORAL SUSPENSION: 12.5000 mg | Freq: Two times a day (BID) | ORAL | Status: DC

## 2021-08-06 MED ORDER — ROCURONIUM BROMIDE 10 MG/ML (PF) SYRINGE
PREFILLED_SYRINGE | INTRAVENOUS | Status: DC | PRN
  Administered 2021-08-06: 40 mg via INTRAVENOUS
  Administered 2021-08-06: 20 mg via INTRAVENOUS
  Administered 2021-08-06: 60 mg via INTRAVENOUS
  Administered 2021-08-06 (×2): 40 mg via INTRAVENOUS

## 2021-08-06 MED ORDER — CHLORHEXIDINE GLUCONATE 0.12 % MT SOLN
15.0000 mL | OROMUCOSAL | Status: AC
  Administered 2021-08-06: 15 mL via OROMUCOSAL

## 2021-08-06 MED ORDER — ORAL CARE MOUTH RINSE
15.0000 mL | OROMUCOSAL | Status: DC
  Administered 2021-08-06: 15 mL via OROMUCOSAL

## 2021-08-06 MED ORDER — EPHEDRINE SULFATE-NACL 50-0.9 MG/10ML-% IV SOSY
PREFILLED_SYRINGE | INTRAVENOUS | Status: DC | PRN
Start: 2021-08-06 — End: 2021-08-06
  Administered 2021-08-06 (×3): 5 mg via INTRAVENOUS
  Administered 2021-08-06: 10 mg via INTRAVENOUS

## 2021-08-06 MED ORDER — BISACODYL 5 MG PO TBEC
10.0000 mg | DELAYED_RELEASE_TABLET | Freq: Every day | ORAL | Status: DC
  Administered 2021-08-07 – 2021-08-08 (×2): 10 mg via ORAL
  Filled 2021-08-06 (×2): qty 2

## 2021-08-06 MED ORDER — DOCUSATE SODIUM 100 MG PO CAPS
200.0000 mg | ORAL_CAPSULE | Freq: Every day | ORAL | Status: DC
  Administered 2021-08-07 – 2021-08-10 (×4): 200 mg via ORAL
  Filled 2021-08-06 (×4): qty 2

## 2021-08-06 MED ORDER — SODIUM CHLORIDE 0.9 % IV SOLN: 250.0000 mL | INTRAVENOUS | Status: DC

## 2021-08-06 MED ORDER — LACTATED RINGERS IV SOLN: INTRAVENOUS | Status: DC

## 2021-08-06 MED ORDER — PHENYLEPHRINE HCL-NACL 20-0.9 MG/250ML-% IV SOLN
INTRAVENOUS | Status: DC | PRN
  Administered 2021-08-06: 20 ug/min via INTRAVENOUS

## 2021-08-06 MED ORDER — CHLORHEXIDINE GLUCONATE CLOTH 2 % EX PADS
6.0000 | MEDICATED_PAD | Freq: Every day | CUTANEOUS | Status: DC
  Administered 2021-08-06: 6 via TOPICAL

## 2021-08-06 MED ORDER — SODIUM CHLORIDE (PF) 0.9 % IJ SOLN: OROMUCOSAL | Status: DC | PRN

## 2021-08-06 MED ORDER — ASPIRIN 81 MG PO CHEW: 324.0000 mg | CHEWABLE_TABLET | Freq: Every day | ORAL | Status: DC

## 2021-08-06 MED ORDER — CHLORHEXIDINE GLUCONATE 0.12% ORAL RINSE (MEDLINE KIT): 15.0000 mL | Freq: Two times a day (BID) | OROMUCOSAL | Status: DC

## 2021-08-06 MED ORDER — PHENYLEPHRINE 40 MCG/ML (10ML) SYRINGE FOR IV PUSH (FOR BLOOD PRESSURE SUPPORT)
PREFILLED_SYRINGE | INTRAVENOUS | Status: DC | PRN
Start: 2021-08-06 — End: 2021-08-06
  Administered 2021-08-06 (×3): 40 ug via INTRAVENOUS
  Administered 2021-08-06: 20 ug via INTRAVENOUS
  Administered 2021-08-06 (×2): 40 ug via INTRAVENOUS
  Administered 2021-08-06 (×2): 80 ug via INTRAVENOUS
  Administered 2021-08-06 (×3): 40 ug via INTRAVENOUS

## 2021-08-06 MED ORDER — 0.9 % SODIUM CHLORIDE (POUR BTL) OPTIME
TOPICAL | Status: DC | PRN
Start: 2021-08-06 — End: 2021-08-06
  Administered 2021-08-06: 5000 mL

## 2021-08-06 MED ORDER — PLASMA-LYTE A IV SOLN: INTRAVENOUS | Status: DC | PRN

## 2021-08-06 MED ORDER — CHLORHEXIDINE GLUCONATE 0.12 % MT SOLN
15.0000 mL | Freq: Once | OROMUCOSAL | Status: AC
  Administered 2021-08-06: 15 mL via OROMUCOSAL
  Filled 2021-08-06: qty 15

## 2021-08-06 MED ORDER — MORPHINE SULFATE (PF) 2 MG/ML IV SOLN
1.0000 mg | INTRAVENOUS | Status: DC | PRN
  Administered 2021-08-06: 2 mg via INTRAVENOUS
  Filled 2021-08-06: qty 1

## 2021-08-06 MED ORDER — PROPOFOL 10 MG/ML IV BOLUS
INTRAVENOUS | Status: DC | PRN
  Administered 2021-08-06: 70 mg via INTRAVENOUS

## 2021-08-06 MED ORDER — CEFAZOLIN SODIUM-DEXTROSE 2-4 GM/100ML-% IV SOLN
2.0000 g | Freq: Three times a day (TID) | INTRAVENOUS | Status: AC
  Administered 2021-08-06 – 2021-08-08 (×6): 2 g via INTRAVENOUS
  Filled 2021-08-06 (×6): qty 100

## 2021-08-06 MED ORDER — INSULIN REGULAR(HUMAN) IN NACL 100-0.9 UT/100ML-% IV SOLN: INTRAVENOUS | Status: DC

## 2021-08-06 MED ORDER — METOPROLOL TARTRATE 5 MG/5ML IV SOLN: 2.5000 mg | INTRAVENOUS | Status: DC | PRN

## 2021-08-06 MED ORDER — ACETAMINOPHEN 160 MG/5ML PO SOLN: 650.0000 mg | Freq: Once | ORAL | Status: AC

## 2021-08-06 MED ORDER — DEXAMETHASONE SODIUM PHOSPHATE 10 MG/ML IJ SOLN
INTRAMUSCULAR | Status: DC | PRN
  Administered 2021-08-06: 10 mg via INTRAVENOUS

## 2021-08-06 MED ORDER — TRAMADOL HCL 50 MG PO TABS
50.0000 mg | ORAL_TABLET | ORAL | Status: DC | PRN
  Administered 2021-08-06 – 2021-08-07 (×2): 100 mg via ORAL
  Administered 2021-08-07: 50 mg via ORAL
  Administered 2021-08-07 – 2021-08-10 (×4): 100 mg via ORAL
  Filled 2021-08-06: qty 1
  Filled 2021-08-06 (×6): qty 2

## 2021-08-06 MED ORDER — PHENYLEPHRINE HCL-NACL 20-0.9 MG/250ML-% IV SOLN: 0.0000 ug/min | INTRAVENOUS | Status: DC

## 2021-08-06 MED ORDER — SODIUM CHLORIDE 0.9% FLUSH
3.0000 mL | Freq: Two times a day (BID) | INTRAVENOUS | Status: DC
  Administered 2021-08-07 – 2021-08-10 (×5): 3 mL via INTRAVENOUS

## 2021-08-06 MED ORDER — SODIUM CHLORIDE 0.45 % IV SOLN: INTRAVENOUS | Status: DC | PRN

## 2021-08-06 MED ORDER — LACTATED RINGERS IV SOLN
500.0000 mL | Freq: Once | INTRAVENOUS | Status: AC | PRN
  Administered 2021-08-06: 500 mL via INTRAVENOUS

## 2021-08-06 MED ORDER — PROPOFOL 10 MG/ML IV BOLUS
INTRAVENOUS | Status: AC
Start: 2021-08-06 — End: ?
  Filled 2021-08-06: qty 20

## 2021-08-06 MED ORDER — OXYCODONE HCL 5 MG PO TABS
5.0000 mg | ORAL_TABLET | ORAL | Status: DC | PRN
  Administered 2021-08-06 – 2021-08-07 (×5): 10 mg via ORAL
  Administered 2021-08-08: 5 mg via ORAL
  Filled 2021-08-06 (×2): qty 2
  Filled 2021-08-06: qty 1
  Filled 2021-08-06 (×3): qty 2

## 2021-08-06 MED ORDER — MIDAZOLAM HCL (PF) 10 MG/2ML IJ SOLN
INTRAMUSCULAR | Status: AC
  Filled 2021-08-06: qty 2

## 2021-08-06 SURGICAL SUPPLY — 110 items
ADH SKN CLS APL DERMABOND .7 (GAUZE/BANDAGES/DRESSINGS) ×3
APPLIER CLIP 9.375 SM OPEN (CLIP) ×4
APR CLP SM 9.3 20 MLT OPN (CLIP) ×3
BAG DECANTER FOR FLEXI CONT (MISCELLANEOUS) ×5 IMPLANT
BLADE CLIPPER SURG (BLADE) ×10 IMPLANT
BLADE STERNUM SYSTEM 6 (BLADE) ×5 IMPLANT
BLADE SURG 11 STRL SS (BLADE) ×1 IMPLANT
BLADE SURG 15 STRL LF DISP TIS (BLADE) ×4 IMPLANT
BLADE SURG 15 STRL SS (BLADE) ×4
BNDG ELASTIC 4X5.8 VLCR STR LF (GAUZE/BANDAGES/DRESSINGS) ×10 IMPLANT
BNDG ELASTIC 6X5.8 VLCR STR LF (GAUZE/BANDAGES/DRESSINGS) ×5 IMPLANT
BNDG GAUZE ELAST 4 BULKY (GAUZE/BANDAGES/DRESSINGS) ×6 IMPLANT
CABLE SURGICAL S-101-97-12 (CABLE) ×5 IMPLANT
CANISTER SUCT 3000ML PPV (MISCELLANEOUS) ×5 IMPLANT
CANNULA MC2 2 STG 29/37 NON-V (CANNULA) ×4 IMPLANT
CANNULA MC2 TWO STAGE (CANNULA) ×4
CANNULA NON VENT 20FR 12 (CANNULA) ×5 IMPLANT
CATH ROBINSON RED A/P 18FR (CATHETERS) ×10 IMPLANT
CLIP APPLIE 9.375 SM OPEN (CLIP) ×4 IMPLANT
CLIP FOGARTY SPRING 6M (CLIP) ×1 IMPLANT
CLIP RETRACTION 3.0MM CORONARY (MISCELLANEOUS) ×5 IMPLANT
CLIP VESOCCLUDE MED 24/CT (CLIP) IMPLANT
CLIP VESOCCLUDE SM WIDE 24/CT (CLIP) IMPLANT
CONN ST 1/2X1/2  BEN (MISCELLANEOUS) ×4
CONN ST 1/2X1/2 BEN (MISCELLANEOUS) ×4 IMPLANT
CONNECTOR BLAKE 2:1 CARIO BLK (MISCELLANEOUS) ×5 IMPLANT
CONTAINER PROTECT SURGISLUSH (MISCELLANEOUS) ×10 IMPLANT
COVER MAYO STAND STRL (DRAPES) ×5 IMPLANT
CUFF TOURN SGL QUICK 18X4 (TOURNIQUET CUFF) IMPLANT
CUFF TOURN SGL QUICK 24 (TOURNIQUET CUFF)
CUFF TRNQT CYL 24X4X16.5-23 (TOURNIQUET CUFF) IMPLANT
DEFOGGER ANTIFOG KIT (MISCELLANEOUS) IMPLANT
DERMABOND ADVANCED (GAUZE/BANDAGES/DRESSINGS) ×1
DERMABOND ADVANCED .7 DNX12 (GAUZE/BANDAGES/DRESSINGS) IMPLANT
DRAIN CHANNEL 19F RND (DRAIN) ×15 IMPLANT
DRAIN CONNECTOR BLAKE 1:1 (MISCELLANEOUS) ×5 IMPLANT
DRAPE CARDIOVASCULAR INCISE (DRAPES) ×4
DRAPE EXTREMITY T 121X128X90 (DISPOSABLE) ×5 IMPLANT
DRAPE HALF SHEET 40X57 (DRAPES) ×5 IMPLANT
DRAPE INCISE IOBAN 66X45 STRL (DRAPES) IMPLANT
DRAPE SRG 135X102X78XABS (DRAPES) ×4 IMPLANT
DRAPE WARM FLUID 44X44 (DRAPES) ×5 IMPLANT
DRSG AQUACEL AG ADV 3.5X10 (GAUZE/BANDAGES/DRESSINGS) ×4 IMPLANT
DRSG AQUACEL AG ADV 3.5X14 (GAUZE/BANDAGES/DRESSINGS) ×5 IMPLANT
DRSG COVADERM 4X14 (GAUZE/BANDAGES/DRESSINGS) ×5 IMPLANT
ELECT BLADE 4.0 EZ CLEAN MEGAD (MISCELLANEOUS) ×4
ELECT REM PT RETURN 9FT ADLT (ELECTROSURGICAL) ×8
ELECTRODE BLDE 4.0 EZ CLN MEGD (MISCELLANEOUS) ×4 IMPLANT
ELECTRODE REM PT RTRN 9FT ADLT (ELECTROSURGICAL) ×8 IMPLANT
FELT TEFLON 1X6 (MISCELLANEOUS) ×9 IMPLANT
GAUZE 4X4 16PLY ~~LOC~~+RFID DBL (SPONGE) ×10 IMPLANT
GAUZE SPONGE 4X4 12PLY STRL (GAUZE/BANDAGES/DRESSINGS) ×11 IMPLANT
GAUZE SPONGE 4X4 12PLY STRL LF (GAUZE/BANDAGES/DRESSINGS) ×3 IMPLANT
GEL ULTRASOUND 20GR AQUASONIC (MISCELLANEOUS) ×5 IMPLANT
GLOVE SURG ENC MOIS LTX SZ7 (GLOVE) ×10 IMPLANT
GLOVE SURG ENC TEXT LTX SZ7.5 (GLOVE) ×10 IMPLANT
GLOVE SURG MICRO LTX SZ6 (GLOVE) ×2 IMPLANT
GLOVE SURG MICRO LTX SZ7.5 (GLOVE) ×2 IMPLANT
GLOVE SURG UNDER POLY LF SZ9 (GLOVE) ×1 IMPLANT
GOWN STRL REUS W/ TWL LRG LVL3 (GOWN DISPOSABLE) ×16 IMPLANT
GOWN STRL REUS W/ TWL XL LVL3 (GOWN DISPOSABLE) ×8 IMPLANT
GOWN STRL REUS W/TWL LRG LVL3 (GOWN DISPOSABLE) ×20
GOWN STRL REUS W/TWL XL LVL3 (GOWN DISPOSABLE) ×8
HEMOSTAT POWDER SURGIFOAM 1G (HEMOSTASIS) ×15 IMPLANT
INSERT SUTURE HOLDER (MISCELLANEOUS) ×5 IMPLANT
KIT BASIN OR (CUSTOM PROCEDURE TRAY) ×5 IMPLANT
KIT SUCTION CATH 14FR (SUCTIONS) ×5 IMPLANT
KIT TURNOVER KIT B (KITS) ×10 IMPLANT
KIT VASOVIEW HEMOPRO 2 VH 4000 (KITS) ×5 IMPLANT
LEAD PACING MYOCARDI (MISCELLANEOUS) ×5 IMPLANT
MARKER GRAFT CORONARY BYPASS (MISCELLANEOUS) ×15 IMPLANT
MARKER SKIN DUAL TIP RULER LAB (MISCELLANEOUS) ×1 IMPLANT
NS IRRIG 1000ML POUR BTL (IV SOLUTION) ×25 IMPLANT
PACK ACCESSORY CANNULA KIT (KITS) ×5 IMPLANT
PACK E OPEN HEART (SUTURE) ×5 IMPLANT
PACK OPEN HEART (CUSTOM PROCEDURE TRAY) ×5 IMPLANT
PAD ARMBOARD 7.5X6 YLW CONV (MISCELLANEOUS) ×19 IMPLANT
PAD ELECT DEFIB RADIOL ZOLL (MISCELLANEOUS) ×5 IMPLANT
PENCIL BUTTON HOLSTER BLD 10FT (ELECTRODE) ×5 IMPLANT
POSITIONER HEAD DONUT 9IN (MISCELLANEOUS) ×5 IMPLANT
PUNCH AORTIC ROTATE 4.0MM (MISCELLANEOUS) ×5 IMPLANT
SET MPS 3-ND DEL (MISCELLANEOUS) ×1 IMPLANT
SHEARS HARMONIC 9CM CVD (BLADE) ×5 IMPLANT
SPONGE T-LAP 18X18 ~~LOC~~+RFID (SPONGE) ×36 IMPLANT
SUPPORT HEART JANKE-BARRON (MISCELLANEOUS) ×5 IMPLANT
SUT BONE WAX W31G (SUTURE) ×5 IMPLANT
SUT ETHIBOND X763 2 0 SH 1 (SUTURE) ×10 IMPLANT
SUT MNCRL AB 3-0 PS2 18 (SUTURE) ×10 IMPLANT
SUT MNCRL AB 4-0 PS2 18 (SUTURE) ×1 IMPLANT
SUT PDS AB 1 CTX 36 (SUTURE) ×10 IMPLANT
SUT PROLENE 4 0 SH DA (SUTURE) ×5 IMPLANT
SUT PROLENE 5 0 C 1 36 (SUTURE) ×15 IMPLANT
SUT PROLENE 7 0 BV 1 (SUTURE) ×1 IMPLANT
SUT PROLENE 7 0 BV1 MDA (SUTURE) ×6 IMPLANT
SUT STEEL 6MS V (SUTURE) ×10 IMPLANT
SUT VIC AB 2-0 CT1 27 (SUTURE) ×8
SUT VIC AB 2-0 CT1 TAPERPNT 27 (SUTURE) IMPLANT
SUT VIC AB 3-0 SH 27 (SUTURE)
SUT VIC AB 3-0 SH 27X BRD (SUTURE) IMPLANT
SUT VIC AB 3-0 X1 27 (SUTURE) IMPLANT
SYR 50ML SLIP (SYRINGE) IMPLANT
SYSTEM SAHARA CHEST DRAIN ATS (WOUND CARE) ×5 IMPLANT
TAPE CLOTH SURG 4X10 WHT LF (GAUZE/BANDAGES/DRESSINGS) ×1 IMPLANT
TAPE PAPER 2X10 WHT MICROPORE (GAUZE/BANDAGES/DRESSINGS) ×1 IMPLANT
TOWEL GREEN STERILE (TOWEL DISPOSABLE) ×10 IMPLANT
TOWEL GREEN STERILE FF (TOWEL DISPOSABLE) ×10 IMPLANT
TRAY FOLEY SLVR 16FR TEMP STAT (SET/KITS/TRAYS/PACK) ×5 IMPLANT
TUBING LAP HI FLOW INSUFFLATIO (TUBING) ×5 IMPLANT
UNDERPAD 30X36 HEAVY ABSORB (UNDERPADS AND DIAPERS) ×9 IMPLANT
WATER STERILE IRR 1000ML POUR (IV SOLUTION) ×10 IMPLANT

## 2021-08-06 NOTE — Anesthesia Postprocedure Evaluation (Signed)
Anesthesia Post Note ? ?Patient: Antonio [name] ? ?Procedure(s) Performed: CORONARY ARTERY BYPASS GRAFTING X4, With Left Radial Artery Harvest, Right Greater Saphenous Vein Harvested Endoscopically (Chest) ?RADIAL ARTERY HARVEST (Left: Arm Lower) ?TRANSESOPHAGEAL ECHOCARDIOGRAM (TEE) ? ?  ? ?Patient location during evaluation: SICU ?Anesthesia Type: General ?Level of consciousness: sedated ?Pain management: pain level controlled ?Vital Signs Assessment: post-procedure vital signs reviewed and stable ?Respiratory status: patient remains intubated per anesthesia plan ?Cardiovascular status: stable ?Postop Assessment: no apparent nausea or vomiting ?Anesthetic complications: no ? ? ?No notable events documented. ? ?Last Vitals:  ?Vitals:  ? 08/06/21 1530 08/06/21 1600  ?BP:  98/71  ?Pulse: 67 60  ?Resp: 14 16  ?Temp: (!) 36.3 ?C (!) 36.3 ?C  ?SpO2: 95% 95%  ?  ?Last Pain:  ?Vitals:  ? 08/06/21 0425  ?TempSrc: Oral  ?PainSc:   ? ? ?  ?  ?  ?  ?  ?  ? ?Suzette Battiest E ? ? ? ? ?

## 2021-08-06 NOTE — Anesthesia Procedure Notes (Signed)
Procedure Name: Intubation ?Date/Time: 08/06/2021 7:59 AM ?Performed by: Vonna Drafts, CRNA ?Pre-anesthesia Checklist: Patient identified, Emergency Drugs available, Suction available and Patient being monitored ?Patient Re-evaluated:Patient Re-evaluated prior to induction ?Oxygen Delivery Method: Circle system utilized ?Preoxygenation: Pre-oxygenation with 100% oxygen ?Induction Type: IV induction ?Ventilation: Mask ventilation without difficulty and Oral airway inserted - appropriate to patient size ?Laryngoscope Size: Mac and 4 ?Grade View: Grade I ?Tube type: Oral ?Tube size: 8.0 mm ?Number of attempts: 1 ?Airway Equipment and Method: Stylet ?Placement Confirmation: ETT inserted through vocal cords under direct vision, positive ETCO2 and breath sounds checked- equal and bilateral ?Secured at: 23 cm ?Tube secured with: Tape ?Dental Injury: Teeth and Oropharynx as per pre-operative assessment  ? ? ? ? ?

## 2021-08-06 NOTE — Progress Notes (Signed)
1700:  ?Patient's ABG prior to extubation pH 7.25, CO2 41. HCO3 19. ?Patient alert and following commands. ?MD Lightfoot notified.  ?Orders for 1 amp bicarb then extubate. ?Patient extubated to 4L , able to use IS.  ? ?8118: ?Post-extubation ABG 7.31, CO2 38, HCO3 19.7. ?Patient alert and following commands.  ?DO Clark notified.  ?Orders for 1 amp bicarb.  ?

## 2021-08-06 NOTE — Progress Notes (Signed)
?   ?  [address] ?      [place] [address] ?            [phone]      ? ?No events ? ?Vitals:  ? 08/06/21 0724 08/06/21 0725  ?BP:  (!) 142/90  ?Pulse: 66   ?Resp: 20   ?Temp:    ?SpO2: 99%   ? ?Alert NAD ?Sinus ?EWOB ? ?OR today for CABG, L radial artery harvest ? ?Antonio [name] ? ?

## 2021-08-06 NOTE — Transfer of Care (Signed)
Immediate Anesthesia Transfer of Care Note ? ?Patient: Antonio [name] ? ?Procedure(s) Performed: CORONARY ARTERY BYPASS GRAFTING X4, With Left Radial Artery Harvest, Right Greater Saphenous Vein Harvested Endoscopically (Chest) ?RADIAL ARTERY HARVEST (Left: Arm Lower) ?TRANSESOPHAGEAL ECHOCARDIOGRAM (TEE) ? ?Patient Location: SICU ? ?Anesthesia Type:General ? ?Level of Consciousness: Patient remains intubated per anesthesia plan ? ?Airway & Oxygen Therapy: Patient remains intubated per anesthesia plan and Patient placed on Ventilator (see vital sign flow sheet for setting) ? ?Post-op Assessment: Report given to RN and Post -op Vital signs reviewed and stable ? ?Post vital signs: Reviewed and stable ? ?Last Vitals:  ?Vitals Value Taken Time  ?BP    ?Temp    ?Pulse 70 08/06/21 1308  ?Resp 12 08/06/21 1308  ?SpO2 95 % 08/06/21 1308  ?Vitals shown include unvalidated device data. ? ?Last Pain:  ?Vitals:  ? 08/06/21 0425  ?TempSrc: Oral  ?PainSc:   ?   ? ?Patients Stated Pain Goal: 0 (08/05/21 0830) ? ?Complications: No notable events documented. ?

## 2021-08-06 NOTE — Consult Note (Signed)
? ?NAME:  Antonio [name], MRN:  [id number], DOB:  01/22/1961, LOS: 4 ?ADMISSION DATE:  08/01/2021, CONSULTATION DATE:  08/06/21 ?REFERRING MD:  Kipp Brood, CHIEF COMPLAINT:  post-CABG  ? ?History of Present Illness:  ?Antonio [name] is a 61 y/o gentleman who presented on 3/23 with DOE and an episode of chest tightness that was relived with rest. He had outpatient troponin levels drawn that were elevated and he was referred to the ED. He has a history of CAD in multiple first degree relatives. He has a history of HLD and HTN managed with lifestyle interventions. Non-smoker. He was admitted to Cardiology and underwent LHC demonstrating 3 vessel CAD. He underwent 4-vessel CABG on 08/06/21 and was transferred to the ICU following his surgery. PCCM consulted for post-op ICU care. ? ?Pertinent  Medical History  ?CAD ?HTN ?HLD ?Basal cell carcinoma ? ?Significant Hospital Events: ?Including procedures, antibiotic start and stop dates in addition to other pertinent events   ?3/23 admitted for NSTEMI, LHC> 3 vessel disease ?3/28 4-vessel CABG ? ?Interim History / Subjective:  ? ? ?Objective   ?Blood pressure (!) 142/90, pulse 66, temperature 98.9 ?F (37.2 ?C), temperature source Oral, resp. rate 12, height '5\' 8"'$  (1.727 m), weight 93.4 kg, SpO2 99 %. ?   ?Vent Mode: SIMV;PSV;PRVC ?FiO2 (%):  [50 %] 50 % ?Set Rate:  [12 bmp] 12 bmp ?Vt Set:  [540 mL] 540 mL ?Pressure Support:  [10 cmH20] 10 cmH20 ?Plateau Pressure:  [16 cmH20] 16 cmH20  ? ?Intake/Output Summary (Last 24 hours) at 08/06/2021 1312 ?Last data filed at 08/06/2021 1234 ?Gross per 24 hour  ?Intake 2390 ml  ?Output 1216 ml  ?Net 1174 ml  ? ?Filed Weights  ? 08/01/21 1017  ?Weight: 93.4 kg  ? ? ?Examination: ?General: critically ill appearing man lying in bed in NAD, intubated, sedated ?HENT: [place]/AT, eyes anicteric ?Lungs: CTAB, synchronous with MV ?Cardiovascular: S1S2, RRR. Pacing wires in place, not requiring pacing currently. Chest tubes with small volume bloody output. ?Abdomen:  obese, soft, NT ?Extremities: LUE radial site bandaged, RLE bandaged ?Neuro: RASS -5, not breathing over vent, pinpoint pupils ?GU: clear urine, foley ? ?CXR from admission personally reviewed> ill defined LLL opacity ?Cxr today> chest tubes in place, ETT about 5 cm above carina. RIJ CVC, pacing wires. ? ?Resolved Hospital Problem list   ? ? ?Assessment & Plan:  ? ?CAD with NSTEMI, s/p CABG x 4 on 3/23 (L radial and SVG grafts) ?-post-op care per TCTS ?-complete post-op antibiotics ?-pain control per protocol- morphine, oxycodone, tramadol ?-monitor electrolytes and replete PRN ?-tele monitoring ?-daily aspirin and statin ?-can start metoprolol once off pressors ?-plavix once chest tubes are able to be removed ? ?Hypocalcemia ?-con't to monitor on blood gases ? ?Post-op ventilator management ?-rapid wean protocol ?-pulmonary hygiene ? ?Hyperglycemia, prediabetes ?-insulin gtt; goal BG <180 ?-needs OP follow up of A1c ? ?Obesity ?-recommend modest weight loss long-term ? ?Lung nodule ?-needs OP follow up CT scan ?-recommend outpatient follow up imaging as lower lobe lesions are not likely to be well-visualized on portable CXRs ? ?Best Practice (right click and "Reselect all SmartList Selections" daily)  ? ?Diet/type: NPO ?DVT prophylaxis: not indicated ?GI prophylaxis: H2B and PPI ?Lines: Central line, Arterial Line, and yes and it is still needed ?Foley:  Yes, and it is still needed ?Code Status:  full code ?Last date of multidisciplinary goals of care discussion '[ ]'$  ? ?Labs   ?CBC: ?Recent Labs  ?Lab 08/02/21 ?0804 08/03/21 ?0252 08/04/21 ?  0011 08/05/21 ?8921 08/06/21 ?1941 08/06/21 ?0809 08/06/21 ?1045 08/06/21 ?1100 08/06/21 ?1113 08/06/21 ?1144 08/06/21 ?1203  ?WBC 8.9 8.3 9.1 8.7 9.3  --   --   --   --   --   --   ?HGB 18.5* 17.4* 16.9 17.6* 17.9*   < > 12.9* 13.2 12.9* 13.3 13.6  ?HCT 54.6* 50.3 48.3 51.2 52.0   < > 38.0* 38.7* 38.0* 39.0 40.0  ?MCV 93.2 93.1 92.9 93.4 92.7  --   --   --   --   --   --   ?PLT  236 198 204 195 201  --   --  176  --   --   --   ? < > = values in this interval not displayed.  ? ? ?Basic Metabolic Panel: ?Recent Labs  ?Lab 08/01/21 ?1028 08/02/21 ?0804 08/04/21 ?0011 08/06/21 ?7408 08/06/21 ?0809 08/06/21 ?1448 08/06/21 ?1856 08/06/21 ?1012 08/06/21 ?1042 08/06/21 ?1045 08/06/21 ?1113 08/06/21 ?1144 08/06/21 ?1203  ?NA 138 137 138 138 139   < > 138   < > 135 135 134* 135 136  ?K 4.8 4.6 4.0 4.3 4.3   < > 4.6   < > 5.9* 5.9* 5.9* 5.3* 4.6  ?CL 104 105 108 108 102  --  104  --  103  --   --  104  --   ?CO2 '28 25 24 24  '$ --   --   --   --   --   --   --   --   --   ?GLUCOSE 99 112* 101* 108* 105*  --  124*  --  134*  --   --  157*  --   ?BUN '10 11 12 14 14  '$ --  14  --  14  --   --  14  --   ?CREATININE 1.12 1.04 0.98 0.99 0.80  --  0.80  --  0.80  --   --  0.80  --   ?CALCIUM 9.6 9.3 8.8* 9.0  --   --   --   --   --   --   --   --   --   ? < > = values in this interval not displayed.  ? ?GFR: ?Estimated Creatinine Clearance: 106.2 mL/min (by C-G formula based on SCr of 0.8 mg/dL). ?Recent Labs  ?Lab 08/03/21 ?0252 08/04/21 ?0011 08/05/21 ?3149 08/06/21 ?7026  ?WBC 8.3 9.1 8.7 9.3  ? ? ?Liver Function Tests: ?Recent Labs  ?Lab 08/06/21 ?3785  ?AST 35  ?ALT 44  ?ALKPHOS 72  ?BILITOT 0.7  ?PROT 6.3*  ?ALBUMIN 3.7  ? ?No results for input(s): LIPASE, AMYLASE in the last 168 hours. ?No results for input(s): AMMONIA in the last 168 hours. ? ?ABG ?   ?Component Value Date/Time  ? PHART 7.327 (L) 08/06/2021 1203  ? PCO2ART 46.2 08/06/2021 1203  ? PO2ART 265 (H) 08/06/2021 1203  ? HCO3 24.2 08/06/2021 1203  ? TCO2 26 08/06/2021 1203  ? ACIDBASEDEF 2.0 08/06/2021 1203  ? O2SAT 100 08/06/2021 1203  ?  ? ?Coagulation Profile: ?Recent Labs  ?Lab 08/06/21 ?8850  ?INR 1.2  ? ? ?Cardiac Enzymes: ?No results for input(s): CKTOTAL, CKMB, CKMBINDEX, TROPONINI in the last 168 hours. ? ?HbA1C: ?Hgb A1c MFr Bld  ?Date/Time Value Ref Range Status  ?08/02/2021 08:04 AM 5.6 4.8 - 5.6 % Final  ?  Comment:  ?  (NOTE) ?Pre  diabetes:  5.7%-6.4% ? ?Diabetes:              >6.4% ? ?Glycemic control for   <7.0% ?adults with diabetes ?  ? ? ?CBG: ?No results for input(s): GLUCAP in the last 168 hours. ? ?Review of Systems:   ?Unable to be obtained due to intubated/ sedated status. ? ?Past Medical History:  ?He,  has a past medical history of Basal cell carcinoma (03/23/2018) and Basal cell carcinoma (11/23/2019).  ? ?Surgical History:  ? ?Past Surgical History:  ?Procedure Laterality Date  ? LEFT HEART CATH AND CORONARY ANGIOGRAPHY N/A 08/01/2021  ? Procedure: LEFT HEART CATH AND CORONARY ANGIOGRAPHY;  Surgeon: Jettie Booze, MD;  Location: [facility];  Service: Cardiovascular;  Laterality: N/A;  ?  ? ?Social History:  ? reports that he has quit smoking. He has never used smokeless tobacco. He reports current alcohol use of about 10.0 standard drinks per week. He reports that he does not use drugs.  ? ?Family History:  ?His family history is not on file.  ? ?Allergies ?No Known Allergies  ? ?Home Medications  ?Prior to Admission medications   ?Medication Sig Start Date End Date Taking? Authorizing Provider  ?acetaminophen (TYLENOL) 500 MG tablet Take 1,000 mg by mouth every 6 (six) hours as needed for moderate pain.   Yes [provider]  ?Ascorbic Acid (VITAMIN C) 1000 MG tablet Take 1,000 mg by mouth daily.   Yes [provider]  ?aspirin EC 81 MG tablet Take 324 mg by mouth daily as needed (chest discomfort). Swallow whole.   Yes [provider]  ?cholecalciferol (VITAMIN D3) 25 MCG (1000 UNIT) tablet Take 1,000 Units by mouth daily.   Yes [provider]  ?ibuprofen (ADVIL) 200 MG tablet Take 400 mg by mouth every 6 (six) hours as needed for moderate pain.   Yes [provider]  ?Multiple Vitamins-Minerals (LIVER DETOX PO) Take 1 tablet by mouth daily.   Yes [provider]  ?naproxen sodium (ALEVE) 220 MG tablet Take 220 mg by mouth daily as needed (pain).    Yes [provider]  ?testosterone cypionate (DEPOTESTOSTERONE CYPIONATE) 200 MG/ML injection SMARTSIG:Milliliter(s) IM ?Patient not taking: Reported on 08/01/2021 04/30/21   Provider, Historical

## 2021-08-06 NOTE — Anesthesia Preprocedure Evaluation (Signed)
Anesthesia Evaluation  ?Patient identified by MRN, date of birth, ID band ?Patient awake ? ? ? ?Reviewed: ?Allergy & Precautions, NPO status , Patient's Chart, lab work & pertinent test results ? ?Airway ?Mallampati: II ? ?TM Distance: >3 FB ?Neck ROM: Full ? ? ? Dental ?  ?Pulmonary ?former smoker,  ?  ?breath sounds clear to auscultation ? ? ? ? ? ? Cardiovascular ?+ CAD and + Past MI  ? ?Rhythm:Regular Rate:Normal ? ? ?  ?Neuro/Psych ?negative neurological ROS ?   ? GI/Hepatic ?negative GI ROS, Neg liver ROS,   ?Endo/Other  ?negative endocrine ROS ? Renal/GU ?negative Renal ROS  ? ?  ?Musculoskeletal ? ? Abdominal ?  ?Peds ? Hematology ?negative hematology ROS ?(+)   ?Anesthesia Other Findings ? ? Reproductive/Obstetrics ? ?  ? ? ? ? ? ? ? ? ? ? ? ? ? ?  ?  ? ? ? ? ? ? ? ? ?Anesthesia Physical ?Anesthesia Plan ? ?ASA: 4 ? ?Anesthesia Plan: General  ? ?Post-op Pain Management:   ? ?Induction: Intravenous ? ?PONV Risk Score and Plan: 2 and Dexamethasone, Ondansetron and Treatment may vary due to age or medical condition ? ?Airway Management Planned: Oral ETT ? ?Additional Equipment: Arterial line, CVP, TEE and Ultrasound Guidance Line Placement ? ?Intra-op Plan:  ? ?Post-operative Plan: Post-operative intubation/ventilation ? ?Informed Consent: I have reviewed the patients History and Physical, chart, labs and discussed the procedure including the risks, benefits and alternatives for the proposed anesthesia with the patient or authorized representative who has indicated his/her understanding and acceptance.  ? ? ? ?Dental advisory given ? ?Plan Discussed with: CRNA ? ?Anesthesia Plan Comments:   ? ? ? ? ? ? ?Anesthesia Quick Evaluation ? ?

## 2021-08-06 NOTE — Plan of Care (Signed)
  Problem: Education: Goal: Knowledge of General Education information will improve Description: Including pain rating scale, medication(s)/side effects and non-pharmacologic comfort measures Outcome: Progressing   Problem: Health Behavior/Discharge Planning: Goal: Ability to manage health-related needs will improve Outcome: Progressing   Problem: Clinical Measurements: Goal: Ability to maintain clinical measurements within normal limits will improve Outcome: Progressing Goal: Will remain free from infection Outcome: Progressing Goal: Diagnostic test results will improve Outcome: Progressing Goal: Respiratory complications will improve Outcome: Progressing Goal: Cardiovascular complication will be avoided Outcome: Progressing   Problem: Activity: Goal: Risk for activity intolerance will decrease Outcome: Progressing   Problem: Nutrition: Goal: Adequate nutrition will be maintained Outcome: Progressing   Problem: Coping: Goal: Level of anxiety will decrease Outcome: Progressing   Problem: Elimination: Goal: Will not experience complications related to bowel motility Outcome: Progressing Goal: Will not experience complications related to urinary retention Outcome: Progressing   Problem: Pain Managment: Goal: General experience of comfort will improve Outcome: Progressing   Problem: Safety: Goal: Ability to remain free from injury will improve Outcome: Progressing   Problem: Skin Integrity: Goal: Risk for impaired skin integrity will decrease Outcome: Progressing   Problem: Education: Goal: Understanding of CV disease, CV risk reduction, and recovery process will improve Outcome: Progressing Goal: Individualized Educational Video(s) Outcome: Progressing   Problem: Activity: Goal: Ability to return to baseline activity level will improve Outcome: Progressing   Problem: Cardiovascular: Goal: Ability to achieve and maintain adequate cardiovascular perfusion  will improve Outcome: Progressing Goal: Vascular access site(s) Level 0-1 will be maintained Outcome: Progressing   Problem: Health Behavior/Discharge Planning: Goal: Ability to safely manage health-related needs after discharge will improve Outcome: Progressing   Problem: Education: Goal: Will demonstrate proper wound care and an understanding of methods to prevent future damage Outcome: Progressing Goal: Knowledge of disease or condition will improve Outcome: Progressing Goal: Knowledge of the prescribed therapeutic regimen will improve Outcome: Progressing Goal: Individualized Educational Video(s) Outcome: Progressing   Problem: Activity: Goal: Risk for activity intolerance will decrease Outcome: Progressing   Problem: Cardiac: Goal: Will achieve and/or maintain hemodynamic stability Outcome: Progressing   Problem: Clinical Measurements: Goal: Postoperative complications will be avoided or minimized Outcome: Progressing   Problem: Respiratory: Goal: Respiratory status will improve Outcome: Progressing   Problem: Skin Integrity: Goal: Wound healing without signs and symptoms of infection Outcome: Progressing Goal: Risk for impaired skin integrity will decrease Outcome: Progressing   Problem: Urinary Elimination: Goal: Ability to achieve and maintain adequate renal perfusion and functioning will improve Outcome: Progressing   

## 2021-08-06 NOTE — Progress Notes (Signed)
?  Echocardiogram ?Echocardiogram Transesophageal has been performed. ? Hassie Bruce ?08/06/2021, 8:56 AM ?

## 2021-08-06 NOTE — Hospital Course (Addendum)
?  History of Present Illness:     ?61 year old male admitted with concern for an NSTEMI.  He was noted to have an elevated troponin on recent labs.  He does admit to recent history of exertional shortness of breath and some anginal symptoms.  Left heart cath shows severe three-vessel coronary artery disease.  The patient and all relevant studies were reviewed by Dr. Kipp Brood who recommended proceeding with coronary artery surgical revascularization.  ? ? ?Hospital course: ? ? The risks and benefits were reviewed with the patient who agreed to proceed and on 08/06/2021 he was taken the operating room at which time he underwent CABG x4.  He tolerated the procedure well and was taken to the surgical intensive care unit in stable condition.  The patient was extubated the evening of surgery.  He was transitioned off cardene drip and started on Amlodipine for his radial artery graft.  His pacing wires and chest tubes were removed without difficulty.  He was started on Plavix for ACS on presentation.  He was maintaining NSR and felt stable for transfer to the progressive care unit on 08/08/2021.  The patient continued to make good progress.  He is maintaining NSR.  He is ambulating independently.  His surgical incisions are healing without evidence of infection.  He is medically stable for discharge home today. ?

## 2021-08-06 NOTE — Brief Op Note (Signed)
08/01/2021 - 08/06/2021 ? ?2:22 PM ? ?PATIENT:  Antonio [name]  61 y.o. male ? ?PRE-OPERATIVE DIAGNOSIS:  Coronary Artery Disease ? ?POST-OPERATIVE DIAGNOSIS:  Coronary Artery Disease ? ?PROCEDURE:  Procedure(s): ?CORONARY ARTERY BYPASS GRAFTING X4, With Left Radial Artery Harvest, Right Greater Saphenous Vein Harvested Endoscopically (N/A) ?RADIAL ARTERY HARVEST (Left) ?TRANSESOPHAGEAL ECHOCARDIOGRAM (TEE) (N/A) ?Vein harvest time: 29mn Vein prep time: 437m ?Radial harvest time 60 min    prep time 10 minutes ? ?SURGEON:  Surgeon(s) and Role: ?   * LiLajuana MatteMD - Primary ? ?PHYSICIAN ASSISTANT: Juanita Devincent PA-C ? ?ASSISTANTS: STAFF  ? ?ANESTHESIA:   general ? ?EBL:  341 mL  ? ?BLOOD ADMINISTERED:none ? ?DRAINS:  LEFT PLEURAL AND MEDIASTINAL CHEST DRAINS   ? ?LOCAL MEDICATIONS USED:  NONE ? ?SPECIMEN:  No Specimen ? ?DISPOSITION OF SPECIMEN:  N/A ? ?COUNTS:  YES ? ?TOURNIQUET:  * No tourniquets in log * ? ?DICTATION: .Dragon Dictation ? ?PLAN OF CARE: Admit to inpatient  ? ?PATIENT DISPOSITION:  ICU - intubated and hemodynamically stable. ?  ?Delay start of Pharmacological VTE agent (>24hrs) due to surgical blood loss or risk of bleeding: yes ? ?COMPLICATIONS: NO KNOWN ? ? ? ?

## 2021-08-06 NOTE — Procedures (Signed)
Extubation Procedure Note ? ?Patient Details:   ?Name: Antonio [name] ?DOB: 1960-11-30 ?MRN: [id number] ?  ?Airway Documentation:  ?  ?Vent end date: 08/06/21 Vent end time: 4360  ? ?Evaluation ? O2 sats: stable throughout ?Complications: No apparent complications ?Patient did tolerate procedure well. ?Bilateral Breath Sounds: Clear, Diminished ?  ?Yes, pt could speak post extubation.  Pt extubated to 4 l/m nasal cannula per physician's order. ? ?Earney Navy ?08/06/2021, 5:28 PM ? ?

## 2021-08-06 NOTE — Anesthesia Procedure Notes (Signed)
Arterial Line Insertion ?Start/End3/28/2023 6:55 AM, 08/06/2021 7:00 AM ?Performed by: Suzette Battiest, MD, Vonna Drafts, CRNA, CRNA ? Patient location: Pre-op. ?Preanesthetic checklist: patient identified, IV checked, site marked, risks and benefits discussed, surgical consent, monitors and equipment checked, pre-op evaluation, timeout performed and anesthesia consent ?Lidocaine 1% used for infiltration ?Right, radial was placed ?Catheter size: 20 G ?Hand hygiene performed  and maximum sterile barriers used  ?Allen's test indicative of satisfactory collateral circulation ?Attempts: 1 ?Procedure performed without using ultrasound guided technique. ?Following insertion, dressing applied and Biopatch. ?Post procedure assessment: normal ? ?Patient tolerated the procedure well with no immediate complications. ? ? ?

## 2021-08-06 NOTE — Op Note (Signed)
? ?   ?[address] ?      [place] [address] ?            [phone]      ?                                 ?  ? ?08/06/2021 ?Patient:  Antonio [name] ?Pre-Op Dx: NSTEMI ?  Three-vessel coronary artery disease ?  Hypertension ?  Hyperlipidemia ?Post-op Dx: Same ?Procedure: ?CABG X 4.  LIMA to LAD, left radial artery to obtuse marginal, reverse saphenous vein graft to second diagonal and P AV. ?Endoscopic greater saphenous vein harvest on the right ?Open left radial artery harvest ? ? ?Surgeon and Role:   ?   * Lajuana Matte, MD - Primary ?   Evonnie Pat, PA-C- assisting ?An experienced assistant was required given the complexity of this surgery and the standard of surgical care. The assistant was needed for exposure, dissection, suctioning, retraction of delicate tissues and sutures, instrument exchange and for overall help during this procedure.   ? ?Anesthesia  general ?EBL: 500 ml ?Blood Administration: None ?Xclamp Time: 59 min ?Pump Time: 103 min ? ?Drains: 23 F blake drain: R, L, mediastinal  ?Wires: Ventricular ?Counts: correct ? ? ?Indications: ?61 yo male who presents with NSTEMI.  3V CAD, preserved biventricular function, and no significant valvular disease.  We discussed the risks and benefits of surgical revascularization, and he is agreeable to proceed.  Use left radial artery as one of the conduits. ?Findings: ?Good caliber LIMA.  Good caliber radial artery and reverse saphenous vein graft.  LAD was heavily calcified.  The diagonal was small caliber vessel.  The obtuse marginal was intramyocardial with calcifications.  The PV was a moderate size vessel. ? ?Operative Technique: ?All invasive lines were placed in pre-op holding.  After the risks, benefits and alternatives were thoroughly discussed, the patient was brought to the operative theatre.  Anesthesia was induced, and the patient was prepped and draped in normal sterile fashion.  An appropriate surgical pause was performed,  and pre-operative antibiotics were dosed accordingly. ? ?We began with simultaneous incisions along the right leg for harvesting of the greater saphenous vein, the left forearm for harvesting of the radial artery and the chest for the sternotomy.  In regards to the sternotomy, this was carried down with bovie cautery, and the sternum was divided with a reciprocating saw.  Meticulous hemostasis was obtained.  The left internal thoracic artery was exposed and harvested in in pedicled fashion.  The patient was systemically heparinized, and the artery was divided distally, and placed in a papaverine sponge.   ? ?The sternal elevator was removed, and a retractor was placed.  The pericardium was divided in the midline and fashioned into a cradle with pericardial stitches.   After we confirmed an appropriate ACT, the ascending aorta was cannulated in standard fashion.  The right atrial appendage was used for venous cannulation site.  Cardiopulmonary bypass was initiated, and the heart retractor was placed. The cross clamp was applied, and a dose of anterograde cardioplegia was given with good arrest of the heart.  We moved to the posterior wall of the heart, and found a good target on the PA view branch.  An arteriotomy was made, and the vein graft was anastomosed to it in an end to side fashion.  Next we exposed the lateral wall,  and found a good target on the obtuse marginal.  An end to side anastomosis with the radial artery graft was then created.  Next, we exposed the anterior wall of the heart and identified a good target on second diagonal.   An arteriotomy was created.  The vein was anastomosed in an end to side fashion.  Finally, we exposed a good target on the LAD, and fashioned an end to side anastomosis between it and the LITA.  We began to re-warm, and a re-animation dose of cardioplegia was given.  The heart was de-aired, and the cross clamp was removed.  Meticulous hemostasis was obtained.   ? ?A partial  occludding clamp was then placed on the ascending aorta, and we created an end to side anastomosis between it and the proximal vein grafts.  The radial artery was jumped off of the hood of the diagonal vein graft.  Rings were placed on the proximal anastomosis.  Hemostasis was obtained, and we separated from cardiopulmonary bypass without event.  The heparin was reversed with protamine.  Chest tubes and wires were placed, and the sternum was re-approximated with sternal wires.  The soft tissue and skin were re-approximated wth absorbable suture.   ? ?The patient tolerated the procedure without any immediate complications, and was transferred to the ICU in guarded condition. ? ?Lajuana Matte ? ?

## 2021-08-06 NOTE — Anesthesia Procedure Notes (Signed)
Central Venous Catheter Insertion ?Performed by: Suzette Battiest, MD, anesthesiologist ?Start/End3/28/2023 7:00 AM, 08/06/2021 7:15 AM ?Patient location: Pre-op. ?Preanesthetic checklist: patient identified, IV checked, site marked, risks and benefits discussed, surgical consent, monitors and equipment checked, pre-op evaluation, timeout performed and anesthesia consent ?Position: Trendelenburg ?Lidocaine 1% used for infiltration and patient sedated ?Hand hygiene performed , maximum sterile barriers used  and Seldinger technique used ?Catheter size: 8.5 Fr ?Total catheter length 10. ?Central line was placed.Sheath introducer ?Swan type:thermodilution ?Procedure performed using ultrasound guided technique. ?Ultrasound Notes:anatomy identified, needle tip was noted to be adjacent to the nerve/plexus identified, no ultrasound evidence of intravascular and/or intraneural injection and image(s) printed for medical record ?Attempts: 1 ?Following insertion, line sutured, dressing applied and Biopatch. ?Post procedure assessment: blood return through all ports, free fluid flow and no air ? ?Patient tolerated the procedure well with no immediate complications. ?Additional procedure comments: Triple lumen SLIC inserted through introducer port. All ports aspirated and flushed.. ? ? ? ? ? ?

## 2021-08-07 ENCOUNTER — Inpatient Hospital Stay (HOSPITAL_COMMUNITY): Payer: Self-pay

## 2021-08-07 ENCOUNTER — Encounter (HOSPITAL_COMMUNITY): Payer: Self-pay | Admitting: Thoracic Surgery (Cardiothoracic Vascular Surgery)

## 2021-08-07 DIAGNOSIS — R579 Shock, unspecified: Secondary | ICD-10-CM

## 2021-08-07 LAB — CBC
HCT: 40 % (ref 39.0–52.0)
HCT: 40.8 % (ref 39.0–52.0)
Hemoglobin: 13.5 g/dL (ref 13.0–17.0)
Hemoglobin: 13.7 g/dL (ref 13.0–17.0)
MCH: 31.8 pg (ref 26.0–34.0)
MCH: 32.2 pg (ref 26.0–34.0)
MCHC: 33.8 g/dL (ref 30.0–36.0)
MCHC: 33.6 g/dL (ref 30.0–36.0)
MCV: 94.1 fL (ref 80.0–100.0)
MCV: 96 fL (ref 80.0–100.0)
Platelets: 168 10*3/uL (ref 150–400)
Platelets: 146 10*3/uL — ABNORMAL LOW (ref 150–400)
RBC: 4.25 MIL/uL (ref 4.22–5.81)
RBC: 4.25 MIL/uL (ref 4.22–5.81)
RDW: 13 % (ref 11.5–15.5)
RDW: 13.2 % (ref 11.5–15.5)
WBC: 17.8 10*3/uL — ABNORMAL HIGH (ref 4.0–10.5)
WBC: 15.9 10*3/uL — ABNORMAL HIGH (ref 4.0–10.5)
nRBC: 0 % (ref 0.0–0.2)
nRBC: 0 % (ref 0.0–0.2)

## 2021-08-07 LAB — BASIC METABOLIC PANEL
Anion gap: 5 (ref 5–15)
Anion gap: 6 (ref 5–15)
BUN: 15 mg/dL (ref 8–23)
BUN: 18 mg/dL (ref 8–23)
CO2: 23 mmol/L (ref 22–32)
CO2: 27 mmol/L (ref 22–32)
Calcium: 8 mg/dL — ABNORMAL LOW (ref 8.9–10.3)
Calcium: 8.1 mg/dL — ABNORMAL LOW (ref 8.9–10.3)
Chloride: 107 mmol/L (ref 98–111)
Chloride: 102 mmol/L (ref 98–111)
Creatinine, Ser: 1.17 mg/dL (ref 0.61–1.24)
Creatinine, Ser: 1.15 mg/dL (ref 0.61–1.24)
GFR, Estimated: >60 mL/min (ref >60)
GFR, Estimated: >60 mL/min (ref >60)
Glucose, Bld: 119 mg/dL — ABNORMAL HIGH (ref 70–99)
Glucose, Bld: 133 mg/dL — ABNORMAL HIGH (ref 70–99)
Potassium: 4.4 mmol/L (ref 3.5–5.1)
Potassium: 4.2 mmol/L (ref 3.5–5.1)
Sodium: 135 mmol/L (ref 135–145)
Sodium: 135 mmol/L (ref 135–145)

## 2021-08-07 LAB — MAGNESIUM
Magnesium: 2.6 mg/dL — ABNORMAL HIGH (ref 1.7–2.4)
Magnesium: 2.3 mg/dL (ref 1.7–2.4)

## 2021-08-07 LAB — GLUCOSE, CAPILLARY
Glucose-Capillary: 101 mg/dL — ABNORMAL HIGH (ref 70–99)
Glucose-Capillary: 102 mg/dL — ABNORMAL HIGH (ref 70–99)
Glucose-Capillary: 106 mg/dL — ABNORMAL HIGH (ref 70–99)
Glucose-Capillary: 110 mg/dL — ABNORMAL HIGH (ref 70–99)
Glucose-Capillary: 111 mg/dL — ABNORMAL HIGH (ref 70–99)
Glucose-Capillary: 114 mg/dL — ABNORMAL HIGH (ref 70–99)
Glucose-Capillary: 119 mg/dL — ABNORMAL HIGH (ref 70–99)
Glucose-Capillary: 122 mg/dL — ABNORMAL HIGH (ref 70–99)
Glucose-Capillary: 123 mg/dL — ABNORMAL HIGH (ref 70–99)

## 2021-08-07 LAB — TYPE AND SCREEN
ABO/RH(D): O POS
Antibody Screen: NEGATIVE
Unit division: 0
Unit division: 0

## 2021-08-07 LAB — BPAM RBC
Blood Product Expiration Date: 202304252359
Blood Product Expiration Date: 202304252359
ISSUE DATE / TIME: 202303270742
Unit Type and Rh: 5100
Unit Type and Rh: 5100

## 2021-08-07 MED ORDER — INSULIN ASPART 100 UNIT/ML IJ SOLN
0.0000 [IU] | INTRAMUSCULAR | Status: DC
  Administered 2021-08-07: 2 [IU] via SUBCUTANEOUS

## 2021-08-07 MED ORDER — CHLORHEXIDINE GLUCONATE CLOTH 2 % EX PADS
6.0000 | MEDICATED_PAD | Freq: Every day | CUTANEOUS | Status: DC
  Administered 2021-08-07 – 2021-08-10 (×3): 6 via TOPICAL

## 2021-08-07 MED ORDER — ASPIRIN EC 81 MG PO TBEC
81.0000 mg | DELAYED_RELEASE_TABLET | Freq: Every day | ORAL | Status: DC
  Administered 2021-08-07 – 2021-08-10 (×4): 81 mg via ORAL
  Filled 2021-08-07 (×4): qty 1

## 2021-08-07 MED ORDER — FUROSEMIDE 10 MG/ML IJ SOLN
40.0000 mg | Freq: Once | INTRAMUSCULAR | Status: AC
  Administered 2021-08-07: 40 mg via INTRAVENOUS
  Filled 2021-08-07: qty 4

## 2021-08-07 MED ORDER — CLOPIDOGREL BISULFATE 75 MG PO TABS
75.0000 mg | ORAL_TABLET | Freq: Every day | ORAL | Status: DC
  Administered 2021-08-07 – 2021-08-10 (×4): 75 mg via ORAL
  Filled 2021-08-07 (×4): qty 1

## 2021-08-07 MED ORDER — INSULIN ASPART 100 UNIT/ML IJ SOLN: 0.0000 [IU] | INTRAMUSCULAR | Status: DC

## 2021-08-07 MED ORDER — AMLODIPINE BESYLATE 5 MG PO TABS
2.5000 mg | ORAL_TABLET | Freq: Every day | ORAL | Status: DC
  Administered 2021-08-07 – 2021-08-10 (×4): 2.5 mg via ORAL
  Filled 2021-08-07 (×4): qty 1

## 2021-08-07 MED FILL — Electrolyte-R (PH 7.4) Solution: INTRAVENOUS | Qty: 3000 | Status: AC

## 2021-08-07 MED FILL — Sodium Bicarbonate IV Soln 8.4%: INTRAVENOUS | Qty: 50 | Status: AC

## 2021-08-07 MED FILL — Heparin Sodium (Porcine) Inj 1000 Unit/ML: INTRAMUSCULAR | Qty: 10 | Status: AC

## 2021-08-07 MED FILL — Sodium Chloride IV Soln 0.9%: INTRAVENOUS | Qty: 2000 | Status: AC

## 2021-08-07 MED FILL — Mannitol IV Soln 20%: INTRAVENOUS | Qty: 500 | Status: AC

## 2021-08-07 MED FILL — Calcium Chloride Inj 10%: INTRAVENOUS | Qty: 10 | Status: AC

## 2021-08-07 NOTE — Progress Notes (Signed)
? ?   ?[address] ?      [place] [address] ?            [phone]   ?              ?1 Day Post-Op ?Procedure(s) (LRB): ?CORONARY ARTERY BYPASS GRAFTING X4, With Left Radial Artery Harvest, Right Greater Saphenous Vein Harvested Endoscopically (N/A) ?RADIAL ARTERY HARVEST (Left) ?TRANSESOPHAGEAL ECHOCARDIOGRAM (TEE) (N/A) ? ? ?Events: ?No events ?_______________________________________________________________ ?Vitals: ?BP 96/62   Pulse 83   Temp 99.1 ?F (37.3 ?C)   Resp (!) 25   Ht '5\' 8"'$  (1.727 m)   Wt 99.4 kg   SpO2 96%   BMI 33.32 kg/m?  ?Filed Weights  ? 08/01/21 1017 08/07/21 0500  ?Weight: 93.4 kg 99.4 kg  ? ? ? ?- Neuro: alert NAD ? ?- Cardiovascular: sinus ? Drips: neo, and nitro.   ?CVP:  [2 mmHg-19 mmHg] 13 mmHg ?CO:  [3.8 L/min-6.6 L/min] 5.9 L/min ?CI:  [1.8 L/min/m2-3.2 L/min/m2] 2.9 L/min/m2 ? ?- Pulm: EWOB ? ? ?ABG ?   ?Component Value Date/Time  ? PHART 7.347 (L) 08/06/2021 2029  ? PCO2ART 42.2 08/06/2021 2029  ? PO2ART 80 (L) 08/06/2021 2029  ? HCO3 23.0 08/06/2021 2029  ? TCO2 24 08/06/2021 2029  ? ACIDBASEDEF 2.0 08/06/2021 2029  ? O2SAT 95 08/06/2021 2029  ? ? ?- Abd: ND ?- Extremity: trace edema ? ?Marland KitchenIntake/Output   ?   03/28 0701 ?03/29 0700 03/29 0701 ?03/30 0700  ? P.O. 300   ? I.V. (mL/kg) 3303.7 (33.2)   ? Blood 290   ? IV Piggyback 1136.7   ? Total Intake(mL/kg) 5030.4 (50.6)   ? Urine (mL/kg/hr) 2445 (1)   ? Blood 341   ? Chest Tube 390   ? Total Output 3176   ? Net +1854.4   ?     ?  ? ? ?_______________________________________________________________ ?Labs: ? ?  Latest Ref Rng & Units 08/07/2021  ?  3:04 AM 08/06/2021  ?  8:29 PM 08/06/2021  ?  7:00 PM  ?CBC  ?WBC 4.0 - 10.5 K/uL 17.8    16.3    ?Hemoglobin 13.0 - 17.0 g/dL 13.5   13.9   14.3    ?Hematocrit 39.0 - 52.0 % 40.0   41.0   41.7    ?Platelets 150 - 400 K/uL 168    163    ? ? ?  Latest Ref Rng & Units 08/07/2021  ?  3:04 AM 08/06/2021  ?  8:29 PM 08/06/2021  ?  7:00 PM  ?CMP  ?Glucose 70 - 99 mg/dL 119     138    ?BUN 8 - 23 mg/dL 15    13    ?Creatinine 0.61 - 1.24 mg/dL 1.17    0.96    ?Sodium 135 - 145 mmol/L 135   139   138    ?Potassium 3.5 - 5.1 mmol/L 4.4   4.4   4.4    ?Chloride 98 - 111 mmol/L 107    108    ?CO2 22 - 32 mmol/L 23    23    ?Calcium 8.9 - 10.3 mg/dL 8.0    7.6    ? ? ?CXR: ?clear ? ?_______________________________________________________________ ? ?Assessment and Plan: ?POD 1 s/p CABG ? ?Neuro: pain controlled ?CV: will remove wires and a line.  Starting amlodipine.  Will wean neo and nitro.  Will start plavix once wires out, low dose aspirin.  On  S/BB ?Pulm: pulm hyg ?Renal: creat stable ?GI: advancing diet ?Heme: stable ?ID: afebrile ?Endo: SSI ?Dispo: continue ICU care ? ? ?Antonio [name] ?08/07/2021 7:45 AM ? ? ?

## 2021-08-07 NOTE — Progress Notes (Signed)
? ?NAME:  Antonio [name], MRN:  [id number], DOB:  1960/09/07, LOS: 5 ?ADMISSION DATE:  08/01/2021, CONSULTATION DATE:  08/06/21 ?REFERRING MD:  Antonio [name], CHIEF COMPLAINT:  post-CABG  ? ?History of Present Illness:  ?Antonio [name] is a 61 y/o gentleman who presented on 3/23 with DOE and an episode of chest tightness that was relived with rest. He had outpatient troponin levels drawn that were elevated and he was referred to the ED. He has a history of CAD in multiple first degree relatives. He has a history of HLD and HTN managed with lifestyle interventions. Non-smoker. He was admitted to Cardiology and underwent LHC demonstrating 3 vessel CAD. He underwent 4-vessel CABG on 08/06/21 and was transferred to the ICU following his surgery. PCCM consulted for post-op ICU care. ? ?Pertinent  Medical History  ?CAD ?HTN ?HLD ?Basal cell carcinoma ? ?Significant Hospital Events: ?Including procedures, antibiotic start and stop dates in addition to other pertinent events   ?3/23 admitted for NSTEMI, LHC> 3 vessel disease ?3/28 4-vessel CABG ? ?Interim History / Subjective:  ?Overnight no acute events, this morning he feels well.  Mild sternal pain, but no nausea. Tmax 100.2. ? ?Objective   ?Blood pressure 99/63, pulse 84, temperature 99.1 ?F (37.3 ?C), resp. rate 17, height '5\' 8"'$  (1.727 m), weight 99.4 kg, SpO2 96 %. ?CVP:  [2 mmHg-19 mmHg] 16 mmHg ?CO:  [3.8 L/min-6.6 L/min] 5.9 L/min ?CI:  [1.8 L/min/m2-3.2 L/min/m2] 2.9 L/min/m2  ?Vent Mode: PSV;CPAP ?FiO2 (%):  [36 %-50 %] 36 % ?Set Rate:  [4 bmp-16 bmp] 4 bmp ?Vt Set:  [540 mL] 540 mL ?Pressure Support:  [10 cmH20] 10 cmH20 ?Plateau Pressure:  [16 cmH20] 16 cmH20  ? ?Intake/Output Summary (Last 24 hours) at 08/07/2021 0713 ?Last data filed at 08/07/2021 0500 ?Gross per 24 hour  ?Intake 4867.29 ml  ?Output 3106 ml  ?Net 1761.29 ml  ? ? ?Filed Weights  ? 08/01/21 1017 08/07/21 0500  ?Weight: 93.4 kg 99.4 kg  ? ? ?Examination: ?General: Middle-age man sitting up in the chair no acute  distress, appears younger than stated age ?HENT: [place]/AT, eyes anicteric ?Lungs: Breathing comfortably on [place] ?Cardiovascular: S1-S2, regular rate and rhythm.  Not requiring pacing.  Minimal bloody output from chest tubes. ?Abdomen: Soft, nontender ?Extremities: Left arm radial site with minimal blood on the dressing.  Right radial A-line with normal distal perfusion.  Minimal peripheral edema. ?Neuro: Awake and alert, answering questions appropriately, moving all extremities ?GU: Clear yellow urine ? ?BUN 15 ?Cr 1.17 ?WBC 17.8 ?CXR personally reviewed> mild pulmonary edema, RIJ CVC, chest tubes ? ?Resolved Hospital Problem list   ? ? ?Assessment & Plan:  ? ?CAD with NSTEMI, s/p CABG x 4 on 3/23 (L radial and SVG grafts) ?-Postop care per TCTS.  Remove pacing wires, arterial line, Foley. ?- Complete postop antibiotics ?- Pain control with tramadol, oxycodone, morphine. ?- Start Plavix, continue aspirin. ?- Daily atorvastatin ?- Wean off post-op pressors. Metoprolol BID. ?- Start amlodipine today for radial graft.  Transition off nitroglycerin infusion. ? ?Hyperglycemia, prediabetes ?-Transition to basal bolus insulin today ?- Goal BG less than 180 ? ?Obesity, BMI 33 ?-Recommend working towards maintaining a healthy weight long-term ? ?Lung nodule ?-Recommend outpatient follow-up imaging in about 1 month.  Portable chest x-rays would not be reliable for re-demonstrating lower lobe pathology. ? ?Best Practice (right click and "Reselect all SmartList Selections" daily)  ? ?Diet/type: Regular consistency (see orders) ?DVT prophylaxis: not indicated ?GI prophylaxis: PPI ?Lines: Central line and  yes and it is still needed ?Foley:  removal ordered  ?Code Status:  full code ?Last date of multidisciplinary goals of care discussion '[ ]'$  ? ?Labs   ?CBC: ?Recent Labs  ?Lab 08/05/21 ?0223 08/06/21 ?0629 08/06/21 ?0809 08/06/21 ?1100 08/06/21 ?1113 08/06/21 ?1320 08/06/21 ?1707 08/06/21 ?1817 08/06/21 ?1900 08/06/21 ?2029  08/07/21 ?0304  ?WBC 8.7 9.3  --   --   --  19.3*  --   --  16.3*  --  17.8*  ?HGB 17.6* 17.9*   < > 13.2   < > 15.3  15.4 13.3 13.3 14.3 13.9 13.5  ?HCT 51.2 52.0   < > 38.7*   < > 45.0  45.6 39.0 39.0 41.7 41.0 40.0  ?MCV 93.4 92.7  --   --   --  95.2  --   --  93.7  --  94.1  ?PLT 195 201  --  176  --  155  --   --  163  --  168  ? < > = values in this interval not displayed.  ? ? ? ?Basic Metabolic Panel: ?Recent Labs  ?Lab 08/02/21 ?0804 08/04/21 ?0011 08/06/21 ?7408 08/06/21 ?0809 08/06/21 ?1448 08/06/21 ?1012 08/06/21 ?1042 08/06/21 ?1045 08/06/21 ?1144 08/06/21 ?1203 08/06/21 ?1707 08/06/21 ?1817 08/06/21 ?1900 08/06/21 ?2029 08/07/21 ?0304  ?NA 137 138 138   < > 138   < > 135   < > 135   < > 141 141 138 139 135  ?K 4.6 4.0 4.3   < > 4.6   < > 5.9*   < > 5.3*   < > 4.2 4.3 4.4 4.4 4.4  ?CL 105 108 108   < > 104  --  103  --  104  --   --   --  108  --  107  ?CO2 '25 24 24  '$ --   --   --   --   --   --   --   --   --  23  --  23  ?GLUCOSE 112* 101* 108*   < > 124*  --  134*  --  157*  --   --   --  138*  --  119*  ?BUN '11 12 14   '$ < > 14  --  14  --  14  --   --   --  13  --  15  ?CREATININE 1.04 0.98 0.99   < > 0.80  --  0.80  --  0.80  --   --   --  0.96  --  1.17  ?CALCIUM 9.3 8.8* 9.0  --   --   --   --   --   --   --   --   --  7.6*  --  8.0*  ?MG  --   --   --   --   --   --   --   --   --   --   --   --  2.8*  --  2.6*  ? < > = values in this interval not displayed.  ? ? ?GFR: ?Estimated Creatinine Clearance: 74.8 mL/min (by C-G formula based on SCr of 1.17 mg/dL). ?Recent Labs  ?Lab 08/06/21 ?0629 08/06/21 ?1320 08/06/21 ?1900 08/07/21 ?0304  ?WBC 9.3 19.3* 16.3* 17.8*  ? ? ? ?Liver Function Tests: ?Recent Labs  ?Lab 08/06/21 ?1856  ?AST 35  ?ALT 44  ?ALKPHOS 72  ?BILITOT 0.7  ?  PROT 6.3*  ?ALBUMIN 3.7  ? ? ?No results for input(s): LIPASE, AMYLASE in the last 168 hours. ?No results for input(s): AMMONIA in the last 168 hours. ? ?ABG ?   ?Component Value Date/Time  ? PHART 7.347 (L) 08/06/2021 2029  ?  PCO2ART 42.2 08/06/2021 2029  ? PO2ART 80 (L) 08/06/2021 2029  ? HCO3 23.0 08/06/2021 2029  ? TCO2 24 08/06/2021 2029  ? ACIDBASEDEF 2.0 08/06/2021 2029  ? O2SAT 95 08/06/2021 2029  ? ?  ? ?Coagulation Profile: ?Recent Labs  ?Lab 08/06/21 ?0629 08/06/21 ?1320  ?INR 1.2 1.5*  ? ? ? ?Cardiac Enzymes: ?No results for input(s): CKTOTAL, CKMB, CKMBINDEX, TROPONINI in the last 168 hours. ? ? ?This patient is critically ill with multiple organ system failure which requires frequent high complexity decision making, assessment, support, evaluation, and titration of therapies. This was completed through the application of advanced monitoring technologies and extensive interpretation of multiple databases. During this encounter critical care time was devoted to patient care services described in this note for 34 minutes. ? ?Julian Hy, DO 08/07/21 8:07 AM ?[facility] ? ?

## 2021-08-07 NOTE — Progress Notes (Signed)
? ?   ?  [address] ?      [place] [address] ?            [phone]   ? ?  ?POD # 1 CABG x 4 ? ?BP 127/75   Pulse 85   Temp 99.3 ?F (37.4 ?C) (Oral)   Resp (!) 24   Ht '5\' 8"'$  (1.727 m)   Wt 99.4 kg   SpO2 97%   BMI 33.32 kg/m?  ?2L [place] 95% sat ? ?Intake/Output Summary (Last 24 hours) at 08/07/2021 1804 ?Last data filed at 08/07/2021 1700 ?Gross per 24 hour  ?Intake 1615.3 ml  ?Output 2545 ml  ?Net -929.7 ml  ?K= 4.2, Creatinine 1.15 ?Hct 41 ? ?Doing well POD # 1 ? ?Revonda Standard Roxan Hockey, MD ?[facility] ?[phone] ? ?

## 2021-08-08 ENCOUNTER — Inpatient Hospital Stay (HOSPITAL_COMMUNITY): Payer: Self-pay

## 2021-08-08 DIAGNOSIS — J9601 Acute respiratory failure with hypoxia: Secondary | ICD-10-CM

## 2021-08-08 LAB — BASIC METABOLIC PANEL
Anion gap: 5 (ref 5–15)
BUN: 21 mg/dL (ref 8–23)
CO2: 27 mmol/L (ref 22–32)
Calcium: 8.1 mg/dL — ABNORMAL LOW (ref 8.9–10.3)
Chloride: 100 mmol/L (ref 98–111)
Creatinine, Ser: 1.14 mg/dL (ref 0.61–1.24)
GFR, Estimated: >60 mL/min (ref >60)
Glucose, Bld: 112 mg/dL — ABNORMAL HIGH (ref 70–99)
Potassium: 4.1 mmol/L (ref 3.5–5.1)
Sodium: 132 mmol/L — ABNORMAL LOW (ref 135–145)

## 2021-08-08 LAB — CBC
HCT: 38.5 % — ABNORMAL LOW (ref 39.0–52.0)
Hemoglobin: 12.6 g/dL — ABNORMAL LOW (ref 13.0–17.0)
MCH: 31.7 pg (ref 26.0–34.0)
MCHC: 32.7 g/dL (ref 30.0–36.0)
MCV: 96.7 fL (ref 80.0–100.0)
Platelets: 140 10*3/uL — ABNORMAL LOW (ref 150–400)
RBC: 3.98 MIL/uL — ABNORMAL LOW (ref 4.22–5.81)
RDW: 13.2 % (ref 11.5–15.5)
WBC: 15.1 10*3/uL — ABNORMAL HIGH (ref 4.0–10.5)
nRBC: 0 % (ref 0.0–0.2)

## 2021-08-08 LAB — ECHO INTRAOPERATIVE TEE
Height: 68 in
Weight: 3296 oz

## 2021-08-08 LAB — GLUCOSE, CAPILLARY
Glucose-Capillary: 124 mg/dL — ABNORMAL HIGH (ref 70–99)
Glucose-Capillary: 116 mg/dL — ABNORMAL HIGH (ref 70–99)

## 2021-08-08 MED ORDER — SODIUM CHLORIDE 0.9% FLUSH: 3.0000 mL | INTRAVENOUS | Status: DC | PRN

## 2021-08-08 MED ORDER — SODIUM CHLORIDE 0.9 % IV SOLN: 250.0000 mL | INTRAVENOUS | Status: DC | PRN

## 2021-08-08 MED ORDER — SODIUM CHLORIDE 0.9% FLUSH
3.0000 mL | Freq: Two times a day (BID) | INTRAVENOUS | Status: DC
  Administered 2021-08-08 – 2021-08-10 (×4): 3 mL via INTRAVENOUS

## 2021-08-08 MED ORDER — FUROSEMIDE 40 MG PO TABS
40.0000 mg | ORAL_TABLET | Freq: Every day | ORAL | Status: DC
  Administered 2021-08-08 – 2021-08-10 (×3): 40 mg via ORAL
  Filled 2021-08-08 (×3): qty 1

## 2021-08-08 MED ORDER — ENOXAPARIN SODIUM 40 MG/0.4ML IJ SOSY
40.0000 mg | PREFILLED_SYRINGE | Freq: Every day | INTRAMUSCULAR | Status: DC
  Administered 2021-08-08 – 2021-08-09 (×2): 40 mg via SUBCUTANEOUS
  Filled 2021-08-08 (×3): qty 0.4

## 2021-08-08 MED ORDER — POTASSIUM CHLORIDE CRYS ER 20 MEQ PO TBCR
40.0000 meq | EXTENDED_RELEASE_TABLET | Freq: Every day | ORAL | Status: DC
  Administered 2021-08-08 – 2021-08-10 (×3): 40 meq via ORAL
  Filled 2021-08-08 (×3): qty 2

## 2021-08-08 MED ORDER — ~~LOC~~ CARDIAC SURGERY, PATIENT & FAMILY EDUCATION: Freq: Once | Status: AC

## 2021-08-08 NOTE — Discharge Instructions (Signed)

## 2021-08-08 NOTE — Discharge Summary (Signed)
? ?   ?[address] ?      [place] [address] ?            [phone]   ? ?Physician Discharge Summary  ?Patient ID: ?Antonio [name] ?MRN: [id number] ?DOB/AGE: [birth year] 61 y.o. ? ?Admit date: 08/01/2021 ?Discharge date: 08/10/2021 ? ?Admission Diagnoses: ? ?Patient Active Problem List  ? Diagnosis Date Noted  ? Non-ST elevation (NSTEMI) myocardial infarction [facility])   ? Nodule of lower lobe of left lung   ? Hyperlipidemia   ? Acute coronary syndrome ([place]) 08/01/2021  ? Basal cell carcinoma 03/23/2018  ? ? ? ?Discharge Diagnoses:  ?Patient Active Problem List  ? Diagnosis Date Noted  ? S/P CABG x 4 08/06/2021  ? Non-ST elevation (NSTEMI) myocardial infarction [facility])   ? Nodule of lower lobe of left lung   ? Hyperlipidemia   ? Acute coronary syndrome ([place]) 08/01/2021  ? Basal cell carcinoma 03/23/2018  ? ? ?Discharged Condition: discharged to home in stable condition. ? ?  ?History of Present Illness:     ?61 year old male admitted with concern for an NSTEMI.  He was noted to have an elevated troponin on recent labs.  He does admit to recent history of exertional shortness of breath and some anginal symptoms.  Left heart cath shows severe three-vessel coronary artery disease.  The patient and all relevant studies were reviewed by Dr. Kipp Brood who recommended proceeding with coronary artery surgical revascularization.  ? ? ?Hospital Course: ? ? The risks and benefits were reviewed with the patient who agreed to proceed and on 08/06/2021 he was taken the operating room at which time he underwent CABG x4.  He tolerated the procedure well and was taken to the surgical intensive care unit in stable condition.  The patient was extubated the evening of surgery.  He was transitioned off cardene drip and started on Amlodipine for his radial artery graft.  His pacing wires and chest tubes were removed without difficulty.  He was started on Plavix for ACS on presentation.  He was maintaining NSR and felt stable for  transfer to the progressive care unit on 08/08/2021.  Diet and activity were advanced and well-tolerated.  By postop day 3, he was completely independent with his mobility and transfers.  He continued to maintain stable sinus rhythm.  His incisions were healing with no evidence of complication at the time of discharge. ? ?Consults: pulmonary/intensive care ? ?Significant Diagnostic Studies: angiography:  ?  ?  Prox RCA lesion is 100% stenosed. ?  1st Mrg lesion is 99% stenosed. ?  Prox Cx to Mid Cx lesion is 50% stenosed. ?  Mid LAD lesion is 75% stenosed. ?  2nd Diag lesion is 75% stenosed. ?  The left ventricular systolic function is normal. ?  LV end diastolic pressure is normal. ?  The left ventricular ejection fraction is 55-65% by visual estimate. ?  There is no aortic valve stenosis. ?  ?Severe three vessel disease.  Normal LV function.  Plan for cardiac surgery consult.  ? ? ?Treatments: surgery:  ? ?08/06/2021 ?Patient:  Antonio [name] ?Pre-Op Dx: NSTEMI ?                        Three-vessel coronary artery disease ?                        Hypertension ?  Hyperlipidemia ?Post-op Dx: Same ?Procedure: ?CABG X 4.  LIMA to LAD, left radial artery to obtuse marginal, reverse saphenous vein graft to second diagonal and P AV. ?Endoscopic greater saphenous vein harvest on the right ?Open left radial artery harvest ?  ?  ?Surgeon and Role:   ?   * Lajuana Matte, MD - Primary ?   Evonnie Pat, PA-C- assisting ?An experienced assistant was required given the complexity of this surgery and the standard of surgical care. The assistant was needed for exposure, dissection, suctioning, retraction of delicate tissues and sutures, instrument exchange and for overall help during this procedure.   ?  ? ? ?Discharge Exam: ?Blood pressure 138/89, pulse 81, temperature 97.7 ?F (36.5 ?C), temperature source Oral, resp. rate 14, height '5\' 8"'$  (1.727 m), weight 95.7 kg, SpO2 94 %. ? ?General appearance: alert,  cooperative, and no distress ?Neurologic: intact, pt notes a new small area of numbness at the base of his left thumb. No motor deficits in his left hand.  ?Heart: Stable SR ?Lungs: Breath sounds clear, sats stable on RA. ?Abdomen: soft, NT. ?Extremities: RLE EVH incision and left arm incision are both intact and dry ?Wound:  the sternotomy incision is well  approximated and dry.  ? ?Discharge Medications: ? ?The patient has been discharged on: ? ? ?1.Beta Blocker:  Yes [ X  ] ?                             No   [   ] ?                             If No, reason: ? ?2.Ace Inhibitor/ARB: Yes [   ] ?                                    No  [  x  ] ?                                    If No, reason:  BP too soft, may be started in the outpatient setting ? ?3.Statin:   Yes [ X  ]                  No  [   ] ?                 If No, reason: ? ?4.Ecasa:  Yes  [ X  ] ?                 No   [   ] ?                 If No, reason: ? ?Patient had ACS upon admission: ? ?Plavix/P2Y12 inhibitor: Yes [ X  ] ?                                     No  [   ] ? ? ? ? ?Discharge Instructions   ? ? AMB Referral to Cardiac Rehabilitation - Phase II   Complete by: As directed ?  ? Diagnosis: CABG  ? CABG X ___:  4  ? After initial evaluation and assessments completed: Virtual Based Care may be provided alone or in conjunction with Phase 2 Cardiac Rehab based on patient barriers.: Yes  ? ?  ? ?Allergies as of 08/10/2021   ?No Known Allergies ?  ? ?  ?Medication List  ?  ? ?STOP taking these medications   ? ?ibuprofen 200 MG tablet ?Commonly known as: ADVIL ?  ?naproxen sodium 220 MG tablet ?Commonly known as: ALEVE ?  ? ?  ? ?TAKE these medications   ? ?acetaminophen 500 MG tablet ?Commonly known as: TYLENOL ?Take 1,000 mg by mouth every 6 (six) hours as needed for moderate pain. ?  ?amLODipine 2.5 MG tablet ?Commonly known as: NORVASC ?Take 1 tablet (2.5 mg total) by mouth daily. ?  ?aspirin EC 81 MG tablet ?Take 324 mg by mouth daily as needed  (chest discomfort). Swallow whole. ?  ?atorvastatin 80 MG tablet ?Commonly known as: LIPITOR ?Take 1 tablet (80 mg total) by mouth daily at 6 PM. ?  ?cholecalciferol 25 MCG (1000 UNIT) tablet ?Commonly known as: VITAMIN D3 ?Take 1,000 Units by mouth daily. ?  ?clopidogrel 75 MG tablet ?Commonly known as: PLAVIX ?Take 1 tablet (75 mg total) by mouth daily. ?  ?LIVER DETOX PO ?Take 1 tablet by mouth daily. ?  ?metoprolol tartrate 25 MG tablet ?Commonly known as: LOPRESSOR ?Take 1 tablet (25 mg total) by mouth 2 (two) times daily. ?  ?testosterone cypionate 200 MG/ML injection ?Commonly known as: DEPOTESTOSTERONE CYPIONATE ?SMARTSIG:Milliliter(s) IM ?  ?traMADol 50 MG tablet ?Commonly known as: ULTRAM ?Take 1 tablet (50 mg total) by mouth every 6 (six) hours as needed for up to 7 days for moderate pain. ?  ?vitamin C 1000 MG tablet ?Take 1,000 mg by mouth daily. ?  ? ?  ? ? Follow-up Information   ? ? Lajuana Matte, MD. Call on 08/15/2021.   ?Specialty: Cardiothoracic Surgery ?Why: Your telephone visit with Dr. Kipp Brood is at 3:30pm. ?Contact information: ?[address] ?[place] [address] ?[phone] ? ? ?  ?  ? ? Leanor Kail, PA. Go on 08/28/2021.   ?Specialty: Cardiology ?Why: Your appointment is at 10:55 AM ?Contact information: ?[address] ?[address] ?[place] [address] ?[phone] ? ? ?  ?  ? ?  ?  ? ?  ? ? ?Signed: ?Antony Odea, PA-C ? ?08/10/2021, 8:57 AM ? ? ?

## 2021-08-08 NOTE — Progress Notes (Signed)
? ?NAME:  Antonio [name], MRN:  [id number], DOB:  [birth year], LOS: 6 ?ADMISSION DATE:  08/01/2021, CONSULTATION DATE:  08/06/21 ?REFERRING MD:  Kipp Brood, CHIEF COMPLAINT:  post-CABG  ? ?History of Present Illness:  ?Antonio [name] is a 61 y/o gentleman who presented on 3/23 with DOE and an episode of chest tightness that was relived with rest. He had outpatient troponin levels drawn that were elevated and he was referred to the ED. He has a history of CAD in multiple first degree relatives. He has a history of HLD and HTN managed with lifestyle interventions. Non-smoker. He was admitted to Cardiology and underwent LHC demonstrating 3 vessel CAD. He underwent 4-vessel CABG on 08/06/21 and was transferred to the ICU following his surgery. PCCM consulted for post-op ICU care. ? ?Pertinent  Medical History  ?CAD ?HTN ?HLD ?Basal cell carcinoma ? ?Significant Hospital Events: ?Including procedures, antibiotic start and stop dates in addition to other pertinent events   ?3/23 admitted for NSTEMI, LHC> 3 vessel disease ?3/28 4-vessel CABG ? ?Interim History / Subjective:  ?Metoprolol held last night due to soft blood pressures.  Denies pain.  Has been walking in the halls. ? ?Objective   ?Blood pressure 105/76, pulse 84, temperature 98.3 ?F (36.8 ?C), temperature source Oral, resp. rate 18, height '5\' 8"'$  (1.727 m), weight 99.9 kg, SpO2 93 %. ?CVP:  [6 mmHg-19 mmHg] 18 mmHg ?CO:  [4.9 L/min] 4.9 L/min ?CI:  [2.4 L/min/m2] 2.4 L/min/m2  ?   ? ?Intake/Output Summary (Last 24 hours) at 08/08/2021 0715 ?Last data filed at 08/08/2021 4656 ?Gross per 24 hour  ?Intake 1446.62 ml  ?Output 2425 ml  ?Net -978.38 ml  ? ? ?Filed Weights  ? 08/01/21 1017 08/07/21 0500 08/08/21 0600  ?Weight: 93.4 kg 99.4 kg 99.9 kg  ? ? ?Examination: ?General: Healthy-appearing middle-aged man sitting up in the chair ?HENT: [place]/AT, eyes anicteric ?Lungs: Clear to auscultation bilaterally, breathing comfortably on nasal cannula ?Cardiovascular: S1-S2, regular rate and  rhythm.  Mild bloody output from chest tubes ?Abdomen:, Nontender ?Extremities: Minimal dried blood on bandage left radial arterial graft site.  Minimal pedal edema ?Neuro: Awake and alert, answering questions appropriately, moving all extremities ? ?Na+  132 ?BUN 21 ?Cr 1.14 ?WBC 15.1 ?CXR personally reviewed> improving pulm edema, chest tubes remains in place ? ?Resolved Hospital Problem list   ? ? ?Assessment & Plan:  ? ?CAD with NSTEMI, s/p CABG x 4 on 3/23 (L radial and SVG grafts) ?-Postop care per TCTS.  Removing chest tubes today. ?- Pain control with tramadol, oxycodone, morphine ?- Continue aspirin, Plavix, atorvastatin ?- Continue amlodipine for radial graft ?- Metoprolol twice daily.  Titrate up as blood pressure allows. ?-Continue progressing mobility. ? ?Hyperglycemia, prediabetes ?-Sliding scale insulin as needed.  Minimal requirements. ? ?Obesity, BMI 33 ?-Recommend working towards maintaining a healthy weight long-term ? ?Lung nodule ?-Recommend outpatient follow-up imaging in about 1 month.  Portable chest x-rays would not be reliable for re-demonstrating lower lobe pathology. ? ?Transferring to the floor today.  PCCM will sign off.  Please reconsult with questions.  If he requires pulmonary follow-up of nodule, we are happy to help arrange this. ? ?Best Practice (right click and "Reselect all SmartList Selections" daily)  ? ?Diet/type: Regular consistency (see orders) ?DVT prophylaxis: LMWH ?GI prophylaxis: PPI ?Lines: Central line and yes and it is still needed ?Foley:  N/A ?Code Status:  full code ?Last date of multidisciplinary goals of care discussion '[ ]'$  ? ?Labs   ?CBC: ?  Recent Labs  ?Lab 08/06/21 ?1320 08/06/21 ?1707 08/06/21 ?1900 08/06/21 ?2029 08/07/21 ?0304 08/07/21 ?1650 08/08/21 ?0527  ?WBC 19.3*  --  16.3*  --  17.8* 15.9* 15.1*  ?HGB 15.3  15.4   < > 14.3 13.9 13.5 13.7 12.6*  ?HCT 45.0  45.6   < > 41.7 41.0 40.0 40.8 38.5*  ?MCV 95.2  --  93.7  --  94.1 96.0 96.7  ?PLT 155  --   163  --  168 146* 140*  ? < > = values in this interval not displayed.  ? ? ? ?Basic Metabolic Panel: ?Recent Labs  ?Lab 08/06/21 ?0629 08/06/21 ?0809 08/06/21 ?1144 08/06/21 ?1203 08/06/21 ?1900 08/06/21 ?2029 08/07/21 ?0304 08/07/21 ?1650 08/08/21 ?0527  ?NA 138   < > 135   < > 138 139 135 135 132*  ?K 4.3   < > 5.3*   < > 4.4 4.4 4.4 4.2 4.1  ?CL 108   < > 104  --  108  --  107 102 100  ?CO2 24  --   --   --  23  --  '23 27 27  '$ ?GLUCOSE 108*   < > 157*  --  138*  --  119* 133* 112*  ?BUN 14   < > 14  --  13  --  '15 18 21  '$ ?CREATININE 0.99   < > 0.80  --  0.96  --  1.17 1.15 1.14  ?CALCIUM 9.0  --   --   --  7.6*  --  8.0* 8.1* 8.1*  ?MG  --   --   --   --  2.8*  --  2.6* 2.3  --   ? < > = values in this interval not displayed.  ? ? ?GFR: ?Estimated Creatinine Clearance: 77 mL/min (by C-G formula based on SCr of 1.14 mg/dL). ?Recent Labs  ?Lab 08/06/21 ?1900 08/07/21 ?0304 08/07/21 ?1650 08/08/21 ?0527  ?WBC 16.3* 17.8* 15.9* 15.1*  ? ? ? ?Liver Function Tests: ?Recent Labs  ?Lab 08/06/21 ?5537  ?AST 35  ?ALT 44  ?ALKPHOS 72  ?BILITOT 0.7  ?PROT 6.3*  ?ALBUMIN 3.7  ? ? ?No results for input(s): LIPASE, AMYLASE in the last 168 hours. ?No results for input(s): AMMONIA in the last 168 hours. ? ?ABG ?   ?Component Value Date/Time  ? PHART 7.347 (L) 08/06/2021 2029  ? PCO2ART 42.2 08/06/2021 2029  ? PO2ART 80 (L) 08/06/2021 2029  ? HCO3 23.0 08/06/2021 2029  ? TCO2 24 08/06/2021 2029  ? ACIDBASEDEF 2.0 08/06/2021 2029  ? O2SAT 95 08/06/2021 2029  ? ?  ? ?Coagulation Profile: ?Recent Labs  ?Lab 08/06/21 ?0629 08/06/21 ?1320  ?INR 1.2 1.5*  ? ? ? ?Cardiac Enzymes: ?No results for input(s): CKTOTAL, CKMB, CKMBINDEX, TROPONINI in the last 168 hours. ? ? ? ?Julian Hy, DO 08/08/21 8:22 AM ?[facility] ? ?

## 2021-08-08 NOTE — Progress Notes (Signed)
Pt arrived to unit from  2 heart . VSS, A/O x 4,  CCMD called ,CHG given, pt oriented to unit,Will continue to monitor.  ? ?Phoebe Sharps, RN ? ? ? 08/08/21 1646  ?Vitals  ?Temp 99 ?F (37.2 ?C)  ?Temp Source Oral  ?BP 117/75  ?MAP (mmHg) 87  ?BP Location Right Arm  ?BP Method Automatic  ?Patient Position (if appropriate) Lying  ?Pulse Rate 86  ?Pulse Rate Source Monitor  ?ECG Heart Rate 92  ?Resp 17  ?Level of Consciousness  ?Level of Consciousness Alert  ?Oxygen Therapy  ?SpO2 98 %  ?O2 Device Room Air  ?O2 Flow Rate (L/min) 0 L/min  ?Pain Assessment  ?Pain Scale 0-10  ?Pain Score 6  ?MEWS Score  ?MEWS Temp 0  ?MEWS Systolic 0  ?MEWS Pulse 0  ?MEWS RR 0  ?MEWS LOC 0  ?MEWS Score 0  ?MEWS Score Color Green  ? ? ? ? ?

## 2021-08-08 NOTE — Progress Notes (Signed)
? ?   ?[address] ?      [place] [address] ?            [phone]   ?              ?2 Days Post-Op ?Procedure(s) (LRB): ?CORONARY ARTERY BYPASS GRAFTING X4, With Left Radial Artery Harvest, Right Greater Saphenous Vein Harvested Endoscopically (N/A) ?RADIAL ARTERY HARVEST (Left) ?TRANSESOPHAGEAL ECHOCARDIOGRAM (TEE) (N/A) ? ? ?Events: ?No events ?_______________________________________________________________ ?Vitals: ?BP 105/76   Pulse 84   Temp 98.3 ?F (36.8 ?C) (Oral)   Resp 18   Ht '5\' 8"'$  (1.727 m)   Wt 99.9 kg   SpO2 93%   BMI 33.49 kg/m?  ?Filed Weights  ? 08/01/21 1017 08/07/21 0500 08/08/21 0600  ?Weight: 93.4 kg 99.4 kg 99.9 kg  ? ? ? ?- Neuro: alert NAD ? ?- Cardiovascular: sinus ? ?CVP:  [6 mmHg-19 mmHg] 18 mmHg ? ?- Pulm: EWOB ? ? ?ABG ?   ?Component Value Date/Time  ? PHART 7.347 (L) 08/06/2021 2029  ? PCO2ART 42.2 08/06/2021 2029  ? PO2ART 80 (L) 08/06/2021 2029  ? HCO3 23.0 08/06/2021 2029  ? TCO2 24 08/06/2021 2029  ? ACIDBASEDEF 2.0 08/06/2021 2029  ? O2SAT 95 08/06/2021 2029  ? ? ?- Abd: ND ?- Extremity: trace edema ? ?Marland KitchenIntake/Output   ?   03/29 0701 ?03/30 0700 03/30 0701 ?03/31 0700  ? [address]   ? I.V. (mL/kg) 186.4 (1.9)   ? Blood    ? IV Piggyback 300.2   ? Total Intake(mL/kg) 1446.6 (14.5)   ? Urine (mL/kg/hr) 2125 (0.9)   ? Blood    ? Chest Tube 300   ? Total Output 2425   ? Net -978.4   ?     ?  ? ? ?_______________________________________________________________ ?Labs: ? ?  Latest Ref Rng & Units 08/08/2021  ?  5:27 AM 08/07/2021  ?  4:50 PM 08/07/2021  ?  3:04 AM  ?CBC  ?WBC 4.0 - 10.5 K/uL 15.1   15.9   17.8    ?Hemoglobin 13.0 - 17.0 g/dL 12.6   13.7   13.5    ?Hematocrit 39.0 - 52.0 % 38.5   40.8   40.0    ?Platelets 150 - 400 K/uL 140   146   168    ? ? ?  Latest Ref Rng & Units 08/08/2021  ?  5:27 AM 08/07/2021  ?  4:50 PM 08/07/2021  ?  3:04 AM  ?CMP  ?Glucose 70 - 99 mg/dL 112   133   119    ?BUN 8 - 23 mg/dL '21   18   15    '$ ?Creatinine 0.61 - 1.24 mg/dL  1.14   1.15   1.17    ?Sodium 135 - 145 mmol/L 132   135   135    ?Potassium 3.5 - 5.1 mmol/L 4.1   4.2   4.4    ?Chloride 98 - 111 mmol/L 100   102   107    ?CO2 22 - 32 mmol/L '27   27   23    '$ ?Calcium 8.9 - 10.3 mg/dL 8.1   8.1   8.0    ? ? ?CXR: ?clear ? ?_______________________________________________________________ ? ?Assessment and Plan: ?POD 2 s/p CABG ? ?Neuro: pain controlled ?CV: on amlodipine and plavix, low dose aspirin.  On S/BB ?Pulm: pulm hyg ?Renal: creat stable.  Will diurese ?GI: advancing diet ?Heme: stable ?ID: afebrile ?Endo:  SSI ?Dispo: floor ? ? ?Antonio [name] Antonio [name] ?08/08/2021 9:08 AM ? ? ?

## 2021-08-08 NOTE — Plan of Care (Signed)
  Problem: Education: Goal: Knowledge of General Education information will improve Description: Including pain rating scale, medication(s)/side effects and non-pharmacologic comfort measures Outcome: Progressing   Problem: Health Behavior/Discharge Planning: Goal: Ability to manage health-related needs will improve Outcome: Progressing   Problem: Clinical Measurements: Goal: Ability to maintain clinical measurements within normal limits will improve Outcome: Progressing Goal: Will remain free from infection Outcome: Progressing Goal: Diagnostic test results will improve Outcome: Progressing Goal: Respiratory complications will improve Outcome: Progressing Goal: Cardiovascular complication will be avoided Outcome: Progressing   Problem: Activity: Goal: Risk for activity intolerance will decrease Outcome: Progressing   Problem: Nutrition: Goal: Adequate nutrition will be maintained Outcome: Progressing   Problem: Coping: Goal: Level of anxiety will decrease Outcome: Progressing   Problem: Elimination: Goal: Will not experience complications related to bowel motility Outcome: Progressing Goal: Will not experience complications related to urinary retention Outcome: Progressing   Problem: Pain Managment: Goal: General experience of comfort will improve Outcome: Progressing   Problem: Safety: Goal: Ability to remain free from injury will improve Outcome: Progressing   Problem: Skin Integrity: Goal: Risk for impaired skin integrity will decrease Outcome: Progressing   Problem: Education: Goal: Understanding of CV disease, CV risk reduction, and recovery process will improve Outcome: Progressing Goal: Individualized Educational Video(s) Outcome: Progressing   Problem: Activity: Goal: Ability to return to baseline activity level will improve Outcome: Progressing   Problem: Cardiovascular: Goal: Ability to achieve and maintain adequate cardiovascular perfusion  will improve Outcome: Progressing Goal: Vascular access site(s) Level 0-1 will be maintained Outcome: Progressing   Problem: Health Behavior/Discharge Planning: Goal: Ability to safely manage health-related needs after discharge will improve Outcome: Progressing   Problem: Education: Goal: Will demonstrate proper wound care and an understanding of methods to prevent future damage Outcome: Progressing Goal: Knowledge of disease or condition will improve Outcome: Progressing Goal: Knowledge of the prescribed therapeutic regimen will improve Outcome: Progressing Goal: Individualized Educational Video(s) Outcome: Progressing   Problem: Activity: Goal: Risk for activity intolerance will decrease Outcome: Progressing   Problem: Cardiac: Goal: Will achieve and/or maintain hemodynamic stability Outcome: Progressing   Problem: Clinical Measurements: Goal: Postoperative complications will be avoided or minimized Outcome: Progressing   Problem: Respiratory: Goal: Respiratory status will improve Outcome: Progressing   Problem: Skin Integrity: Goal: Wound healing without signs and symptoms of infection Outcome: Progressing Goal: Risk for impaired skin integrity will decrease Outcome: Progressing   Problem: Urinary Elimination: Goal: Ability to achieve and maintain adequate renal perfusion and functioning will improve Outcome: Progressing   

## 2021-08-09 LAB — BASIC METABOLIC PANEL
Anion gap: 6 (ref 5–15)
BUN: 19 mg/dL (ref 8–23)
CO2: 25 mmol/L (ref 22–32)
Calcium: 8.2 mg/dL — ABNORMAL LOW (ref 8.9–10.3)
Chloride: 102 mmol/L (ref 98–111)
Creatinine, Ser: 0.9 mg/dL (ref 0.61–1.24)
GFR, Estimated: >60 mL/min (ref >60)
Glucose, Bld: 103 mg/dL — ABNORMAL HIGH (ref 70–99)
Potassium: 4.7 mmol/L (ref 3.5–5.1)
Sodium: 133 mmol/L — ABNORMAL LOW (ref 135–145)

## 2021-08-09 LAB — CBC
HCT: 37.1 % — ABNORMAL LOW (ref 39.0–52.0)
Hemoglobin: 12.4 g/dL — ABNORMAL LOW (ref 13.0–17.0)
MCH: 32 pg (ref 26.0–34.0)
MCHC: 33.4 g/dL (ref 30.0–36.0)
MCV: 95.6 fL (ref 80.0–100.0)
Platelets: 139 10*3/uL — ABNORMAL LOW (ref 150–400)
RBC: 3.88 MIL/uL — ABNORMAL LOW (ref 4.22–5.81)
RDW: 13.1 % (ref 11.5–15.5)
WBC: 13.6 10*3/uL — ABNORMAL HIGH (ref 4.0–10.5)
nRBC: 0 % (ref 0.0–0.2)

## 2021-08-09 NOTE — Progress Notes (Signed)
Mobility Specialist Progress Note ? ? 08/09/21 1505  ?Mobility  ?Activity Ambulated independently in hallway  ?Level of Assistance Independent  ?Assistive Device None  ?Distance Ambulated (ft) 790 ft  ?Activity Response Tolerated well  ?$Mobility charge 1 Mobility  ? ?Pre Mobility: 91 HR 97% SpO2 ?During Mobility: 93% HR, 93% SpO2 ?Post Mobility: 88 HR, 98% SpO2 ? ?Received pt in bed having no complaints and agreeable to mobility. Asymptomatic throughout ambulation, returned back to bed w/ call bell in reach and all needs met. ? ?Holland Falling ?Mobility Specialist ?Phone Number [phone] ? ?

## 2021-08-09 NOTE — Progress Notes (Signed)
? ?Progress Note ? ?Patient Name: Antonio [name] ?Date of Encounter: 08/09/2021 ? ?[facility] Cardiologist: Candee Furbish, MD  ? ?Subjective  ? ?Reports he is doing well.  Denies any chest pain or dyspnea ? ?Inpatient Medications  ?  ?Scheduled Meds: ? acetaminophen  1,000 mg Oral Q6H  ? Or  ? acetaminophen (TYLENOL) oral liquid 160 mg/5 mL  1,000 mg Per Tube Q6H  ? amLODipine  2.5 mg Oral Daily  ? aspirin EC  81 mg Oral Daily  ? atorvastatin  80 mg Oral q1800  ? bisacodyl  10 mg Oral Daily  ? Or  ? bisacodyl  10 mg Rectal Daily  ? Chlorhexidine Gluconate Cloth  6 each Topical Daily  ? clopidogrel  75 mg Oral Daily  ? docusate sodium  200 mg Oral Daily  ? enoxaparin (LOVENOX) injection  40 mg Subcutaneous Daily  ? furosemide  40 mg Oral Daily  ? mouth rinse  15 mL Mouth Rinse BID  ? metoprolol tartrate  12.5 mg Oral BID  ? pantoprazole  40 mg Oral Daily  ? potassium chloride  40 mEq Oral Daily  ? sodium chloride flush  3 mL Intravenous Q12H  ? sodium chloride flush  3 mL Intravenous Q12H  ? ?Continuous Infusions: ? sodium chloride    ? sodium chloride    ? ?PRN Meds: ?sodium chloride, metoprolol tartrate, morphine injection, ondansetron (ZOFRAN) IV, oxyCODONE, sodium chloride flush, sodium chloride flush, traMADol  ? ?Vital Signs  ?  ?Vitals:  ? 08/09/21 0523 08/09/21 0734 08/09/21 0752 08/09/21 1150  ?BP: 108/74 116/79  121/80  ?Pulse:    84  ?Resp: 16 15    ?Temp: (!) 97.5 ?F (36.4 ?C)  98.1 ?F (36.7 ?C) 98.2 ?F (36.8 ?C)  ?TempSrc: Oral  Oral Oral  ?SpO2: 93% 96%  98%  ?Weight:      ?Height:      ? ?No intake or output data in the 24 hours ending 08/09/21 1204 ? ? ?  08/08/2021  ?  6:00 AM 08/07/2021  ?  5:00 AM 08/01/2021  ? 10:17 AM  ?Last 3 Weights  ?Weight (lbs) 220 lb 3.8 oz 219 lb 2.2 oz 206 lb  ?Weight (kg) 99.9 kg 99.4 kg 93.441 kg  ?   ? ?Telemetry  ?  ?Normal sinus rhythm rate 70s- Personally Reviewed ? ?ECG  ?  ?No new ECG - Personally Reviewed ? ?Physical Exam  ? ?GEN: No acute distress.   ?Neck: No  JVD ?Cardiac: RRR, no murmurs, rubs, or gallops.  ?Respiratory: Clear to auscultation bilaterally. ?GI: Soft, nontender, non-distended  ?MS: No edema; No deformity. ?Neuro:  Nonfocal  ?Psych: Normal affect  ? ?Labs  ?  ?High Sensitivity Troponin:   ?Recent Labs  ?Lab 08/01/21 ?1028 08/01/21 ?1220  ?TROPONINIHS 162* 169*  ? ?   ?Chemistry ?Recent Labs  ?Lab 08/06/21 ?0629 08/06/21 ?0809 08/06/21 ?1900 08/06/21 ?2029 08/07/21 ?0304 08/07/21 ?1650 08/08/21 ?7673 08/09/21 ?0144  ?NA 138   < > 138   < > 135 135 132* 133*  ?K 4.3   < > 4.4   < > 4.4 4.2 4.1 4.7  ?CL 108   < > 108  --  107 102 100 102  ?CO2 24  --  23  --  '23 27 27 25  '$ ?GLUCOSE 108*   < > 138*  --  119* 133* 112* 103*  ?BUN 14   < > 13  --  '15 18 21 19  '$ ?  CREATININE 0.99   < > 0.96  --  1.17 1.15 1.14 0.90  ?CALCIUM 9.0  --  7.6*  --  8.0* 8.1* 8.1* 8.2*  ?MG  --   --  2.8*  --  2.6* 2.3  --   --   ?PROT 6.3*  --   --   --   --   --   --   --   ?ALBUMIN 3.7  --   --   --   --   --   --   --   ?AST 35  --   --   --   --   --   --   --   ?ALT 44  --   --   --   --   --   --   --   ?ALKPHOS 72  --   --   --   --   --   --   --   ?BILITOT 0.7  --   --   --   --   --   --   --   ?GFRNONAA >60  --  >60  --  >60 >60 >60 >60  ?ANIONGAP 6  --  7  --  '5 6 5 6  '$ ? < > = values in this interval not displayed.  ? ?  ?Lipids  ?No results for input(s): CHOL, TRIG, HDL, LABVLDL, LDLCALC, CHOLHDL in the last 168 hours. ?  ?Hematology ?Recent Labs  ?Lab 08/07/21 ?1650 08/08/21 ?8882 08/09/21 ?0144  ?WBC 15.9* 15.1* 13.6*  ?RBC 4.25 3.98* 3.88*  ?HGB 13.7 12.6* 12.4*  ?HCT 40.8 38.5* 37.1*  ?MCV 96.0 96.7 95.6  ?MCH 32.2 31.7 32.0  ?MCHC 33.6 32.7 33.4  ?RDW 13.2 13.2 13.1  ?PLT 146* 140* 139*  ? ? ?Thyroid No results for input(s): TSH, FREET4 in the last 168 hours.  ?BNP ?No results for input(s): BNP, PROBNP in the last 168 hours. ?  ?DDimer No results for input(s): DDIMER in the last 168 hours.  ? ?Radiology  ?  ?DG Chest Port 1 View ? ?Result Date: 08/08/2021 ?CLINICAL DATA:   Pneumothorax. Shortness of breath. Chest pain. CABG 08/06/2021. EXAM: PORTABLE CHEST 1 VIEW COMPARISON:  08/07/2021 FINDINGS: Right IJ line tip: Upper SVC. Mediastinal drains unchanged. No well-defined pneumothorax. Stable mild linear opacities at the lung bases favoring subsegmental atelectasis. No blunting of the costophrenic angles. Normally aligned sternotomy wires. Upper normal heart size without edema. IMPRESSION: 1. Overall stable radiographic appearance of the chest with unchanged support apparatus and mild bibasilar atelectasis. Electronically Signed   By: Van Clines M.D.   On: 08/08/2021 08:11   ? ?Cardiac Studies  ? ?Left Cardiac Catheterization 08/01/2021: ?  Prox RCA lesion is 100% stenosed. ?  1st Mrg lesion is 99% stenosed. ?  Prox Cx to Mid Cx lesion is 50% stenosed. ?  Mid LAD lesion is 75% stenosed. ?  2nd Diag lesion is 75% stenosed. ?  The left ventricular systolic function is normal. ?  LV end diastolic pressure is normal. ?  The left ventricular ejection fraction is 55-65% by visual estimate. ?  There is no aortic valve stenosis. ?  ?Severe three vessel disease.  Normal LV function.  Plan for cardiac surgery consult.  ?  ?Diagnostic ?Dominance: Right ?Preliminary echo with normal ejection fraction no significant valvular abnormalities ? ?TTE 08/02/21 ? ?IMPRESSIONS  ? ? ? 1. Left ventricular ejection fraction, by estimation, is 60 to 65%.  The  ?left ventricle has normal function. The left ventricle has no regional  ?wall motion abnormalities. There is mild concentric left ventricular  ?hypertrophy. Left ventricular diastolic  ?parameters are indeterminate.  ? 2. Right ventricular systolic function is normal. The right ventricular  ?size is normal. Tricuspid regurgitation signal is inadequate for assessing  ?PA pressure.  ? 3. The mitral valve is normal in structure. No evidence of mitral valve  ?regurgitation. No evidence of mitral stenosis.  ? 4. The aortic valve is tricuspid. There is  mild calcification of the  ?aortic valve. There is mild thickening of the aortic valve. Aortic valve  ?regurgitation is not visualized. No aortic stenosis is present.  ? 5. Aortic dilatation noted. 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  ? 6. The inferior vena cava is normal in size with <50% respiratory  ?variability, suggesting right atrial pressure of 8 mmHg.  ? ?Comparison(s): No prior Echocardiogram.  ? 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  ? ?FINDINGS  ? Left Ventricle: Left ventricular ejection fraction, by estimation, is 60  ?to 65%. The left ventricle has normal function. The left ventricle has no  ?regional wall motion abnormalities. The left ventricular internal cavity  ?size was normal in size. There is  ? mild concentric left ventricular hypertrophy. Left ventricular diastolic  ?parameters are indeterminate.  ? ?Right Ventricle: The right ventricular size is normal. No increase in  ?right ventricular wall thickness. Right ventricular systolic function is  ?normal. Tricuspid regurgitation signal is inadequate for assessing PA  ?pressure.  ? ?Left Atrium: Left atrial size was normal in size.  ? ?Right Atrium: Right atrial size was normal in size.  ? ?Pericardium: There is no evidence of pericardial effusion.  ? ?Mitral Valve: The mitral valve is normal in structure. No evidence of  ?mitral valve regurgitation. No evidence of mitral valve stenosis.  ? ?Tricuspid Valve: The tricuspid valve is normal in structure. Tricuspid  ?valve regurgitation is trivial. No evidence of tricuspid stenosis.  ? ?Aortic Valve: The aortic valve is tricuspid. There is mild calcification  ?of the aortic valve. There is mild thickening of the aortic valve. Aortic  ?valve regurgitation is not visualized. No aortic stenosis is present.  ?Aortic valve peak gradient measures  ?11.4 mmHg.  ? ?Pulmonic Valve: The pulmonic valve was not well  visualized. Pulmonic valve  ?regurgitation is not visualized. No evidence of pulmonic stenosis.  ? ?Aorta: Aortic dilatation noted. 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  ? ?[place]

## 2021-08-09 NOTE — Progress Notes (Signed)
CARDIAC REHAB PHASE I  ? ?Pt seen walking independently for multiple laps at a time without difficulty. Pt denies symptoms. Attempted to completed d/c education, pt with visitors at bedside. Will f/u as able. ? ?Rufina Falco, RN BSN ?08/09/2021 ?2:39 PM ? ?

## 2021-08-09 NOTE — Progress Notes (Signed)
? ?   ?  [address] ?      [place] [address] ?            [phone]   ? ?  ? ?3 Days Post-Op Procedure(s) (LRB): ?CORONARY ARTERY BYPASS GRAFTING X4, With Left Radial Artery Harvest, Right Greater Saphenous Vein Harvested Endoscopically (N/A) ?RADIAL ARTERY HARVEST (Left) ?TRANSESOPHAGEAL ECHOCARDIOGRAM (TEE) (N/A) ?Subjective: ?Transferred from ICU to 4E yesterday.  ?Continues to progress, no new concerns. Nearly independent with ambulation and transfers.  ?ON RA ?BX x 4 yesterday.  ? ?Objective: ?Vital signs in last 24 hours: ?Temp:  [97.5 ?F (36.4 ?C)-99 ?F (37.2 ?C)] 97.5 ?F (36.4 ?C) (03/31 0630) ?Pulse Rate:  [78-88] 86 (03/30 1646) ?Cardiac Rhythm: Normal sinus rhythm;Heart block (03/30 2015) ?Resp:  [13-18] 15 (03/31 0734) ?BP: (104-117)/(69-79) 116/79 (03/31 0734) ?SpO2:  [91 %-98 %] 96 % (03/31 0734) ? ?Hemodynamic parameters for last 24 hours: ?  ? ?Intake/Output from previous day: ?03/30 0701 - 03/31 0700 ?In: 480 [P.O.:480] ?Out: -  ?Intake/Output this shift: ?No intake/output data recorded. ? ?General appearance: alert, cooperative, and no distress ?Neurologic: intact ?Heart: Stable SR ?Lungs: Breath sounds clear, sats stable on RA. ?Abdomen: soft, NT. ?Extremities: RLE EVH incision and left arm incision are both intact and dry ?Wound: the Aquacel dressing was removed and the sternotomy incision is well  approximated and dry.  ? ?Lab Results: ?Recent Labs  ?  08/08/21 ?1601 08/09/21 ?0144  ?WBC 15.1* 13.6*  ?HGB 12.6* 12.4*  ?HCT 38.5* 37.1*  ?PLT 140* 139*  ? ?BMET:  ?Recent Labs  ?  08/08/21 ?0932 08/09/21 ?0144  ?NA 132* 133*  ?K 4.1 4.7  ?CL 100 102  ?CO2 27 25  ?GLUCOSE 112* 103*  ?BUN 21 19  ?CREATININE 1.14 0.90  ?CALCIUM 8.1* 8.2*  ?  ?PT/INR:  ?Recent Labs  ?  08/06/21 ?1320  ?LABPROT 17.7*  ?INR 1.5*  ? ?ABG ?   ?Component Value Date/Time  ? PHART 7.347 (L) 08/06/2021 2029  ? HCO3 23.0 08/06/2021 2029  ? TCO2 24 08/06/2021 2029  ? ACIDBASEDEF 2.0 08/06/2021 2029  ? O2SAT  95 08/06/2021 2029  ? ?CBG (last 3)  ?Recent Labs  ?  08/07/21 ?2315 08/08/21 ?3557 08/08/21 ?1128  ?GLUCAP 106* 124* 116*  ? ? ?Assessment/Plan: ?S/P Procedure(s) (LRB): ?CORONARY ARTERY BYPASS GRAFTING X4, With Left Radial Artery Harvest, Right Greater Saphenous Vein Harvested Endoscopically (N/A) ?RADIAL ARTERY HARVEST (Left) ?TRANSESOPHAGEAL ECHOCARDIOGRAM (TEE) (N/A) ? 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 On ASA, Plavix, BB, and statin. Also on amlodipine for the radial artery graft.  Progressing with ambulation. Stable VS and cardiac rhythm. ? ?-HEME- mild expected ABL anemia. Stable. ? ?-ENDO-No h/o DM. ? ?-RENAL- normal function, Wt~6kg +. Continue diuresis with oral Lasix. ? ?-DVT PPX- on daily enoxaparin ? ?-Disposition- Plan for discharge to home tomorrow if cardiac rhythm stable.    ? ? LOS: 7 days  ? ? ?Antony Odea, PA-C ?[phone] ?08/09/2021 ? ? ?

## 2021-08-09 NOTE — Progress Notes (Signed)
Patient ambulated independently in hallway this AM. Rage Beever, Bettina Gavia ?RN  ?

## 2021-08-09 NOTE — Progress Notes (Signed)
CARDIAC REHAB PHASE I  ? ?Pt seen walking independently in hallway with steady gait. HR 99 SR. Pt states he is having a good day. Will continue to follow. ? ?Rufina Falco, RN BSN ?08/09/2021 ?8:16 AM ? ?

## 2021-08-10 ENCOUNTER — Other Ambulatory Visit: Payer: Self-pay | Admitting: Physician Assistant

## 2021-08-10 ENCOUNTER — Other Ambulatory Visit: Payer: Self-pay | Admitting: Thoracic Surgery (Cardiothoracic Vascular Surgery)

## 2021-08-10 MED ORDER — AMLODIPINE BESYLATE 2.5 MG PO TABS
2.5000 mg | ORAL_TABLET | Freq: Every day | ORAL | 2 refills | Status: DC
  Filled 2021-08-10: qty 30, 30d supply, fill #0

## 2021-08-10 MED ORDER — METOPROLOL TARTRATE 25 MG PO TABS: 25.0000 mg | ORAL_TABLET | Freq: Two times a day (BID) | ORAL | 2 refills | Status: DC

## 2021-08-10 MED ORDER — AMLODIPINE BESYLATE 2.5 MG PO TABS: 2.5000 mg | ORAL_TABLET | Freq: Every day | ORAL | 2 refills | Status: DC

## 2021-08-10 MED ORDER — CLOPIDOGREL BISULFATE 75 MG PO TABS: 75.0000 mg | ORAL_TABLET | Freq: Every day | ORAL | 11 refills | Status: DC

## 2021-08-10 MED ORDER — TRAMADOL HCL 50 MG PO TABS
50.0000 mg | ORAL_TABLET | Freq: Four times a day (QID) | ORAL | 0 refills | Status: DC | PRN
  Filled 2021-08-10: qty 28, 7d supply, fill #0

## 2021-08-10 MED ORDER — METOPROLOL TARTRATE 25 MG PO TABS
25.0000 mg | ORAL_TABLET | Freq: Two times a day (BID) | ORAL | 2 refills | Status: DC
  Filled 2021-08-10: qty 60, 30d supply, fill #0

## 2021-08-10 MED ORDER — CLOPIDOGREL BISULFATE 75 MG PO TABS
75.0000 mg | ORAL_TABLET | Freq: Every day | ORAL | 11 refills | Status: DC
  Filled 2021-08-10: qty 30, 30d supply, fill #0

## 2021-08-10 MED ORDER — ASPIRIN EC 81 MG PO TBEC: 324.0000 mg | DELAYED_RELEASE_TABLET | Freq: Every day | ORAL | 0 refills | Status: DC | PRN

## 2021-08-10 MED ORDER — TRAMADOL HCL 50 MG PO TABS: 50.0000 mg | ORAL_TABLET | Freq: Four times a day (QID) | ORAL | 0 refills | Status: AC | PRN

## 2021-08-10 MED ORDER — ACETAMINOPHEN 500 MG PO TABS: 1000.0000 mg | ORAL_TABLET | Freq: Four times a day (QID) | ORAL | 0 refills | Status: DC | PRN

## 2021-08-10 MED ORDER — TRAMADOL HCL 50 MG PO TABS: 50.0000 mg | ORAL_TABLET | Freq: Four times a day (QID) | ORAL | 0 refills | Status: DC | PRN

## 2021-08-10 MED ORDER — ATORVASTATIN CALCIUM 80 MG PO TABS: 80.0000 mg | ORAL_TABLET | Freq: Every day | ORAL | 2 refills | Status: DC

## 2021-08-10 MED ORDER — ATORVASTATIN CALCIUM 80 MG PO TABS
80.0000 mg | ORAL_TABLET | Freq: Every day | ORAL | 2 refills | Status: DC
Start: 2021-08-10 — End: 2021-08-10
  Filled 2021-08-10: qty 30, 30d supply, fill #0

## 2021-08-10 MED ORDER — CLOPIDOGREL BISULFATE 75 MG PO TABS
75.0000 mg | ORAL_TABLET | Freq: Every day | ORAL | 11 refills | Status: DC
Start: 2021-08-10 — End: 2021-08-10

## 2021-08-10 NOTE — Progress Notes (Addendum)
Patient given discharge instructions. PIV removed. Telemetry box removed, CCMD notified. Patient taken to vehicle in wheelchair by staff. ? ?- Patients wife called back and requested medications to be called into [facility]. PA notified. ? ?Daymon Larsen, RN  ?

## 2021-08-10 NOTE — Progress Notes (Signed)
CARDIAC REHAB PHASE I  ? ?D/c education completed with pt and family. Pt educated on importance of site care and monitoring incisions daily. Encouraged continued IS use, walks, and sternal precautions. Pt given in-the-tube sheet along with heart healthy diet. Reviewed site care, restrictions, and exercise guidelines. Will refer to CRP II [place].  ? ?[phone] ?Rufina Falco, RN BSN ?08/10/2021 ?9:02 AM ? ?

## 2021-08-10 NOTE — Progress Notes (Signed)
? ?   ?  [address] ?      [place] [address] ?            [phone]   ? ?  ? ?4 Days Post-Op Procedure(s) (LRB): ?CORONARY ARTERY BYPASS GRAFTING X4, With Left Radial Artery Harvest, Right Greater Saphenous Vein Harvested Endoscopically (N/A) ?RADIAL ARTERY HARVEST (Left) ?TRANSESOPHAGEAL ECHOCARDIOGRAM (TEE) (N/A) ?Subjective: ? ?Feels well, no concerns. Walked several times independently yesterday.  ? ?On RA, having appropriate bowel function.  ? ? ?Objective: ?Vital signs in last 24 hours: ?Temp:  [97.7 ?F (36.5 ?C)-98.5 ?F (36.9 ?C)] 97.7 ?F (36.5 ?C) (04/01 8242) ?Pulse Rate:  [75-86] 81 (04/01 0826) ?Cardiac Rhythm: Normal sinus rhythm (03/31 1915) ?Resp:  [14-20] 14 (04/01 0826) ?BP: (109-138)/(72-89) 138/89 (04/01 3536) ?SpO2:  [94 %-98 %] 94 % (04/01 0826) ?Weight:  [95.7 kg] 95.7 kg (04/01 0345) ? ?Hemodynamic parameters for last 24 hours: ?  ? ?Intake/Output from previous day: ?03/31 0701 - 04/01 0700 ?In: 120 [P.O.:120] ?Out: 1 [Urine:1] ?Intake/Output this shift: ?No intake/output data recorded. ? ?General appearance: alert, cooperative, and no distress ?Neurologic: intact, pt notes a new small area of numbness at the base of his left thumb. No motor deficits in his left hand.  ?Heart: Stable SR ?Lungs: Breath sounds clear, sats stable on RA. ?Abdomen: soft, NT. ?Extremities: RLE EVH incision and left arm incision are both intact and dry ?Wound:  the sternotomy incision is well  approximated and dry.  ? ?Lab Results: ?Recent Labs  ?  08/08/21 ?1443 08/09/21 ?0144  ?WBC 15.1* 13.6*  ?HGB 12.6* 12.4*  ?HCT 38.5* 37.1*  ?PLT 140* 139*  ? ? ?BMET:  ?Recent Labs  ?  08/08/21 ?1540 08/09/21 ?0144  ?NA 132* 133*  ?K 4.1 4.7  ?CL 100 102  ?CO2 27 25  ?GLUCOSE 112* 103*  ?BUN 21 19  ?CREATININE 1.14 0.90  ?CALCIUM 8.1* 8.2*  ? ?  ?PT/INR:  ?No results for input(s): LABPROT, INR in the last 72 hours. ? ?ABG ?   ?Component Value Date/Time  ? PHART 7.347 (L) 08/06/2021 2029  ? HCO3 23.0  08/06/2021 2029  ? TCO2 24 08/06/2021 2029  ? ACIDBASEDEF 2.0 08/06/2021 2029  ? O2SAT 95 08/06/2021 2029  ? ?CBG (last 3)  ?Recent Labs  ?  08/07/21 ?2315 08/08/21 ?0867 08/08/21 ?1128  ?GLUCAP 106* 124* 116*  ? ? ? ?Assessment/Plan: ?S/P Procedure(s) (LRB): ?CORONARY ARTERY BYPASS GRAFTING X4, With Left Radial Artery Harvest, Right Greater Saphenous Vein Harvested Endoscopically (N/A) ?RADIAL ARTERY HARVEST (Left) ?TRANSESOPHAGEAL ECHOCARDIOGRAM (TEE) (N/A) ? 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 On ASA, Plavix, BB, and statin. Also on amlodipine for the radial artery graft.  Independent with ambulation. Stable VS and cardiac rhythm. ? ?-HEME- mild expected ABL anemia. Stable. ? ?-ENDO-No h/o DM. ? ?-RENAL- normal function, Wt~2.5kg +. Continue diuresis with oral Lasix for a few days. ? ?-Disposition- Plan for discharge to home today. Instructions given.  ? ? LOS: 8 days  ? ? ?Antony Odea, PA-C ?[phone] ?08/10/2021 ? ? ?

## 2021-08-10 NOTE — Progress Notes (Signed)
Mobility Specialist Progress Note  ? ? 08/10/21 1107  ?Mobility  ?Activity Ambulated independently in hallway  ?Level of Assistance Independent  ?Assistive Device None  ?Distance Ambulated (ft) 400 ft  ?Activity Response Tolerated well  ?$Mobility charge 1 Mobility  ? ?Pt received sitting EOB and agreeable. No complaints on walk. Returned to bed with call bell in reach.   ? ?Hildred Alamin ?Mobility Specialist  ?  ?

## 2021-08-12 ENCOUNTER — Other Ambulatory Visit (HOSPITAL_COMMUNITY): Payer: Self-pay

## 2021-08-13 MED FILL — Lidocaine HCl Local Preservative Free (PF) Inj 2%: INTRAMUSCULAR | Qty: 15 | Status: AC

## 2021-08-13 MED FILL — Heparin Sodium (Porcine) Inj 1000 Unit/ML: Qty: 1000 | Status: AC

## 2021-08-13 MED FILL — Potassium Chloride Inj 2 mEq/ML: INTRAVENOUS | Qty: 40 | Status: AC

## 2021-08-15 ENCOUNTER — Ambulatory Visit: Payer: Self-pay | Admitting: Thoracic Surgery (Cardiothoracic Vascular Surgery)

## 2021-08-15 NOTE — Progress Notes (Incomplete)
?   ?  [address] ?      [place] [address] ?            [phone]      ? ?Patient: Home ?Provider: Office ?Consent for Telemedicine visit obtained. ? ?Today?s visit was completed via a real-time telehealth (see specific modality noted below). The patient/authorized person provided oral consent at the time of the visit to engage in a telemedicine encounter with the present provider at [facility]. The patient/authorized person was informed of the potential benefits, limitations, and risks of telemedicine. The patient/authorized person expressed understanding that the laws that protect confidentiality also apply to telemedicine. The patient/authorized person acknowledged understanding that telemedicine does not provide emergency services and that he or she would need to call 911 or proceed to the nearest hospital for help if such a need arose. ? ? Total time spent in the clinical discussion 10 minutes. ? Telehealth Modality: Phone visit (audio only) ? ?I had a telephone visit with  Antonio [name] who is s/p ***.  Overall doing well.   ?Pain is minimal.  Ambulating well. Vitals have been  ***.  Antonio [name] will see [place] back in 1 month with a chest x-ray for cardiac rehab clearance. ? ?Lajuana Matte ? ?

## 2021-08-27 NOTE — Progress Notes (Signed)
?Cardiology Office Note:   ? ?Date:  08/28/2021  ? ?ID:  [name], DOB [birth year], MRN [id number] ? ?PCP:  Bryson Corona, NP  ?[facility] Cardiologist:  Candee Furbish, MD  ?[facility] Electrophysiologist:  None  ? ?Chief Complaint: hospital follow up  ? ?History of Present Illness:   ? ?Antonio [name] is a 61 y.o. male presents  for hospital follow-up.  ? ?Recently admitted in March 2023 with non-STEMI.  Cardiac catheterization showed multivessel CAD.  Underwent CABG x4 (LIMA-LAD, radial-OM, SVG-diagonal and PAV).  No postprocedure complications.  Echo with LV function of 60 to 65%. ? ?Here today for follow-up. Last week he pulled up muscle and since than has chest wall soreness. No recurrent event since then. Walking about 1 mile without any chest pain. He was told not drive for 6 weeks. He is recovering well. Taking medications. Had 3 incidence of lightheadedness while trying to stand up. No LOC.  ? ?Past Medical History:  ?Diagnosis Date  ? Basal cell carcinoma 03/23/2018  ? right upper lip/inf nasolabial  ? Basal cell carcinoma 11/23/2019  ? Right Spinal Upper Back, EDC  ? ? ?Past Surgical History:  ?Procedure Laterality Date  ? CORONARY ARTERY BYPASS GRAFT N/A 08/06/2021  ? Procedure: CORONARY ARTERY BYPASS GRAFTING X4, With Left Radial Artery Harvest, Right Greater Saphenous Vein Harvested Endoscopically;  Surgeon: Lajuana Matte, MD;  Location: [facility];  Service: Open Heart Surgery;  Laterality: N/A;  ? LEFT HEART CATH AND CORONARY ANGIOGRAPHY N/A 08/01/2021  ? Procedure: LEFT HEART CATH AND CORONARY ANGIOGRAPHY;  Surgeon: Jettie Booze, MD;  Location: [place] [facility];  Service: Cardiovascular;  Laterality: N/A;  ? RADIAL ARTERY HARVEST Left 08/06/2021  ? Procedure: RADIAL ARTERY HARVEST;  Surgeon: Lajuana Matte, MD;  Location: [place];  Service: Open Heart Surgery;  Laterality: Left;  ? TEE WITHOUT CARDIOVERSION N/A 08/06/2021  ? Procedure: TRANSESOPHAGEAL ECHOCARDIOGRAM (TEE);  Surgeon:  Lajuana Matte, MD;  Location: [place];  Service: Open Heart Surgery;  Laterality: N/A;  ? ? ?Current Medications: ?Current Meds  ?Medication Sig  ? acetaminophen (TYLENOL) 500 MG tablet Take 2 tablets (1,000 mg total) by mouth every 6 (six) hours as needed for moderate pain.  ? amLODipine (NORVASC) 2.5 MG tablet Take 1 tablet (2.5 mg total) by mouth daily.  ? aspirin EC 81 MG tablet Take 4 tablets (324 mg total) by mouth daily as needed (chest discomfort). Swallow whole.  ? atorvastatin (LIPITOR) 80 MG tablet Take 1 tablet (80 mg total) by mouth daily at 6 PM.  ? cholecalciferol (VITAMIN D3) 25 MCG (1000 UNIT) tablet Take 1,000 Units by mouth daily.  ? clopidogrel (PLAVIX) 75 MG tablet Take 1 tablet (75 mg total) by mouth daily.  ? metoprolol tartrate (LOPRESSOR) 25 MG tablet Take 1 tablet (25 mg total) by mouth 2 (two) times daily.  ? testosterone cypionate (DEPOTESTOSTERONE CYPIONATE) 200 MG/ML injection   ?  ? ?Allergies:   Patient has no known allergies.  ? ?Social History  ? ?Socioeconomic History  ? Marital status: Married  ?  Spouse name: Not on file  ? Number of children: Not on file  ? Years of education: Not on file  ? Highest education level: Not on file  ?Occupational History  ? Not on file  ?Tobacco Use  ? Smoking status: Former  ? Smokeless tobacco: Never  ?Substance and Sexual Activity  ? Alcohol use: Yes  ?  Alcohol/week: 10.0 standard drinks  ?  Types: 10 Standard drinks or equivalent per week  ? Drug use: Never  ? Sexual activity: Not on file  ?Other Topics Concern  ? Not on file  ?Social History Narrative  ? Not on file  ? ?Social Determinants of Health  ? ?Financial Resource Strain: Not on file  ?Food Insecurity: Not on file  ?Transportation Needs: Not on file  ?Physical Activity: Not on file  ?Stress: Not on file  ?Social Connections: Not on file  ?  ? ?Family History: ?The patient's family history is not on file.   ? ?ROS:   ?Please see the history of present illness.    ?All other systems  reviewed and are negative.  ? ?EKGs/Labs/Other Studies Reviewed:   ? ?The following studies were reviewed today: ? ?Echo 07/2021 ?1. Left ventricular ejection fraction, by estimation, is 60 to 65%. The  ?left ventricle has normal function. The left ventricle has no regional  ?wall motion abnormalities. There is mild concentric left ventricular  ?hypertrophy. Left ventricular diastolic  ?parameters are indeterminate.  ? 2. Right ventricular systolic function is normal. The right ventricular  ?size is normal. Tricuspid regurgitation signal is inadequate for assessing  ?PA pressure.  ? 3. The mitral valve is normal in structure. No evidence of mitral valve  ?regurgitation. No evidence of mitral stenosis.  ? 4. The aortic valve is tricuspid. There is mild calcification of the  ?aortic valve. There is mild thickening of the aortic valve. Aortic valve  ?regurgitation is not visualized. No aortic stenosis is present.  ? 5. Aortic dilatation noted. 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  ? 6. The inferior vena cava is normal in size with <50% respiratory  ?variability, suggesting right atrial pressure of 8 mmHg.  ? ?LEFT HEART CATH AND CORONARY ANGIOGRAPHY 08/01/21  ? ?Conclusion ? ?  ?  Prox RCA lesion is 100% stenosed. ?  1st Mrg lesion is 99% stenosed. ?  Prox Cx to Mid Cx lesion is 50% stenosed. ?  Mid LAD lesion is 75% stenosed. ?  2nd Diag lesion is 75% stenosed. ?  The left ventricular systolic function is normal. ?  LV end diastolic pressure is normal. ?  The left ventricular ejection fraction is 55-65% by visual estimate. ?  There is no aortic valve stenosis. ?  ?Severe three vessel disease.  Normal LV function.  Plan for cardiac surgery consult ? ?Diagnostic ?Dominance: Right ? ? ?EKG:  EKG is not  ordered today.   ?Recent Labs: ?08/01/2021: B Natriuretic Peptide 26.3 ?08/06/2021: ALT 44 ?08/07/2021: Magnesium 2.3 ?08/09/2021: BUN 19; Creatinine, Ser 0.90; Hemoglobin 12.4; Platelets 139;  Potassium 4.7; Sodium 133  ?Recent Lipid Panel ?   ?Component Value Date/Time  ? CHOL 192 08/02/2021 0804  ? TRIG 102 08/02/2021 0804  ? TRIG 74 04/21/2006 0727  ? HDL 42 08/02/2021 0804  ? CHOLHDL 4.6 08/02/2021 0804  ? VLDL 20 08/02/2021 0804  ? Mayes 130 (H) 08/02/2021 0804  ? LDLDIRECT 167.2 04/21/2006 0727  ? ? ?Physical Exam:   ? ?VS:  BP 114/70 (BP Location: Right Arm, Patient Position: Sitting, Cuff Size: Normal)   Pulse 89   Ht '5\' 8"'$  (1.727 m)   Wt 198 lb 9.6 oz (90.1 kg)   SpO2 96%   BMI 30.20 kg/m?    ? ?Wt Readings from Last 3 Encounters:  ?08/28/21 198 lb 9.6 oz (90.1 kg)  ?08/10/21 210 lb 15.7 oz (95.7 kg)  ?01/31/20 210 lb (  95.3 kg)  ?  ? ?GEN:  Well nourished, well developed in no acute distress ?HEENT: Normal ?NECK: No JVD; No carotid bruits ?LYMPHATICS: No lymphadenopathy ?CARDIAC: RRR, no murmurs, rubs, gallops ?RESPIRATORY:  Clear to auscultation without rales, wheezing or rhonchi  ?ABDOMEN: Soft, non-tender, non-distended ?MUSCULOSKELETAL:  No edema; No deformity  ?SKIN: Warm and dry ?NEUROLOGIC:  Alert and oriented x 3 ?PSYCHIATRIC:  Normal affect  ? ?ASSESSMENT AND PLAN:  ? ? ?CAD s/p CABG ?- Recovering well. No angina. Continue ASA, Plavix, statin and BB.  ? ?2. HLD ?- 08/02/2021: Cholesterol 192; HDL 42; LDL Cholesterol 130; Triglycerides 102; VLDL 20  ?- Repeat lab in 6 weeks ?- Continue statin  ? ?3. HTN/lightheadedness ?- BP stable. His episode of lightheadedness consistent with orthostatics. No associated palpitations. Encouraged hydration. If recurrent symptoms, he will stop taking amlodipine for few days and let us know.  ? ? ? ? ?Medication Adjustments/Labs and Tests Ordered: ?Current medicines are reviewed at length with the patient today.  Concerns regarding medicines are outlined above.  ?Orders Placed This Encounter  ?Procedures  ? Lipid panel  ? Hepatic function panel  ? ?No orders of the defined types were placed in this encounter. ? ? ?Patient Instructions  ?Medication  Instructions:  ?Your physician recommends that you continue on your current medications as directed. Please refer to the Current Medication list given to you today. ?*If you need a refill on your cardiac medic

## 2021-08-28 ENCOUNTER — Ambulatory Visit (INDEPENDENT_AMBULATORY_CARE_PROVIDER_SITE_OTHER): Payer: Self-pay | Admitting: Physician Assistant

## 2021-08-28 ENCOUNTER — Encounter: Payer: Self-pay | Admitting: Physician Assistant

## 2021-08-28 VITALS — BP 114/70 | HR 89 | Ht 68.0 in | Wt 198.6 lb

## 2021-08-28 DIAGNOSIS — E785 Hyperlipidemia, unspecified: Secondary | ICD-10-CM

## 2021-08-28 DIAGNOSIS — R42 Dizziness and giddiness: Secondary | ICD-10-CM

## 2021-08-28 DIAGNOSIS — Z951 Presence of aortocoronary bypass graft: Secondary | ICD-10-CM

## 2021-08-28 DIAGNOSIS — I10 Essential (primary) hypertension: Secondary | ICD-10-CM

## 2021-08-28 NOTE — Patient Instructions (Signed)
Medication Instructions:  ?Your physician recommends that you continue on your current medications as directed. Please refer to the Current Medication list given to you today. ?*If you need a refill on your cardiac medications before your next appointment, please call your pharmacy* ? ? ?Lab Work: ?Your physician recommends that you return for lab work in: 6 weeks [place] [facility] ?If you have labs (blood work) drawn today and your tests are completely normal, you will receive your results only by: ?MyChart Message (if you have MyChart) OR ?A paper copy in the mail ?If you have any lab test that is abnormal or we need to change your treatment, we will call you to review the results. ? ? ?Testing/Procedures: ?None Ordered ? ? ?Follow-Up: ?At [facility], you and your health needs are our priority.  As part of our continuing mission to provide you with exceptional heart care, we have created designated Provider Care Teams.  These Care Teams include your primary Cardiologist (physician) and Advanced Practice Providers (APPs -  Physician Assistants and Nurse Practitioners) who all work together to provide you with the care you need, when you need it. ? ?We recommend signing up for the patient portal called "MyChart".  Sign up information is provided on this After Visit Summary.  MyChart is used to connect with patients for Virtual Visits (Telemedicine).  Patients are able to view lab/test results, encounter notes, upcoming appointments, etc.  Non-urgent messages can be sent to your provider as well.   ?To learn more about what you can do with MyChart, go to [email or web address].   ? ?Your next appointment:   ?4-5 month(s) ? ?The format for your next appointment:   ?In Person ? ?Provider:   ?Candee Furbish, MD or Robbie Lis, PA  ? ? ?Other Instructions ? ? ?Important Information About [place] ? ? ? ? ?  ?

## 2021-09-10 NOTE — Progress Notes (Signed)
? ?   ?[address] ?      [place] [address] ?            [phone]   ? ?   ?HPI: ?This is a 61 year old male with a past medical history of tobacco abuse, right lower lobe nodule, hyperlipidemia, and basal cell cancer who ?was found to have a NSTEMI.  He underwent a CABG x 4 on 08/06/2021 by Dr. Kipp Brood. He had a "knot"  mid right thigh, but this has decreased. He has complaints of right knee pain and questions if statin could be causing this. He denies chest pain or shortness of breath. He walks several times per day. ? ?Current Outpatient Medications  ?Medication Sig Dispense Refill  ? acetaminophen (TYLENOL) 500 MG tablet Take 2 tablets (1,000 mg total) by mouth every 6 (six) hours as needed for moderate pain. 30 tablet 0  ? amLODipine (NORVASC) 2.5 MG tablet Take 1 tablet (2.5 mg total) by mouth daily. 30 tablet 2  ? Ascorbic Acid (VITAMIN C) 1000 MG tablet Take 1,000 mg by mouth daily. (Patient not taking: Reported on 08/28/2021)    ? aspirin EC 81 MG tablet Take 4 tablets (324 mg total) by mouth daily as needed (chest discomfort). Swallow whole. 30 tablet 0  ? atorvastatin (LIPITOR) 80 MG tablet Take 1 tablet (80 mg total) by mouth daily at 6 PM. 30 tablet 2  ? cholecalciferol (VITAMIN D3) 25 MCG (1000 UNIT) tablet Take 1,000 Units by mouth daily.    ? clopidogrel (PLAVIX) 75 MG tablet Take 1 tablet (75 mg total) by mouth daily. 30 tablet 11  ? metoprolol tartrate (LOPRESSOR) 25 MG tablet Take 1 tablet (25 mg total) by mouth 2 (two) times daily. 60 tablet 2  ? Multiple Vitamins-Minerals (LIVER DETOX PO) Take 1 tablet by mouth daily. (Patient not taking: Reported on 08/28/2021)    ? testosterone cypionate (DEPOTESTOSTERONE CYPIONATE) 200 MG/ML injection     ?Vital Signs: ?Vitals:  ? 09/12/21 1256  ?BP: 120/84  ?Pulse: 71  ?Resp: 20  ?SpO2: 97%  ?  ? ?Physical Exam: ?CV-RRR ?Pulmonary-Clear to auscultation bilaterally ?Extremities-No LE edema. Small "knot' mid thigh-? small hematoma from  [facility] ?Wounds-Sternal wound clean and dry. Small lower area with thickened skin (?keloid) but no sign of infection. RLE wounds clean and dry. LUE wounds clean and dry. Motor/sensory intact. Small piece of suture removed from distal wound. ? ?Diagnostic Tests: ?Narrative & Impression  ?CLINICAL DATA:  History of prior CABG.  Former smoker. ?  ?EXAM: ?CHEST - 2 VIEW ?  ?COMPARISON:  08/08/2021 ?  ?FINDINGS: ?No focal consolidation. No pleural effusion or pneumothorax. Heart ?and mediastinal contours are unremarkable. Prior CABG. ?  ?No acute osseous abnormality. ?  ?IMPRESSION: ?No active cardiopulmonary disease. ?  ?  ?Electronically Signed ?  By: Kathreen Devoid M.D. ?  On: 09/12/2021 12:48 ?   ? ? ?Impression and Plan: ?Patient has been seen by B. Bhagat PA-C from cardiology on 08/28/2021. Patient had complaints of symptoms consistent with ortho stasis. Hydration was encouraged. He has had no further issues with ortho stasis. He was instructed he may return to driving an automobile as long as you are no longer requiring oral narcotic pain relievers (he is not taking narcotics) during the daytime.  It would be wise  ?to start driving only short distances during the daylight and gradually increase from there as you feel comfortable. He was encouraged to  ?enroll and  participate in the outpatient cardiac rehab program beginning as soon as practical. He is scheduled to start cardiac rehab next week. He may continue to gradually increase your  physical activity as tolerated.  Refrain from any heavy lifting or strenuous use of your arms and shoulders for 3 more weeks and avoid activities that cause increased pain in your chest on the side of your surgical incision. Otherwise, you may continue to increase activities without any particular limitations.  Increase the intensity and duration of physical activity gradually. He was instructed to finish the bottle of Amlodipine then stop this medication. Regarding his knee pain, may  be arthritis or ortho related;unsure that is related to Atorvastatin. Patient has been discussing Ozempiq (for uses other than for diabetes or weight loss) with medical doctor. I will defer to PCP and cardiology. Patient will see [place] PRN.  ? ? ? ? ?Nani Skillern, PA-C ?[facility] ?[phone] ? ?

## 2021-09-11 ENCOUNTER — Other Ambulatory Visit: Payer: Self-pay | Admitting: Thoracic Surgery (Cardiothoracic Vascular Surgery)

## 2021-09-11 ENCOUNTER — Encounter: Payer: Self-pay | Attending: Cardiology | Admitting: *Deleted

## 2021-09-11 ENCOUNTER — Encounter: Payer: Self-pay | Admitting: *Deleted

## 2021-09-11 DIAGNOSIS — I252 Old myocardial infarction: Secondary | ICD-10-CM | POA: Insufficient documentation

## 2021-09-11 DIAGNOSIS — Z5189 Encounter for other specified aftercare: Secondary | ICD-10-CM | POA: Insufficient documentation

## 2021-09-11 DIAGNOSIS — Z951 Presence of aortocoronary bypass graft: Secondary | ICD-10-CM

## 2021-09-11 DIAGNOSIS — I214 Non-ST elevation (NSTEMI) myocardial infarction: Secondary | ICD-10-CM

## 2021-09-11 NOTE — Progress Notes (Signed)
Virtual orientation call completed today. he has an appointment on Date: 09/16/2021  for EP eval and gym Orientation.  Documentation of diagnosis can be found in [facility] Date: 08/01/2021 .  ? ?

## 2021-09-12 ENCOUNTER — Ambulatory Visit
Admission: RE | Admit: 2021-09-12 | Discharge: 2021-09-12 | Disposition: A | Discharge status: Home / Self Care | Payer: Self-pay | Source: Ambulatory Visit | Attending: Thoracic Surgery (Cardiothoracic Vascular Surgery) | Admitting: Thoracic Surgery (Cardiothoracic Vascular Surgery)

## 2021-09-12 ENCOUNTER — Ambulatory Visit (INDEPENDENT_AMBULATORY_CARE_PROVIDER_SITE_OTHER): Payer: Self-pay | Admitting: Physician Assistant

## 2021-09-12 VITALS — BP 120/84 | HR 71 | Resp 20 | Ht 68.0 in | Wt 200.0 lb

## 2021-09-12 DIAGNOSIS — Z951 Presence of aortocoronary bypass graft: Secondary | ICD-10-CM

## 2021-09-12 NOTE — Patient Instructions (Signed)
You are encouraged to enroll and participate in the outpatient cardiac rehab program beginning as soon as practical.   You may return to driving an automobile as long as you are no longer requiring oral narcotic pain relievers during the daytime.  It would be wise to start driving only short distances during the daylight and gradually increase from there as you feel comfortable.   You may continue to gradually increase your physical activity as tolerated.  Refrain from any heavy lifting or strenuous use of your arms and shoulders until at least 8 weeks from the time of your surgery, and avoid activities that cause increased pain in your chest on the side of your surgical incision.  Otherwise, you may continue to increase activities without any particular limitations.  Increase the intensity and duration of physical activity gradually.  

## 2021-09-16 ENCOUNTER — Encounter: Payer: Self-pay | Admitting: *Deleted

## 2021-09-16 VITALS — Ht 69.0 in | Wt 205.3 lb

## 2021-09-16 DIAGNOSIS — Z951 Presence of aortocoronary bypass graft: Secondary | ICD-10-CM

## 2021-09-16 DIAGNOSIS — I214 Non-ST elevation (NSTEMI) myocardial infarction: Secondary | ICD-10-CM

## 2021-09-16 NOTE — Progress Notes (Signed)
Cardiac Individual Treatment Plan ? ?Patient Details  ?Name: Antonio [name] ?MRN: [id number] ?Date of Birth: Nov 18, 1960 ?Referring Provider:   ?Flowsheet Row Cardiac Rehab from 09/16/2021 in [facility] Cardiac and Pulmonary Rehab  ?Referring Provider [place]  ? ?  ? ? ?Initial Encounter Date:  ?Flowsheet Row Cardiac Rehab from 09/16/2021 in [facility] Cardiac and Pulmonary Rehab  ?Date 09/16/21  ? ?  ? ? ?Visit Diagnosis: NSTEMI (non-ST elevation myocardial infarction) ([place]) ? ?S/P CABG x 4 ? ?Patient's Home Medications on Admission: ? ?Current Outpatient Medications:  ?  acetaminophen (TYLENOL) 500 MG tablet, Take 2 tablets (1,000 mg total) by mouth every 6 (six) hours as needed for moderate pain., Disp: 30 tablet, Rfl: 0 ?  amLODipine (NORVASC) 2.5 MG tablet, Take 1 tablet (2.5 mg total) by mouth daily., Disp: 30 tablet, Rfl: 2 ?  Ascorbic Acid (VITAMIN C) 1000 MG tablet, Take 1,000 mg by mouth daily., Disp: , Rfl:  ?  aspirin EC 81 MG tablet, Take 4 tablets (324 mg total) by mouth daily as needed (chest discomfort). Swallow whole., Disp: 30 tablet, Rfl: 0 ?  aspirin EC 81 MG tablet, Take 81 mg by mouth daily. Swallow whole., Disp: , Rfl:  ?  atorvastatin (LIPITOR) 80 MG tablet, Take 1 tablet (80 mg total) by mouth daily at 6 PM., Disp: 30 tablet, Rfl: 2 ?  cholecalciferol (VITAMIN D3) 25 MCG (1000 UNIT) tablet, Take 1,000 Units by mouth daily., Disp: , Rfl:  ?  clopidogrel (PLAVIX) 75 MG tablet, Take 1 tablet (75 mg total) by mouth daily., Disp: 30 tablet, Rfl: 11 ?  metoprolol tartrate (LOPRESSOR) 25 MG tablet, Take 1 tablet (25 mg total) by mouth 2 (two) times daily., Disp: 60 tablet, Rfl: 2 ?  Multiple Vitamins-Minerals (LIVER DETOX PO), Take 1 tablet by mouth daily., Disp: , Rfl:  ?  testosterone cypionate (DEPOTESTOSTERONE CYPIONATE) 200 MG/ML injection, , Disp: , Rfl:  ?  vitamin B-12 (CYANOCOBALAMIN) 500 MCG tablet, Take 500 mcg by mouth daily., Disp: , Rfl:  ? ?Past Medical History: ?Past Medical History:  ?Diagnosis Date  ?  Basal cell carcinoma 03/23/2018  ? right upper lip/inf nasolabial  ? Basal cell carcinoma 11/23/2019  ? Right Spinal Upper Back, EDC  ? ? ?Tobacco Use: ?Social History  ? ?Tobacco Use  ?Smoking Status Former  ? Packs/day: 1.00  ? Years: 16.00  ? Pack years: 16.00  ? Types: Cigarettes  ? Quit date: 05/13/1991  ? Years since quitting: 30.3  ?Smokeless Tobacco Never  ?Tobacco Comments  ? Quit 30 years ago  ? ? ?Labs: ?Review Flowsheet   ? ?  ?  Latest Ref Rng & Units 04/21/2006 08/02/2021 08/05/2021 08/06/2021  ?Labs for ITP Cardiac and Pulmonary Rehab  ?Cholestrol 0 - 200 mg/dL 204   192      ?LDL (calc) 0 - 99 mg/dL  130      ?Direct LDL mg/dL 167.2       ?HDL-C >40 mg/dL 39.5   42      ?Trlycerides <150 mg/dL 74   102      ?Hemoglobin A1c 4.8 - 5.6 %  5.6      ?PH, Arterial 7.35 - 7.45   7.46   7.347    ? 7.314    ? 7.268    ? 7.235    ? 7.327    ? 7.370    ? 7.374    ? 7.329    ? 7.320    ?PCO2 arterial  32 - 48 mmHg   34   42.2    ? 38.8    ? 41.6    ? 53.8    ? 46.2    ? 41.6    ? 42.9    ? 50.1    ? 49.7    ?Bicarbonate 20.0 - 28.0 mmol/L   24.2   23.0    ? 19.7    ? 19.2    ? 23.0    ? 24.2    ? 24.0    ? 25.1    ? 24.4    ? 26.4    ? 25.6    ?TCO2 22 - 32 mmol/L    24    ? 21    ? 20    ? 25    ? 26    ? 25    ? 25    ? 26    ? 26    ? 26    ? 28    ? 25    ? 27    ? 27    ?Acid-base deficit 0.0 - 2.0 mmol/L    2.0    ? 6.0    ? 8.0    ? 5.0    ? 2.0    ? 1.0    ? 2.0    ? 1.0    ?O2 Saturation %   97.9   95    ? 95    ? 94    ? 95    ? 100    ? 100    ? 100    ? 85    ? 100    ? 100    ?  ? ? Multiple values from one day are sorted in reverse-chronological order  ?  ?  ? ? ? ?Exercise Target Goals: ?Exercise Program Goal: ?Individual exercise prescription set using results from initial 6 min walk test and THRR while considering  patient?s activity barriers and safety.  ? ?Exercise Prescription Goal: ?Initial exercise prescription builds to 30-45 minutes a day of aerobic activity, 2-3 days per week.  Home exercise  guidelines will be given to patient during program as part of exercise prescription that the participant will acknowledge. ? ? ?Education: Aerobic Exercise: ?- Group verbal and visual presentation on the components of exercise prescription. Introduces F.I.T.T principle from ACSM for exercise prescriptions.  Reviews F.I.T.T. principles of aerobic exercise including progression. Written material given at graduation. ?Flowsheet Row Cardiac Rehab from 09/16/2021 in [facility] Cardiac and Pulmonary Rehab  ?Education need identified 09/16/21  ? ?  ? ? ?Education: Resistance Exercise: ?- Group verbal and visual presentation on the components of exercise prescription. Introduces F.I.T.T principle from ACSM for exercise prescriptions  Reviews F.I.T.T. principles of resistance exercise including progression. Written material given at graduation. ? ?  ?Education: Exercise & Equipment Safety: ?- Individual verbal instruction and demonstration of equipment use and safety with use of the equipment. ?Flowsheet Row Cardiac Rehab from 09/16/2021 in [facility] Cardiac and Pulmonary Rehab  ?Date 09/16/21  ?Educator [place]  ?Instruction Review Code 1- Verbalizes Understanding  ? ?  ? ? ?Education: Exercise Physiology & General Exercise Guidelines: ?- Group verbal and written instruction with models to review the exercise physiology of the cardiovascular system and associated critical values. Provides general exercise guidelines with specific guidelines to those with heart or lung disease.  ? ? ?Education: Flexibility, Balance, Mind/Body Relaxation: ?- Group verbal and visual presentation with interactive activity  on the components of exercise prescription. Introduces F.I.T.T principle from ACSM for exercise prescriptions. Reviews F.I.T.T. principles of flexibility and balance exercise training including progression. Also discusses the mind body connection.  Reviews various relaxation techniques to help reduce and manage stress (i.e. Deep breathing,  progressive muscle relaxation, and visualization). Balance handout provided to take home. Written material given at graduation. ? ? ?Activity Barriers & Risk Stratification: ? Activity Barriers & Cardiac Risk Stratification - 09/11/21 1019   ? ?  ? Activity Barriers & Cardiac Risk Stratification  ? Activity Barriers None   ? Cardiac Risk Stratification High   ? ?  ?  ? ?  ? ? ?6 Minute Walk: ? 6 Minute Walk   ? ? [place] Name 09/16/21 1714  ?  ?  ?  ? 6 Minute Walk  ? Phase Initial    ? Distance 1760 feet    ? Walk Time 6 minutes    ? # of Rest Breaks 0    ? MPH 3.33    ? METS 4.2    ? RPE 12    ? Perceived Dyspnea  0    ? VO2 Peak 14.69    ? Symptoms No    ? Resting HR 71 bpm    ? Resting BP 104/70    ? Resting Oxygen Saturation  96 %    ? Exercise Oxygen Saturation  during 6 min walk 96 %    ? Max Ex. HR 82 bpm    ? Max Ex. BP 134/70    ? 2 Minute Post BP 124/72    ? ?  ?  ? ?  ? ? ?Oxygen Initial Assessment: ? ? ?Oxygen Re-Evaluation: ? ? ?Oxygen Discharge (Final Oxygen Re-Evaluation): ? ? ?Initial Exercise Prescription: ? Initial Exercise Prescription - 09/16/21 1700   ? ?  ? Date of Initial Exercise RX and Referring Provider  ? Date 09/16/21   ? Referring Provider [place]   ?  ? Oxygen  ? Maintain Oxygen Saturation 88% or higher   ?  ? Treadmill  ? MPH 3.5   ? Grade 1   ? Minutes 15   ? METs 4.16   ?  ? Recumbant Elliptical  ? Level 2   ? RPM 50   ? Minutes 15   ? METs 4.2   ?  ? Elliptical  ? Level 1   ? Speed 3   ? Minutes 15   ? METs 4.2   ?  ? REL-XR  ? Level 3   ? Speed 50   ? Minutes 15   ? METs 4.2   ?  ? Prescription Details  ? Frequency (times per week) 3   ? Duration Progress to 30 minutes of continuous aerobic without signs/symptoms of physical distress   ?  ? Intensity  ? THRR 40-80% of Max Heartrate 105-140   ? Ratings of Perceived Exertion 11-13   ? Perceived Dyspnea 0-4   ?  ? Progression  ? Progression Continue to progress workloads to maintain intensity without signs/symptoms of physical distress.    ?  ? Resistance Training  ? Training Prescription Yes   ? Weight 6   ? Reps 10-15   ? ?  ?  ? ?  ? ? ?Perform Capillary Blood Glucose checks as needed. ? ?Exercise Prescription Changes: ? ? Exercise Prescript

## 2021-09-16 NOTE — Patient Instructions (Signed)
Patient Instructions ? ?Patient Details  ?Name: Antonio [name] ?MRN: [id number] ?Date of Birth: [birth year] ?Referring Provider:  Jerline Pain, MD ? ?Below are your personal goals for exercise, nutrition, and risk factors. Our goal is to help you stay on track towards obtaining and maintaining these goals. We will be discussing your progress on these goals with you throughout the program. ? ?Initial Exercise Prescription: ? Initial Exercise Prescription - 09/16/21 1700   ? ?  ? Date of Initial Exercise RX and Referring Provider  ? Date 09/16/21   ? Referring Provider Skains   ?  ? Oxygen  ? Maintain Oxygen Saturation 88% or higher   ?  ? Treadmill  ? MPH 3.5   ? Grade 1   ? Minutes 15   ? METs 4.16   ?  ? Recumbant Elliptical  ? Level 2   ? RPM 50   ? Minutes 15   ? METs 4.2   ?  ? Elliptical  ? Level 1   ? Speed 3   ? Minutes 15   ? METs 4.2   ?  ? REL-XR  ? Level 3   ? Speed 50   ? Minutes 15   ? METs 4.2   ?  ? Prescription Details  ? Frequency (times per week) 3   ? Duration Progress to 30 minutes of continuous aerobic without signs/symptoms of physical distress   ?  ? Intensity  ? THRR 40-80% of Max Heartrate 105-140   ? Ratings of Perceived Exertion 11-13   ? Perceived Dyspnea 0-4   ?  ? Progression  ? Progression Continue to progress workloads to maintain intensity without signs/symptoms of physical distress.   ?  ? Resistance Training  ? Training Prescription Yes   ? Weight 6   ? Reps 10-15   ? ?  ?  ? ?  ? ? ?Exercise Goals: ?Frequency: Be able to perform aerobic exercise two to three times per week in program working toward 2-5 days per week of home exercise. ? ?Intensity: Work with a perceived exertion of 11 (fairly light) - 15 (hard) while following your exercise prescription.  We will make changes to your prescription with you as you progress through the program. ?  ?Duration: Be able to do 30 to 45 minutes of continuous aerobic exercise in addition to a 5 minute warm-up and a 5 minute cool-down routine. ?   ?Nutrition Goals: ?Your personal nutrition goals will be established when you do your nutrition analysis with the dietician. ? ?The following are general nutrition guidelines to follow: ?Cholesterol < '200mg'$ /day ?Sodium < '1500mg'$ /day ?Fiber: Men over 50 yrs - 30 grams per day ? ?Personal Goals: ? Personal Goals and Risk Factors at Admission - 09/16/21 1734   ? ?  ? Core Components/Risk Factors/Patient Goals on Admission  ?  Weight Management Yes;Weight Loss   ? Intervention Weight Management: Develop a combined nutrition and exercise program designed to reach desired caloric intake, while maintaining appropriate intake of nutrient and fiber, sodium and fats, and appropriate energy expenditure required for the weight goal.;Weight Management: Provide education and appropriate resources to help participant work on and attain dietary goals.;Weight Management/Obesity: Establish reasonable short term and long term weight goals.   ? Admit Weight 205 lb 4.8 oz (93.1 kg)   ? Goal Weight: Short Term 200 lb (90.7 kg)   ? Goal Weight: Long Term 186 lb (84.4 kg)   ? Expected Outcomes Short Term: Continue  to assess and modify interventions until short term weight is achieved;Weight Loss: Understanding of general recommendations for a balanced deficit meal plan, which promotes 1-2 lb weight loss per week and includes a negative energy balance of [phone] kcal/d;Understanding of distribution of calorie intake throughout the day with the consumption of 4-5 meals/snacks;Long Term: Adherence to nutrition and physical activity/exercise program aimed toward attainment of established weight goal;Understanding recommendations for meals to include 15-35% energy as protein, 25-35% energy from fat, 35-60% energy from carbohydrates, less than '200mg'$  of dietary cholesterol, 20-35 gm of total fiber daily   ? Lipids Yes   ? Intervention Provide education and support for participant on nutrition & aerobic/resistive exercise along with prescribed  medications to achieve LDL '70mg'$ , HDL >'40mg'$ .   ? Expected Outcomes Short Term: Participant states understanding of desired cholesterol values and is compliant with medications prescribed. Participant is following exercise prescription and nutrition guidelines.;Long Term: Cholesterol controlled with medications as prescribed, with individualized exercise RX and with personalized nutrition plan. Value goals: LDL < '70mg'$ , HDL > 40 mg.   ? ?  ?  ? ?  ? ? ?Tobacco Use Initial Evaluation: ?Social History  ? ?Tobacco Use  ?Smoking Status Former  ? Packs/day: 1.00  ? Years: 16.00  ? Pack years: 16.00  ? Types: Cigarettes  ? Quit date: 05/13/1991  ? Years since quitting: 30.3  ?Smokeless Tobacco Never  ?Tobacco Comments  ? Quit 30 years ago  ? ? ?Exercise Goals and Review: ? Exercise Goals   ? ? [place] Name 09/16/21 1720  ?  ?  ?  ?  ?  ? Exercise Goals  ? Increase Physical Activity Yes      ? Intervention Provide advice, education, support and counseling about physical activity/exercise needs.;Develop an individualized exercise prescription for aerobic and resistive training based on initial evaluation findings, risk stratification, comorbidities and participant's personal goals.      ? Expected Outcomes Short Term: Attend rehab on a regular basis to increase amount of physical activity.;Long Term: Add in home exercise to make exercise part of routine and to increase amount of physical activity.;Long Term: Exercising regularly at least 3-5 days a week.      ? Increase Strength and Stamina Yes      ? Intervention Provide advice, education, support and counseling about physical activity/exercise needs.;Develop an individualized exercise prescription for aerobic and resistive training based on initial evaluation findings, risk stratification, comorbidities and participant's personal goals.      ? Expected Outcomes Short Term: Increase workloads from initial exercise prescription for resistance, speed, and METs.;Short Term: Perform  resistance training exercises routinely during rehab and add in resistance training at home;Long Term: Improve cardiorespiratory fitness, muscular endurance and strength as measured by increased METs and functional capacity (6MWT)      ? Able to understand and use rate of perceived exertion (RPE) scale Yes      ? Intervention Provide education and explanation on how to use RPE scale      ? Expected Outcomes Short Term: Able to use RPE daily in rehab to express subjective intensity level;Long Term:  Able to use RPE to guide intensity level when exercising independently      ? Able to understand and use Dyspnea scale Yes      ? Intervention Provide education and explanation on how to use Dyspnea scale      ? Expected Outcomes Short Term: Able to use Dyspnea scale daily in rehab to express subjective sense of  shortness of breath during exertion;Long Term: Able to use Dyspnea scale to guide intensity level when exercising independently      ? Knowledge and understanding of Target Heart Rate Range (THRR) Yes      ? Intervention Provide education and explanation of THRR including how the numbers were predicted and where they are located for reference      ? Expected Outcomes Short Term: Able to state/look up THRR;Long Term: Able to use THRR to govern intensity when exercising independently;Short Term: Able to use daily as guideline for intensity in rehab      ? Able to check pulse independently Yes      ? Intervention Provide education and demonstration on how to check pulse in carotid and radial arteries.;Review the importance of being able to check your own pulse for safety during independent exercise      ? Expected Outcomes Short Term: Able to explain why pulse checking is important during independent exercise;Long Term: Able to check pulse independently and accurately      ? Understanding of Exercise Prescription Yes      ? Intervention Provide education, explanation, and written materials on patient's individual  exercise prescription      ? Expected Outcomes Short Term: Able to explain program exercise prescription;Long Term: Able to explain home exercise prescription to exercise independently      ? ?  ?  ? ?  ? ?

## 2021-09-18 ENCOUNTER — Telehealth: Payer: Self-pay | Admitting: *Deleted

## 2021-09-18 DIAGNOSIS — I214 Non-ST elevation (NSTEMI) myocardial infarction: Secondary | ICD-10-CM

## 2021-09-18 DIAGNOSIS — Z951 Presence of aortocoronary bypass graft: Secondary | ICD-10-CM

## 2021-09-18 NOTE — Telephone Encounter (Signed)
Antonio [name] contacted the office c/o a "blister" that has popped on his sternal incision. Patient states a blister formed near the bottom of his incision. Patient states he poked the blister with a pin and let it drain. Patient states drainage is thin and clear with a tinge of redness to it. Patient states drainage was scant and has began to dry up. Denies pain, warmth, and redness. Patient sent photos of area via [facility]. Photos assessed. Advised patient that this area looked fine from photos. Advised patient to continue to watch the area and keep it clean and dry. Advised patient to contact the office if it begins to worsen. Patient verbalized understanding.  ?

## 2021-09-18 NOTE — Progress Notes (Signed)
Daily Session Note ? ?Patient Details  ?Name: Antonio [name] ?MRN: [id number] ?Date of Birth: [birth year] ?Referring Provider:   ?Flowsheet Row Cardiac Rehab from 09/16/2021 in [facility] Cardiac and Pulmonary Rehab  ?Referring Provider Skains  ? ?  ? ? ?Encounter Date: 09/18/2021 ? ?Check In: ? Session Check In - 09/18/21 1001   ? ?  ? Check-In  ? Supervising physician immediately available to respond to emergencies See telemetry face sheet for immediately available ER MD   ? Location [facility]-Cardiac & Pulmonary Rehab   ? Staff Present Birdie Sons, MPA, RN;Melissa [place], RDN, LDN;Joseph [place], RCP,RRT,BSRT   ? Virtual Visit No   ? Medication changes reported     No   ? Fall or balance concerns reported    No   ? Warm-up and Cool-down Performed on first and last piece of equipment   ? Resistance Training Performed Yes   ? VAD Patient? No   ? PAD/SET Patient? No   ?  ? Pain Assessment  ? Currently in Pain? No/denies   ? ?  ?  ? ?  ? ? ? ? ? ?Social History  ? ?Tobacco Use  ?Smoking Status Former  ? Packs/day: 1.00  ? Years: 16.00  ? Pack years: 16.00  ? Types: Cigarettes  ? Quit date: 05/13/1991  ? Years since quitting: 30.3  ?Smokeless Tobacco Never  ?Tobacco Comments  ? Quit 30 years ago  ? ? ?Goals Met:  ?Independence with exercise equipment ?Exercise tolerated well ?No report of concerns or symptoms today ?Strength training completed today ? ?Goals Unmet:  ?Not Applicable ? ?Comments: First full day of exercise!  Patient was oriented to gym and equipment including functions, settings, policies, and procedures.  Patient's individual exercise prescription and treatment plan were reviewed.  All starting workloads were established based on the results of the 6 minute walk test done at initial orientation visit.  The plan for exercise progression was also introduced and progression will be customized based on patient's performance and goals. ? ? ? ? ?Dr. Emily Filbert is Medical Director for [facility].  ?Dr. Ottie Glazier is Medical Director for [facility] Pulmonary Rehabilitation. ?

## 2021-09-20 ENCOUNTER — Encounter: Payer: Self-pay | Admitting: *Deleted

## 2021-09-20 DIAGNOSIS — I214 Non-ST elevation (NSTEMI) myocardial infarction: Secondary | ICD-10-CM

## 2021-09-20 DIAGNOSIS — Z951 Presence of aortocoronary bypass graft: Secondary | ICD-10-CM

## 2021-09-20 NOTE — Progress Notes (Signed)
Daily Session Note ? ?Patient Details  ?Name: KEMONTAE DUNKLEE ?MRN: [id number] ?Date of Birth: Feb 21, 1961 ?Referring Provider:   ?Flowsheet Row Cardiac Rehab from 09/16/2021 in [facility] Cardiac and Pulmonary Rehab  ?Referring Provider Skains  ? ?  ? ? ?Encounter Date: 09/20/2021 ? ?Check In: ? Session Check In - 09/20/21 1034   ? ?  ? Check-In  ? Supervising physician immediately available to respond to emergencies See telemetry face sheet for immediately available ER MD   ? Location [facility]-Cardiac & Pulmonary Rehab   ? Staff Present Heath Lark, RN, BSN, CCRP;Laureen Owens Shark, BS, RRT, CPFT;Joseph Beaum[place], [place]   ? Virtual Visit No   ? Medication changes reported     No   ? Fall or balance concerns reported    No   ? Warm-up and Cool-down Performed on first and last piece of equipment   ? Resistance Training Performed Yes   ? VAD Patient? No   ? PAD/SET Patient? No   ?  ? Pain Assessment  ? Currently in Pain? No/denies   ? ?  ?  ? ?  ? ? ? ? ? ?Social History  ? ?Tobacco Use  ?Smoking Status Former  ? Packs/day: 1.00  ? Years: 16.00  ? Pack years: 16.00  ? Types: Cigarettes  ? Quit date: 05/13/1991  ? Years since quitting: 30.3  ?Smokeless Tobacco Never  ?Tobacco Comments  ? Quit 30 years ago  ? ? ?Goals Met:  ?Independence with exercise equipment ?Exercise tolerated well ?No report of concerns or symptoms today ? ?Goals Unmet:  ?Not Applicable ? ?Comments: Pt able to follow exercise prescription today without complaint.  Will continue to monitor for progression. ? ? ? ?Dr. Emily Filbert is Medical Director for [facility].  ?Dr. Ottie Glazier is Medical Director for [facility] Pulmonary Rehabilitation. ?

## 2021-09-23 ENCOUNTER — Encounter: Payer: Self-pay | Admitting: *Deleted

## 2021-09-23 DIAGNOSIS — Z951 Presence of aortocoronary bypass graft: Secondary | ICD-10-CM

## 2021-09-23 DIAGNOSIS — I214 Non-ST elevation (NSTEMI) myocardial infarction: Secondary | ICD-10-CM

## 2021-09-23 NOTE — Progress Notes (Signed)
Daily Session Note ? ?Patient Details  ?Name: Antonio [name] ?MRN: [id number] ?Date of Birth: [birth year] ?Referring Provider:   ?Flowsheet Row Cardiac Rehab from 09/16/2021 in [facility] Cardiac and Pulmonary Rehab  ?Referring Provider Skains  ? ?  ? ? ?Encounter Date: 09/23/2021 ? ?Check In: ? Session Check In - 09/23/21 1023   ? ?  ? Check-In  ? Supervising physician immediately available to respond to emergencies See telemetry face sheet for immediately available ER MD   ? Location [facility]-Cardiac & Pulmonary Rehab   ? Staff Present Heath Lark, RN, BSN, [employer], MPA, RN;Kelly Amedeo Plenty, BS, ACSM CEP, Exercise Physiologist   ? Virtual Visit No   ? Medication changes reported     No   ? Fall or balance concerns reported    No   ? Warm-up and Cool-down Performed on first and last piece of equipment   ? Resistance Training Performed Yes   ? VAD Patient? No   ? PAD/SET Patient? No   ?  ? Pain Assessment  ? Currently in Pain? No/denies   ? ?  ?  ? ?  ? ? ? ? ? ?Social History  ? ?Tobacco Use  ?Smoking Status Former  ? Packs/day: 1.00  ? Years: 16.00  ? Pack years: 16.00  ? Types: Cigarettes  ? Quit date: 05/13/1991  ? Years since quitting: 30.3  ?Smokeless Tobacco Never  ?Tobacco Comments  ? Quit 30 years ago  ? ? ?Goals Met:  ?Independence with exercise equipment ?Exercise tolerated well ?No report of concerns or symptoms today ? ?Goals Unmet:  ?Not Applicable ? ?Comments: Pt able to follow exercise prescription today without complaint.  Will continue to monitor for progression. ? ? ? ?Dr. Emily Filbert is Medical Director for [place].  ?Dr. Ottie Glazier is Medical Director for [facility] Pulmonary Rehabilitation. ?

## 2021-09-25 ENCOUNTER — Encounter: Payer: Self-pay | Admitting: *Deleted

## 2021-09-25 DIAGNOSIS — Z951 Presence of aortocoronary bypass graft: Secondary | ICD-10-CM

## 2021-09-25 DIAGNOSIS — I214 Non-ST elevation (NSTEMI) myocardial infarction: Secondary | ICD-10-CM

## 2021-09-25 NOTE — Progress Notes (Signed)
Cardiac Individual Treatment Plan ? ?Patient Details  ?Name: CANAAN HOLZER ?MRN: [id number] ?Date of Birth: 01-17-1961 ?Referring Provider:   ?Flowsheet Row Cardiac Rehab from 09/16/2021 in [facility] Cardiac and Pulmonary Rehab  ?Referring Provider Skains  ? ?  ? ? ?Initial Encounter Date:  ?Flowsheet Row Cardiac Rehab from 09/16/2021 in [facility] Cardiac and Pulmonary Rehab  ?Date 09/16/21  ? ?  ? ? ?Visit Diagnosis: NSTEMI (non-ST elevation myocardial infarction) ([place]) ? ?S/P CABG x 4 ? ?Patient's Home Medications on Admission: ? ?Current Outpatient Medications:  ?  acetaminophen (TYLENOL) 500 MG tablet, Take 2 tablets (1,000 mg total) by mouth every 6 (six) hours as needed for moderate pain., Disp: 30 tablet, Rfl: 0 ?  amLODipine (NORVASC) 2.5 MG tablet, Take 1 tablet (2.5 mg total) by mouth daily., Disp: 30 tablet, Rfl: 2 ?  Ascorbic Acid (VITAMIN C) 1000 MG tablet, Take 1,000 mg by mouth daily., Disp: , Rfl:  ?  aspirin EC 81 MG tablet, Take 4 tablets (324 mg total) by mouth daily as needed (chest discomfort). Swallow whole., Disp: 30 tablet, Rfl: 0 ?  aspirin EC 81 MG tablet, Take 81 mg by mouth daily. Swallow whole., Disp: , Rfl:  ?  atorvastatin (LIPITOR) 80 MG tablet, Take 1 tablet (80 mg total) by mouth daily at 6 PM., Disp: 30 tablet, Rfl: 2 ?  cholecalciferol (VITAMIN D3) 25 MCG (1000 UNIT) tablet, Take 1,000 Units by mouth daily., Disp: , Rfl:  ?  clopidogrel (PLAVIX) 75 MG tablet, Take 1 tablet (75 mg total) by mouth daily., Disp: 30 tablet, Rfl: 11 ?  metoprolol tartrate (LOPRESSOR) 25 MG tablet, Take 1 tablet (25 mg total) by mouth 2 (two) times daily., Disp: 60 tablet, Rfl: 2 ?  Multiple Vitamins-Minerals (LIVER DETOX PO), Take 1 tablet by mouth daily., Disp: , Rfl:  ?  testosterone cypionate (DEPOTESTOSTERONE CYPIONATE) 200 MG/ML injection, , Disp: , Rfl:  ?  vitamin B-12 (CYANOCOBALAMIN) 500 MCG tablet, Take 500 mcg by mouth daily., Disp: , Rfl:  ? ?Past Medical History: ?Past Medical History:  ?Diagnosis Date  ?  Basal cell carcinoma 03/23/2018  ? right upper lip/inf nasolabial  ? Basal cell carcinoma 11/23/2019  ? Right Spinal Upper Back, EDC  ? ? ?Tobacco Use: ?Social History  ? ?Tobacco Use  ?Smoking Status Former  ? Packs/day: 1.00  ? Years: 16.00  ? Pack years: 16.00  ? Types: Cigarettes  ? Quit date: 05/13/1991  ? Years since quitting: 30.3  ?Smokeless Tobacco Never  ?Tobacco Comments  ? Quit 30 years ago  ? ? ?Labs: ?Review Flowsheet   ? ?  ?  Latest Ref Rng & Units 04/21/2006 08/02/2021 08/05/2021 08/06/2021  ?Labs for [facility] Cardiac and Pulmonary Rehab  ?Cholestrol 0 - 200 mg/dL 204   192      ?LDL (calc) 0 - 99 mg/dL  130      ?Direct LDL mg/dL 167.2       ?HDL-C >40 mg/dL 39.5   42      ?Trlycerides <150 mg/dL 74   102      ?Hemoglobin A1c 4.8 - 5.6 %  5.6      ?PH, Arterial 7.35 - 7.45   7.46   7.347    ? 7.314    ? 7.268    ? 7.235    ? 7.327    ? 7.370    ? 7.374    ? 7.329    ? 7.320    ?PCO2 arterial  32 - 48 mmHg   34   42.2    ? 38.8    ? 41.6    ? 53.8    ? 46.2    ? 41.6    ? 42.9    ? 50.1    ? 49.7    ?Bicarbonate 20.0 - 28.0 mmol/L   24.2   23.0    ? 19.7    ? 19.2    ? 23.0    ? 24.2    ? 24.0    ? 25.1    ? 24.4    ? 26.4    ? 25.6    ?TCO2 22 - 32 mmol/L    24    ? 21    ? 20    ? 25    ? 26    ? 25    ? 25    ? 26    ? 26    ? 26    ? 28    ? 25    ? 27    ? 27    ?Acid-base deficit 0.0 - 2.0 mmol/L    2.0    ? 6.0    ? 8.0    ? 5.0    ? 2.0    ? 1.0    ? 2.0    ? 1.0    ?O2 Saturation %   97.9   95    ? 95    ? 94    ? 95    ? 100    ? 100    ? 100    ? 85    ? 100    ? 100    ?  ? ? Multiple values from one day are sorted in reverse-chronological order  ?  ?  ? ? ? ?Exercise Target Goals: ?Exercise Program Goal: ?Individual exercise prescription set using results from initial 6 min walk test and THRR while considering  patient?s activity barriers and safety.  ? ?Exercise Prescription Goal: ?Initial exercise prescription builds to 30-45 minutes a day of aerobic activity, 2-3 days per week.  Home exercise  guidelines will be given to patient during program as part of exercise prescription that the participant will acknowledge. ? ? ?Education: Aerobic Exercise: ?- Group verbal and visual presentation on the components of exercise prescription. Introduces F.I.T.T principle from ACSM for exercise prescriptions.  Reviews F.I.T.T. principles of aerobic exercise including progression. Written material given at graduation. ?Flowsheet Row Cardiac Rehab from 09/18/2021 in [facility] Cardiac and Pulmonary Rehab  ?Education need identified 09/16/21  ? ?  ? ? ?Education: Resistance Exercise: ?- Group verbal and visual presentation on the components of exercise prescription. Introduces F.I.T.T principle from ACSM for exercise prescriptions  Reviews F.I.T.T. principles of resistance exercise including progression. Written material given at graduation. ?Flowsheet Row Cardiac Rehab from 09/18/2021 in [facility] Cardiac and Pulmonary Rehab  ?Date 09/18/21  ?Educator [facility]  ?Instruction Review Code 1- Verbalizes Understanding  ? ?  ? ?  ?Education: Exercise & Equipment Safety: ?- Individual verbal instruction and demonstration of equipment use and safety with use of the equipment. ?Flowsheet Row Cardiac Rehab from 09/18/2021 in [facility] Cardiac and Pulmonary Rehab  ?Date 09/16/21  ?Educator [place]  ?Instruction Review Code 1- Verbalizes Understanding  ? ?  ? ? ?Education: Exercise Physiology & General Exercise Guidelines: ?- Group verbal and written instruction with models to review the exercise physiology of the cardiovascular system and associated critical values. Provides general  exercise guidelines with specific guidelines to those with heart or lung disease.  ? ? ?Education: Flexibility, Balance, Mind/Body Relaxation: ?- Group verbal and visual presentation with interactive activity on the components of exercise prescription. Introduces F.I.T.T principle from ACSM for exercise prescriptions. Reviews F.I.T.T. principles of flexibility and balance exercise  training including progression. Also discusses the mind body connection.  Reviews various relaxation techniques to help reduce and manage stress (i.e. Deep breathing, progressive muscle relaxation, and visualization). Balance handout provided to take home. Written material given at graduation. ? ? ?Activity Barriers & Risk Stratification: ? Activity Barriers & Cardiac Risk Stratification - 09/11/21 1019   ? ?  ? Activity Barriers & Cardiac Risk Stratification  ? Activity Barriers None   ? Cardiac Risk Stratification High   ? ?  ?  ? ?  ? ? ?6 Minute Walk: ? 6 Minute Walk   ? ? [place] Name 09/16/21 1714  ?  ?  ?  ? 6 Minute Walk  ? Phase Initial    ? Distance 1760 feet    ? Walk Time 6 minutes    ? # of Rest Breaks 0    ? MPH 3.33    ? METS 4.2    ? RPE 12    ? Perceived Dyspnea  0    ? VO2 Peak 14.69    ? Symptoms No    ? Resting HR 71 bpm    ? Resting BP 104/70    ? Resting Oxygen Saturation  96 %    ? Exercise Oxygen Saturation  during 6 min walk 96 %    ? Max Ex. HR 82 bpm    ? Max Ex. BP 134/70    ? 2 Minute Post BP 124/72    ? ?  ?  ? ?  ? ? ?Oxygen Initial Assessment: ? ? ?Oxygen Re-Evaluation: ? ? ?Oxygen Discharge (Final Oxygen Re-Evaluation): ? ? ?Initial Exercise Prescription: ? Initial Exercise Prescription - 09/17/21 0700   ? ?  ? Date of Initial Exercise RX and Referring Provider  ? Date 09/16/21   ? Referring Provider Skains   ?  ? Oxygen  ? Maintain Oxygen Saturation 88% or higher   ?  ? Treadmill  ? MPH 3.5   ? Grade 1   ? Minutes 15   ? METs 4.16   ?  ? Recumbant Elliptical  ? Level 2   ? RPM 50   ? Minutes 15   ? METs 4.2   ?  ? Elliptical  ? Level 1   ? Speed 3   ? Minutes 15   ? METs 4.2   ?  ? REL-XR  ? Level 3   ? Speed 50   ? Minutes 15   ? METs 4.2   ?  ? Prescription Details  ? Frequency (times per week) 3   ?  ? Intensity  ? THRR 40-80% of Max Heartrate 105-140   ? Ratings of Perceived Exertion 11-13   ? Perceived Dyspnea 0-4   ?  ? Progression  ? Progression Continue to progress workloads to  maintain intensity without signs/symptoms of physical distress.   ?  ? Resistance Training  ? Training Prescription Yes   ? Weight 5   ? Reps 10-15   ? ?  ?  ? ?  ? ? ?Perform Capillary Blood Glucose chec

## 2021-09-25 NOTE — Progress Notes (Signed)
Daily Session Note ? ?Patient Details  ?Name: Antonio [name] ?MRN: [id number] ?Date of Birth: 1960/06/03 ?Referring Provider:   ?Flowsheet Row Cardiac Rehab from 09/16/2021 in [facility] Cardiac and Pulmonary Rehab  ?Referring Provider Skains  ? ?  ? ? ?Encounter Date: 09/25/2021 ? ?Check In: ? Session Check In - 09/25/21 0954   ? ?  ? Check-In  ? Supervising physician immediately available to respond to emergencies See telemetry face sheet for immediately available ER MD   ? Location [facility]-Cardiac & Pulmonary Rehab   ? Staff Present Birdie Sons, MPA, RN;Melissa Garnet, RDN, LDN;Joseph Carter, RCP,RRT,BSRT   ? Virtual Visit No   ? Medication changes reported     Yes   ? Comments took self off amlodipine due to dizziness...said MD said he could stop taking to see if dizziness resolved   ? Fall or balance concerns reported    No   ? Warm-up and Cool-down Performed on first and last piece of equipment   ? Resistance Training Performed Yes   ? VAD Patient? No   ? PAD/SET Patient? No   ?  ? Pain Assessment  ? Currently in Pain? No/denies   ? ?  ?  ? ?  ? ? ? ? ? ?Social History  ? ?Tobacco Use  ?Smoking Status Former  ? Packs/day: 1.00  ? Years: 16.00  ? Pack years: 16.00  ? Types: Cigarettes  ? Quit date: 05/13/1991  ? Years since quitting: 30.3  ?Smokeless Tobacco Never  ?Tobacco Comments  ? Quit 30 years ago  ? ? ?Goals Met:  ?Independence with exercise equipment ?Exercise tolerated well ?No report of concerns or symptoms today ?Strength training completed today ? ?Goals Unmet:  ?Not Applicable ? ?Comments: Pt able to follow exercise prescription today without complaint.  Will continue to monitor for progression. ? ? ? ?Dr. Emily Filbert is Medical Director for [place].  ?Dr. Ottie Glazier is Medical Director for [facility] Pulmonary Rehabilitation. ?

## 2021-09-27 ENCOUNTER — Encounter: Payer: Self-pay | Admitting: *Deleted

## 2021-09-27 DIAGNOSIS — I214 Non-ST elevation (NSTEMI) myocardial infarction: Secondary | ICD-10-CM

## 2021-09-27 DIAGNOSIS — Z951 Presence of aortocoronary bypass graft: Secondary | ICD-10-CM

## 2021-09-27 NOTE — Progress Notes (Signed)
Daily Session Note  Patient Details  Name: Antonio [name] MRN: [id number] Date of Birth: [birth year] Referring Provider:   Flowsheet Row Cardiac Rehab from 09/16/2021 in [facility] Cardiac and Pulmonary Rehab  Referring Provider Skains       Encounter Date: 09/27/2021  Check In:  Session Check In - 09/27/21 1025       Check-In   Supervising physician immediately available to respond to emergencies See telemetry face sheet for immediately available ER MD    Location [facility]-Cardiac & Pulmonary Rehab    Staff Present Heath Lark, RN, BSN, CCRP;Jessica [place], MA, RCEP, CCRP, CCET;Joseph [place], [place]    Virtual Visit No    Medication changes reported     No    Fall or balance concerns reported    No    Warm-up and Cool-down Performed on first and last piece of equipment    Resistance Training Performed Yes    VAD Patient? No    PAD/SET Patient? No      Pain Assessment   Currently in Pain? No/denies               Exercise Prescription Changes - 09/27/21 1000       Home Exercise Plan   Plans to continue exercise at Home (comment)   walking, home gym   Frequency Add 2 additional days to program exercise sessions.    Initial Home Exercises Provided 09/27/21             Social History   Tobacco Use  Smoking Status Former   Packs/day: 1.00   Years: 16.00   Pack years: 16.00   Types: Cigarettes   Quit date: 05/13/1991   Years since quitting: 30.3  Smokeless Tobacco Never  Tobacco Comments   Quit 30 years ago    Goals Met:  Independence with exercise equipment Exercise tolerated well No report of concerns or symptoms today  Goals Unmet:  Not Applicable  Comments: Pt able to follow exercise prescription today without complaint.  Will continue to monitor for progression.    Dr. Emily Filbert is Medical Director for [place].  Dr. Ottie Glazier is Medical Director for [facility] Pulmonary Rehabilitation.

## 2021-09-30 ENCOUNTER — Encounter: Payer: Self-pay | Admitting: *Deleted

## 2021-09-30 DIAGNOSIS — I214 Non-ST elevation (NSTEMI) myocardial infarction: Secondary | ICD-10-CM

## 2021-09-30 DIAGNOSIS — Z951 Presence of aortocoronary bypass graft: Secondary | ICD-10-CM

## 2021-09-30 NOTE — Progress Notes (Signed)
Completed initial RD consultation ?

## 2021-09-30 NOTE — Progress Notes (Signed)
Daily Session Note  Patient Details  Name: Antonio [name] MRN: [id number] Date of Birth: 10/07/1960 Referring Provider:   Flowsheet Row Cardiac Rehab from 09/16/2021 in [facility] Cardiac and Pulmonary Rehab  Referring Provider Skains       Encounter Date: 09/30/2021  Check In:  Session Check In - 09/30/21 1044       Check-In   Supervising physician immediately available to respond to emergencies See telemetry face sheet for immediately available ER MD    Location [facility] & Pul[facility] Re[facility]    Staff Present Susanne Bice, RN, BSN, CCRP;Kelly Bollinger, MPA, RN;Kelly Hayes, BS, ACSM CEP, Exercise Physiologist    Virtual Visit No    Medication changes reported     No    Fall or balance concerns reported    No    Warm-up and Cool-down Performed on first and last piece of equipment    Resistance Training Performed Yes    VAD Patient? No    PAD/SET Patient? No      Pain Assessment   Currently in Pain? No/denies                Social History   Tobacco Use  Smoking Status Former   Packs/day: 1.00   Years: 16.00   Pack years: 16.00   Types: Cigarettes   Quit date: 05/13/1991   Years since quitting: 30.4  Smokeless Tobacco Never  Tobacco Comments   Quit 30 years ago    Goals Met:  Independence with exercise equipment Exercise tolerated well No report of concerns or symptoms today  Goals Unmet:  Not Applicable  Comments: Pt able to follow exercise prescription today without complaint.  Will continue to monitor for progression.    Dr. Mark Miller is Medical Director for [facility].  Dr. Fuad Aleskerov is Medical Director for [facility] Pulmonary Rehabilitation. 

## 2021-10-02 DIAGNOSIS — I214 Non-ST elevation (NSTEMI) myocardial infarction: Secondary | ICD-10-CM

## 2021-10-02 DIAGNOSIS — Z951 Presence of aortocoronary bypass graft: Secondary | ICD-10-CM

## 2021-10-02 NOTE — Progress Notes (Signed)
Daily Session Note  Patient Details  Name: BOYKIN BAETZ MRN: [id number] Date of Birth: 07/05/1960 Referring Provider:   Flowsheet Row Cardiac Rehab from 09/16/2021 in [facility] Cardiac and Pulmonary Rehab  Referring Provider Skains       Encounter Date: 10/02/2021  Check In:  Session Check In - 10/02/21 0950       Check-In   Supervising physician immediately available to respond to emergencies See telemetry face sheet for immediately available ER MD    Location [facility] Re[facility]    Staff Present Birdie Sons, MPA, RN;Jessica Luan [place]ing, MA, RCEP, CCRP, CCET;Joseph [place], [place]    Virtual Visit No    Medication changes reported     No    Fall or balance concerns reported    No    Warm-up and Cool-down Performed on first and last piece of equipment    Resistance Training Performed Yes    VAD Patient? No    PAD/SET Patient? No      Pain Assessment   Currently in Pain? No/denies                Social History   Tobacco Use  Smoking Status Former   Packs/day: 1.00   Years: 16.00   Pack years: 16.00   Types: Cigarettes   Quit date: 05/13/1991   Years since quitting: 30.4  Smokeless Tobacco Never  Tobacco Comments   Quit 30 years ago    Goals Met:  Independence with exercise equipment Exercise tolerated well No report of concerns or symptoms today Strength training completed today  Goals Unmet:  Not Applicable  Comments: Pt able to follow exercise prescription today without complaint.  Will continue to monitor for progression.    Dr. Emily Filbert is Medical Director for [place].  Dr. Ottie Glazier is Medical Director for [facility] Pulmonary Rehabilitation.

## 2021-10-09 ENCOUNTER — Other Ambulatory Visit: Payer: Self-pay | Admitting: *Deleted

## 2021-10-09 DIAGNOSIS — E785 Hyperlipidemia, unspecified: Secondary | ICD-10-CM

## 2021-10-09 DIAGNOSIS — I214 Non-ST elevation (NSTEMI) myocardial infarction: Secondary | ICD-10-CM

## 2021-10-09 DIAGNOSIS — Z951 Presence of aortocoronary bypass graft: Secondary | ICD-10-CM

## 2021-10-09 LAB — LIPID PANEL
Chol/HDL Ratio: 3.1 ratio (ref 0.0–5.0)
Cholesterol, Total: 155 mg/dL (ref 100–199)
HDL: 50 mg/dL (ref >39)
LDL Chol Calc (NIH): 88 mg/dL (ref 0–99)
Triglycerides: 90 mg/dL (ref 0–149)
VLDL Cholesterol Cal: 17 mg/dL (ref 5–40)

## 2021-10-09 LAB — HEPATIC FUNCTION PANEL
ALT: 20 IU/L (ref 0–44)
AST: 22 IU/L (ref 0–40)
Albumin: 4.2 g/dL (ref 3.8–4.8)
Alkaline Phosphatase: 113 IU/L (ref 44–121)
Bilirubin Total: 0.9 mg/dL (ref 0.0–1.2)
Bilirubin, Direct: 0.25 mg/dL (ref 0.00–0.40)
Total Protein: 7.2 g/dL (ref 6.0–8.5)

## 2021-10-09 NOTE — Progress Notes (Signed)
Daily Session Note  Patient Details  Name: Antonio [name] MRN: [id number] Date of Birth: 12-Feb-1961 Referring Provider:   Flowsheet Row Cardiac Rehab from 09/16/2021 in [facility] Cardiac and Pulmonary Rehab  Referring Provider Skains       Encounter Date: 10/09/2021  Check In:  Session Check In - 10/09/21 0957       Check-In   Supervising physician immediately available to respond to emergencies See telemetry face sheet for immediately available ER MD    Location [facility] Pul[facility] Re[facility]    Staff Present Birdie Sons, MPA, RN;Joseph Tessie Fass, Sharren [place], MS, ASCM CEP, Exercise Physiologist;Noah Tickle, BS, Exercise Physiologist    Virtual Visit No    Medication changes reported     No    Fall or balance concerns reported    No    Warm-up and Cool-down Performed on first and last piece of equipment    Resistance Training Performed Yes    VAD Patient? No    PAD/SET Patient? No      Pain Assessment   Currently in Pain? No/denies                Social History   Tobacco Use  Smoking Status Former   Packs/day: 1.00   Years: 16.00   Pack years: 16.00   Types: Cigarettes   Quit date: 05/13/1991   Years since quitting: 30.4  Smokeless Tobacco Never  Tobacco Comments   Quit 30 years ago    Goals Met:  Independence with exercise equipment Exercise tolerated well No report of concerns or symptoms today Strength training completed today  Goals Unmet:  Not Applicable  Comments: Pt able to follow exercise prescription today without complaint.  Will continue to monitor for progression.    Dr. Emily Filbert is Medical Director for [place].  Dr. Ottie Glazier is Medical Director for [facility] Pulmonary Rehabilitation.

## 2021-10-11 ENCOUNTER — Encounter: Payer: Self-pay | Attending: Cardiology

## 2021-10-11 DIAGNOSIS — I214 Non-ST elevation (NSTEMI) myocardial infarction: Secondary | ICD-10-CM | POA: Insufficient documentation

## 2021-10-11 DIAGNOSIS — Z951 Presence of aortocoronary bypass graft: Secondary | ICD-10-CM | POA: Insufficient documentation

## 2021-10-16 DIAGNOSIS — Z951 Presence of aortocoronary bypass graft: Secondary | ICD-10-CM

## 2021-10-16 DIAGNOSIS — I214 Non-ST elevation (NSTEMI) myocardial infarction: Secondary | ICD-10-CM

## 2021-10-16 NOTE — Progress Notes (Signed)
Daily Session Note  Patient Details  Name: NAREK KNISS MRN: [id number] Date of Birth: [birth year] Referring Provider:   Flowsheet Row Cardiac Rehab from 09/16/2021 in [facility] Cardiac and Pulmonary Rehab  Referring Provider Skains       Encounter Date: 10/16/2021  Check In:  Session Check In - 10/16/21 0947       Check-In   Supervising physician immediately available to respond to emergencies See telemetry face sheet for immediately available ER MD    Location [facility]-Cardiac & Pulmonary Rehab    Staff Present Carson Myrtle, BS, RRT, CPFT;Joseph Karie Fetch, MPA, RN    Virtual Visit No    Medication changes reported     No    Fall or balance concerns reported    No    Warm-up and Cool-down Performed on first and last piece of equipment    Resistance Training Performed Yes    VAD Patient? No    PAD/SET Patient? No      Pain Assessment   Currently in Pain? No/denies                Social History   Tobacco Use  Smoking Status Former   Packs/day: 1.00   Years: 16.00   Pack years: 16.00   Types: Cigarettes   Quit date: 05/13/1991   Years since quitting: 30.4  Smokeless Tobacco Never  Tobacco Comments   Quit 30 years ago    Goals Met:  Independence with exercise equipment Exercise tolerated well No report of concerns or symptoms today Strength training completed today  Goals Unmet:  Not Applicable  Comments: Pt able to follow exercise prescription today without complaint.  Will continue to monitor for progression.    Dr. Emily Filbert is Medical Director for [place].  Dr. Ottie Glazier is Medical Director for [facility] Pulmonary Rehabilitation.

## 2021-10-23 ENCOUNTER — Encounter: Payer: Self-pay | Admitting: *Deleted

## 2021-10-23 DIAGNOSIS — Z951 Presence of aortocoronary bypass graft: Secondary | ICD-10-CM

## 2021-10-23 DIAGNOSIS — I214 Non-ST elevation (NSTEMI) myocardial infarction: Secondary | ICD-10-CM

## 2021-10-23 NOTE — Progress Notes (Signed)
Cardiac Individual Treatment Plan  Antonio [name] Details  Name: Antonio [name] MRN: [id number] Date of Birth: November 18, 1960 Referring Provider:   Flowsheet Row Cardiac Rehab from 09/16/2021 in [facility] Cardiac and Pulmonary Rehab  Referring Provider Skains       Initial Encounter Date:  Flowsheet Row Cardiac Rehab from 09/16/2021 in [facility] Cardiac and Pulmonary Rehab  Date 09/16/21       Visit Diagnosis: NSTEMI (non-ST elevation myocardial infarction) ([place])  S/P CABG x 4  Antonio [name]'s Home Medications on Admission:  Current Outpatient Medications:    acetaminophen (TYLENOL) 500 MG tablet, Take 2 tablets (1,000 mg total) by mouth every 6 (six) hours as needed for moderate pain., Disp: 30 tablet, Rfl: 0   amLODipine (NORVASC) 2.5 MG tablet, Take 1 tablet (2.5 mg total) by mouth daily., Disp: 30 tablet, Rfl: 2   Ascorbic Acid (VITAMIN C) 1000 MG tablet, Take 1,000 mg by mouth daily., Disp: , Rfl:    aspirin EC 81 MG tablet, Take 4 tablets (324 mg total) by mouth daily as needed (chest discomfort). Swallow whole., Disp: 30 tablet, Rfl: 0   aspirin EC 81 MG tablet, Take 81 mg by mouth daily. Swallow whole., Disp: , Rfl:    atorvastatin (LIPITOR) 80 MG tablet, Take 1 tablet (80 mg total) by mouth daily at 6 PM., Disp: 30 tablet, Rfl: 2   cholecalciferol (VITAMIN D3) 25 MCG (1000 UNIT) tablet, Take 1,000 Units by mouth daily., Disp: , Rfl:    clopidogrel (PLAVIX) 75 MG tablet, Take 1 tablet (75 mg total) by mouth daily., Disp: 30 tablet, Rfl: 11   metoprolol tartrate (LOPRESSOR) 25 MG tablet, Take 1 tablet (25 mg total) by mouth 2 (two) times daily., Disp: 60 tablet, Rfl: 2   Multiple Vitamins-Minerals (LIVER DETOX PO), Take 1 tablet by mouth daily., Disp: , Rfl:    testosterone cypionate (DEPOTESTOSTERONE CYPIONATE) 200 MG/ML injection, , Disp: , Rfl:    vitamin B-12 (CYANOCOBALAMIN) 500 MCG tablet, Take 500 mcg by mouth daily., Disp: , Rfl:   Past Medical History: Past Medical History:  Diagnosis Date    Basal cell carcinoma 03/23/2018   right upper lip/inf nasolabial   Basal cell carcinoma 11/23/2019   Right Spinal Upper Back, EDC    Tobacco Use: Social History   Tobacco Use  Smoking Status Former   Packs/day: 1.00   Years: 16.00   Total pack years: 16.00   Types: Cigarettes   Quit date: 05/13/1991   Years since quitting: 30.4  Smokeless Tobacco Never  Tobacco Comments   Quit 30 years ago    Labs: Review Flowsheet  More data may exist      Latest Ref Rng & Units 04/21/2006 08/02/2021 08/05/2021 08/06/2021  Labs for ITP Cardiac and Pulmonary Rehab  Cholestrol 100 - 199 mg/dL 204  192  - -  LDL (calc) 0 - 99 mg/dL - 130  - -  Direct LDL mg/dL 167.2  - - -  HDL-C >39 mg/dL 39.5  42  - -  Trlycerides 0 - 149 mg/dL 74  102  - -  Hemoglobin A1c 4.8 - 5.6 % - 5.6  - -  PH, Arterial 7.35 - 7.45 - - 7.46  7.347  7.314  7.268  7.235  7.327  7.370  7.374  7.329  7.320   PCO2 arterial 32 - 48 mmHg - - 34  42.2  38.8  41.6  53.8  46.2  41.6  42.9  50.1  49.7   Bicarbonate 20.0 -  28.0 mmol/L - - 24.2  23.0  19.7  19.2  23.0  24.2  24.0  25.1  24.4  26.4  25.6   TCO2 22 - 32 mmol/L - - - $R'24  21  20  25  26  25  25  26  26  26  28  25  27  27   'Ii$ Acid-base deficit 0.0 - 2.0 mmol/L - - - 2.0  6.0  8.0  5.0  2.0  1.0  2.0  1.0   O2 Saturation % - - 97.9  95  95  94  95  100  100  100  85  100  100       10/09/2021  Labs for ITP Cardiac and Pulmonary Rehab  Cholestrol 155   LDL (calc) 88   Direct LDL -  HDL-C 50   Trlycerides 90   Hemoglobin A1c -  PH, Arterial -  PCO2 arterial -  Bicarbonate -  TCO2 -  Acid-base deficit -  O2 Saturation -     Exercise Target Goals: Exercise Program Goal: Individual exercise prescription set using results from initial 6 min walk test and THRR while considering  Antonio [name]'s activity barriers and safety.   Exercise Prescription Goal: Initial exercise prescription builds to 30-45 minutes a day of aerobic activity, 2-3 days per week.  Home exercise  guidelines will be given to Antonio [name] during program as part of exercise prescription that the participant will acknowledge.   Education: Aerobic Exercise: - Group verbal and visual presentation on the components of exercise prescription. Introduces F.I.T.T principle from ACSM for exercise prescriptions.  Reviews F.I.T.T. principles of aerobic exercise including progression. Written material given at graduation. Flowsheet Row Cardiac Rehab from 10/02/2021 in [facility] Cardiac and Pulmonary Rehab  Education need identified 09/16/21       Education: Resistance Exercise: - Group verbal and visual presentation on the components of exercise prescription. Introduces F.I.T.T principle from ACSM for exercise prescriptions  Reviews F.I.T.T. principles of resistance exercise including progression. Written material given at graduation. Flowsheet Row Cardiac Rehab from 10/02/2021 in [facility] Cardiac and Pulmonary Rehab  Date 09/18/21  Educator [facility]  Instruction Review Code 1- Verbalizes Understanding        Education: Exercise & Equipment Safety: - Individual verbal instruction and demonstration of equipment use and safety with use of the equipment. Flowsheet Row Cardiac Rehab from 10/02/2021 in [facility] Cardiac and Pulmonary Rehab  Date 09/16/21  Educator [facility]  Instruction Review Code 1- Verbalizes Understanding       Education: Exercise Physiology & General Exercise Guidelines: - Group verbal and written instruction with models to review the exercise physiology of the cardiovascular system and associated critical values. Provides general exercise guidelines with specific guidelines to those with heart or lung disease.    Education: Flexibility, Balance, Mind/Body Relaxation: - Group verbal and visual presentation with interactive activity on the components of exercise prescription. Introduces F.I.T.T principle from ACSM for exercise prescriptions. Reviews F.I.T.T. principles of flexibility and balance exercise  training including progression. Also discusses the mind body connection.  Reviews various relaxation techniques to help reduce and manage stress (i.e. Deep breathing, progressive muscle relaxation, and visualization). Balance handout provided to take home. Written material given at graduation. Flowsheet Row Cardiac Rehab from 10/02/2021 in [facility] Cardiac and Pulmonary Rehab  Date 09/25/21  Educator [facility]  Instruction Review Code 1- Verbalizes Understanding       Activity Barriers & Risk Stratification:  Activity Barriers & Cardiac Risk Stratification -  09/11/21 1019       Activity Barriers & Cardiac Risk Stratification   Activity Barriers None    Cardiac Risk Stratification High             6 Minute Walk:  6 Minute Walk     Row Name 09/16/21 1714         6 Minute Walk   Phase Initial     Distance 1760 feet     Walk Time 6 minutes     # of Rest Breaks 0     MPH 3.33     METS 4.2     RPE 12     Perceived Dyspnea  0     VO2 Peak 14.69     Symptoms No     Resting HR 71 bpm     Resting BP 104/70     Resting Oxygen Saturation  96 %     Exercise Oxygen Saturation  during 6 min walk 96 %     Max Ex. HR 82 bpm     Max Ex. BP 134/70     2 Minute Post BP 124/72              Oxygen Initial Assessment:   Oxygen Re-Evaluation:   Oxygen Discharge (Final Oxygen Re-Evaluation):   Initial Exercise Prescription:  Initial Exercise Prescription - 09/17/21 0700       Date of Initial Exercise RX and Referring Provider   Date 09/16/21    Referring Provider Skains      Oxygen   Maintain Oxygen Saturation 88% or higher      Treadmill   MPH 3.5    Grade 1    Minutes 15    METs 4.16      Recumbant Elliptical   Level 2    RPM 50    Minutes 15    METs 4.2      Elliptical   Level 1    Speed 3    Minutes 15    METs 4.2      REL-XR   Level 3    Speed 50    Minutes 15    METs 4.2      Prescription Details   Frequency (times per week) 3      Intensity    THRR 40-80% of Max Heartrate 105-140    Ratings of Perceived Exertion 11-13    Perceived Dyspnea 0-4      Progression   Progression Continue to progress workloads to maintain intensity without signs/symptoms of physical distress.      Resistance Training   Training Prescription Yes    Weight 5    Reps 10-15             Perform Capillary Blood Glucose checks as needed.  Exercise Prescription Changes:   Exercise Prescription Changes     Row Name 09/16/21 1700 09/23/21 1600 09/27/21 1000 10/09/21 1500 10/22/21 0800     Response to Exercise   Blood Pressure (Admit) 104/70 126/80 -- 126/70 126/64   Blood Pressure (Exercise) 134/70 124/62 -- 148/70 180/80   Blood Pressure (Exit) 124/72 98/56 -- 100/60 110/60   Heart Rate (Admit) 71 bpm 83 bpm -- 85 bpm 75 bpm   Heart Rate (Exercise) 118 bpm 128 bpm -- 147 bpm 140 bpm   Heart Rate (Exit) 82 bpm 95 bpm -- 100 bpm 106 bpm   Oxygen Saturation (Admit) 96 % -- -- -- --   Oxygen Saturation (Exercise) 96 % -- -- -- --  Oxygen Saturation (Exit) 96 % -- -- -- --   Rating of Perceived Exertion (Exercise) 12 13 -- 15 13   Perceived Dyspnea (Exercise) 0 -- -- -- --   Symptoms none none -- none none   Comments 6 MWT results third full day of exercise -- -- --   Duration -- Continue with 30 min of aerobic exercise without signs/symptoms of physical distress. -- Continue with 30 min of aerobic exercise without signs/symptoms of physical distress. Continue with 30 min of aerobic exercise without signs/symptoms of physical distress.   Intensity -- THRR unchanged -- THRR unchanged THRR unchanged     Progression   Progression -- Continue to progress workloads to maintain intensity without signs/symptoms of physical distress. -- Continue to progress workloads to maintain intensity without signs/symptoms of physical distress. Continue to progress workloads to maintain intensity without signs/symptoms of physical distress.   Average METs -- 4.41 --  5.4 5.84     Resistance Training   Training Prescription -- Yes -- Yes Yes   Weight -- 5 lb -- 5 lb 5 lb   Reps -- 10-15 -- 10-15 10-15     Interval Training   Interval Training -- No -- No No     Treadmill   MPH -- 3.5 -- 3.9 4   Grade -- 2 -- 2.5 3   Minutes -- 15 -- 15 15   METs -- 4.65 -- 5.33 5.72     Recumbant Elliptical   Level -- 4 -- -- --   Minutes -- 15 -- -- --   METs -- 5.5 -- -- --     Elliptical   Level -- -- -- 7 8   Speed -- -- -- -- 3   Minutes -- -- -- 15 15   METs -- -- -- 6.6 6.5     REL-XR   Level -- 8 -- 8 8   Minutes -- 15 -- 15 15   METs -- 3.1 -- 5.3 5.3     Home Exercise Plan   Plans to continue exercise at -- -- Home (comment)  walking, home gym Home (comment)  walking, home gym Home (comment)  walking, home gym   Frequency -- -- Add 2 additional days to program exercise sessions. Add 2 additional days to program exercise sessions. Add 2 additional days to program exercise sessions.   Initial Home Exercises Provided -- -- 09/27/21 09/27/21 09/27/21     Oxygen   Maintain Oxygen Saturation -- 88% or higher -- 88% or higher 88% or higher            Exercise Comments:   Exercise Comments     Row Name 09/18/21 1002           Exercise Comments First full day of exercise!  Antonio [name] was oriented to gym and equipment including functions, settings, policies, and procedures.  Antonio [name]'s individual exercise prescription and treatment plan were reviewed.  All starting workloads were established based on the results of the 6 minute walk test done at initial orientation visit.  The plan for exercise progression was also introduced and progression will be customized based on Antonio [name]'s performance and goals.                Exercise Goals and Review:   Exercise Goals     Row Name 09/16/21 1720             Exercise Goals   Increase Physical Activity Yes  Intervention Provide advice, education, support and counseling about physical  activity/exercise needs.;Develop an individualized exercise prescription for aerobic and resistive training based on initial evaluation findings, risk stratification, comorbidities and participant's personal goals.       Expected Outcomes Short Term: Attend rehab on a regular basis to increase amount of physical activity.;Long Term: Add in home exercise to make exercise part of routine and to increase amount of physical activity.;Long Term: Exercising regularly at least 3-5 days a week.       Increase Strength and Stamina Yes       Intervention Provide advice, education, support and counseling about physical activity/exercise needs.;Develop an individualized exercise prescription for aerobic and resistive training based on initial evaluation findings, risk stratification, comorbidities and participant's personal goals.       Expected Outcomes Short Term: Increase workloads from initial exercise prescription for resistance, speed, and METs.;Short Term: Perform resistance training exercises routinely during rehab and add in resistance training at home;Long Term: Improve cardiorespiratory fitness, muscular endurance and strength as measured by increased METs and functional capacity (6MWT)       Able to understand and use rate of perceived exertion (RPE) scale Yes       Intervention Provide education and explanation on how to use RPE scale       Expected Outcomes Short Term: Able to use RPE daily in rehab to express subjective intensity level;Long Term:  Able to use RPE to guide intensity level when exercising independently       Able to understand and use Dyspnea scale Yes       Intervention Provide education and explanation on how to use Dyspnea scale       Expected Outcomes Short Term: Able to use Dyspnea scale daily in rehab to express subjective sense of shortness of breath during exertion;Long Term: Able to use Dyspnea scale to guide intensity level when exercising independently       Knowledge and  understanding of Target Heart Rate Range (THRR) Yes       Intervention Provide education and explanation of THRR including how the numbers were predicted and where they are located for reference       Expected Outcomes Short Term: Able to state/look up THRR;Long Term: Able to use THRR to govern intensity when exercising independently;Short Term: Able to use daily as guideline for intensity in rehab       Able to check pulse independently Yes       Intervention Provide education and demonstration on how to check pulse in carotid and radial arteries.;Review the importance of being able to check your own pulse for safety during independent exercise       Expected Outcomes Short Term: Able to explain why pulse checking is important during independent exercise;Long Term: Able to check pulse independently and accurately       Understanding of Exercise Prescription Yes       Intervention Provide education, explanation, and written materials on Antonio [name]'s individual exercise prescription       Expected Outcomes Short Term: Able to explain program exercise prescription;Long Term: Able to explain home exercise prescription to exercise independently                Exercise Goals Re-Evaluation :  Exercise Goals Re-Evaluation     Row Name 09/18/21 1002 09/23/21 1614 09/27/21 1016 10/10/21 0705 10/22/21 0837     Exercise Goal Re-Evaluation   Exercise Goals Review Increase Physical Activity;Able to understand and use rate of perceived exertion (  RPE) scale;Knowledge and understanding of Target Heart Rate Range (THRR);Understanding of Exercise Prescription;Able to understand and use Dyspnea scale;Able to check pulse independently;Increase Strength and Stamina Increase Physical Activity;Increase Strength and Stamina;Understanding of Exercise Prescription Increase Physical Activity;Increase Strength and Stamina;Understanding of Exercise Prescription;Able to understand and use rate of perceived exertion (RPE)  scale;Able to understand and use Dyspnea scale;Knowledge and understanding of Target Heart Rate Range (THRR);Able to check pulse independently Increase Physical Activity;Increase Strength and Stamina;Understanding of Exercise Prescription Increase Physical Activity;Increase Strength and Stamina;Understanding of Exercise Prescription   Comments Reviewed RPE and dyspnea scales, THR and program prescription with pt today.  Pt voiced understanding and was given a copy of goals to take home. Antonio [name] is off to a good start in rehab.  He has completed Antonio [name] first three full days in rehab.  He is already up to level 8 on the XR.  We will continue to monitor Antonio [name] progress. Reviewed home exercise with pt today.  Pt plans to walk Antonio [name] land at home for exercise.   He also has weights at home and has a friend that has a home gym he is able to use as well. Reviewed THR, pulse, RPE, sign and symptoms, pulse oximetery and when to call 911 or MD.  Also discussed weather considerations and indoor options.  Pt voiced understanding. Antonio [name] has been doing well in rehab. He increased Antonio [name] average MET level to 5.40 METs. He also increased Antonio [name] speed on the treadmill to 3.9 mph and Antonio [name] grade to 2.5%. He has also consistently hit Antonio [name] THR zone during exercise. He tolerated using 5 lb weights for resistance training. We will continue to monitor Antonio [name]'s progress in the program. Antonio [name] continues to do well in rehab.  He has missed a couple of visits with vacation and work.  He tried level 8 on the ellipitcal but has to reduce back to level 6.  We will continue to monitor Antonio [name] progress.   Expected Outcomes Short: Use RPE daily to regulate intensity. Long: Follow program prescription in THR. Short: Continue to attend rehab regularly Long: Continue to follow program prescription Short: Start to add in more exercise at home Long: Continue to exercise independently Short: Increase weight for resistance training. Long: Continue to improve strength and stamina.  Short: Return to regular attendance in rehab. Long: Continue to improve stamina            Discharge Exercise Prescription (Final Exercise Prescription Changes):  Exercise Prescription Changes - 10/22/21 0800       Response to Exercise   Blood Pressure (Admit) 126/64    Blood Pressure (Exercise) 180/80    Blood Pressure (Exit) 110/60    Heart Rate (Admit) 75 bpm    Heart Rate (Exercise) 140 bpm    Heart Rate (Exit) 106 bpm    Rating of Perceived Exertion (Exercise) 13    Symptoms none    Duration Continue with 30 min of aerobic exercise without signs/symptoms of physical distress.    Intensity THRR unchanged      Progression   Progression Continue to progress workloads to maintain intensity without signs/symptoms of physical distress.    Average METs 5.84      Resistance Training   Training Prescription Yes    Weight 5 lb    Reps 10-15      Interval Training   Interval Training No      Treadmill   MPH 4    Grade 3    Minutes 15  METs 5.72      Elliptical   Level 8    Speed 3    Minutes 15    METs 6.5      REL-XR   Level 8    Minutes 15    METs 5.3      Home Exercise Plan   Plans to continue exercise at Home (comment)   walking, home gym   Frequency Add 2 additional days to program exercise sessions.    Initial Home Exercises Provided 09/27/21      Oxygen   Maintain Oxygen Saturation 88% or higher             Nutrition:  Target Goals: Understanding of nutrition guidelines, daily intake of sodium '1500mg'$ , cholesterol '200mg'$ , calories 30% from fat and 7% or less from saturated fats, daily to have 5 or more servings of fruits and vegetables.  Education: All About Nutrition: -Group instruction provided by verbal, written material, interactive activities, discussions, models, and posters to present general guidelines for heart healthy nutrition including fat, fiber, MyPlate, the role of sodium in heart healthy nutrition, utilization of the nutrition  label, and utilization of this knowledge for meal planning. Follow up email sent as well. Written material given at graduation. Flowsheet Row Cardiac Rehab from 10/02/2021 in [facility] Cardiac and Pulmonary Rehab  Education need identified 09/16/21  Date 10/02/21  Educator [place]  Instruction Review Code 1- Verbalizes Understanding       Biometrics:  Pre Biometrics - 09/16/21 1721       Pre Biometrics   Height $Remov'5\' 9"'svaALg$  (1.753 m)    Weight 205 lb 4.8 oz (93.1 kg)    BMI (Calculated) 30.3    Single Leg Stand 30 seconds              Nutrition Therapy Plan and Nutrition Goals:  Nutrition Therapy & Goals - 09/30/21 1114       Nutrition Therapy   Diet Heart healthy, low Na    Protein (specify units) 75g    Fiber 30 grams    Whole Grain Foods 3 servings    Saturated Fats 16 max. grams    Fruits and Vegetables 8 servings/day    Sodium 2 grams      Personal Nutrition Goals   Nutrition Goal ST: review handouts, brainstorm more last minute meals, take note of the amount of salt eaten during the day LT: limit Na <2g/day, become prepared for last minute meals    Comments 61 y.o. M admitted to rehab s/p NSTEMI. PMHx includes past tobacco use, HLD, right lower lobe nodule, basal cell cancer. PSHx CABGx4 (2023). Relevant medications includes vit C, lipitor, vit D3, MVI (liver detox PO- unspecified name), testosterone cypionate. PYP Score: 66. Vegetables & Fruits 7/12. Breads, Grains & Cereals 7/12. Red & Processed Meat 10/12. Poultry 2/2. Fish & Shellfish 1/4. Beans, Nuts & Seeds 4/4. Milk & Dairy Foods 4/6. Toppings, Oils, Seasonings & Salt 13/20. Sweets, Snacks & Restaurant Food 8/14. Beverages 10/10. B: coffee black (sometimes with half and half), eggs with olive oil L: wife will make him something D: wife cooks - protein, starch, vegetable - usually chicken. Salads. When Antonio [name] wife cooks, he reports eating well and with a good variety; when she doesn't he will go out to eat and have less health  promoting options. Discussed last minute meals and versitile ingredients. Antonio [name] reports using a lot of salt right now - encouraged to reduce slowly to make changes more sustainable. Discussed heart healthy  eating.      Intervention Plan   Intervention Prescribe, educate and counsel regarding individualized specific dietary modifications aiming towards targeted core components such as weight, hypertension, lipid management, diabetes, heart failure and other comorbidities.    Expected Outcomes Short Term Goal: Understand basic principles of dietary content, such as calories, fat, sodium, cholesterol and nutrients.;Short Term Goal: A plan has been developed with personal nutrition goals set during dietitian appointment.;Long Term Goal: Adherence to prescribed nutrition plan.             Nutrition Assessments:  MEDIFICTS Score Key: ?70 Need to make dietary changes  40-70 Heart Healthy Diet ? 40 Therapeutic Level Cholesterol Diet  Flowsheet Row Cardiac Rehab from 09/16/2021 in [facility] Cardiac and Pulmonary Rehab  Picture Your Plate Total Score on Admission 66      Picture Your Plate Scores: <67 Unhealthy dietary pattern with much room for improvement. 41-50 Dietary pattern unlikely to meet recommendations for good health and room for improvement. 51-60 More healthful dietary pattern, with some room for improvement.  >60 Healthy dietary pattern, although there may be some specific behaviors that could be improved.    Nutrition Goals Re-Evaluation:  Nutrition Goals Re-Evaluation     [place] Name 10/16/21 1014             Goals   Current Weight 205 lb (93 kg)       Nutrition Goal Cut back on snacking       Comment Antonio [name] would like to cut back on Antonio [name] snacking. He likes to eat peanut type snacks and other nuts. He states that peanuts are Antonio [name] one snack that he eats to much of. Informed him that peanuts and other nut snacks have high calories.       Expected Outcome Short: cut back on portions  for snacking. Long: maintain a diet that adheres to him.                Nutrition Goals Discharge (Final Nutrition Goals Re-Evaluation):  Nutrition Goals Re-Evaluation - 10/16/21 1014       Goals   Current Weight 205 lb (93 kg)    Nutrition Goal Cut back on snacking    Comment Gautam would like to cut back on Antonio [name] snacking. He likes to eat peanut type snacks and other nuts. He states that peanuts are Antonio [name] one snack that he eats to much of. Informed him that peanuts and other nut snacks have high calories.    Expected Outcome Short: cut back on portions for snacking. Long: maintain a diet that adheres to him.             Psychosocial: Target Goals: Acknowledge presence or absence of significant depression and/or stress, maximize coping skills, provide positive support system. Participant is able to verbalize types and ability to use techniques and skills needed for reducing stress and depression.   Education: Stress, Anxiety, and Depression - Group verbal and visual presentation to define topics covered.  Reviews how body is impacted by stress, anxiety, and depression.  Also discusses healthy ways to reduce stress and to treat/manage anxiety and depression.  Written material given at graduation.   Education: Sleep Hygiene -Provides group verbal and written instruction about how sleep can affect your health.  Define sleep hygiene, discuss sleep cycles and impact of sleep habits. Review good sleep hygiene tips.    Initial Review & Psychosocial Screening:  Initial Psych Review & Screening - 09/11/21 1021       Initial  Review   Current issues with None Identified      Family Dynamics   Good Support System? Yes   wife,children. church family     Barriers   Psychosocial barriers to participate in program There are no identifiable barriers or psychosocial needs.      Screening Interventions   Interventions Encouraged to exercise    Expected Outcomes Short Term goal: Utilizing  psychosocial counselor, staff and physician to assist with identification of specific Stressors or current issues interfering with healing process. Setting desired goal for each stressor or current issue identified.;Long Term Goal: Stressors or current issues are controlled or eliminated.;Short Term goal: Identification and review with participant of any Quality of Life or Depression concerns found by scoring the questionnaire.;Long Term goal: The participant improves quality of Life and PHQ9 Scores as seen by post scores and/or verbalization of changes             Quality of Life Scores:   Quality of Life - 09/16/21 1736       Quality of Life   Select Quality of Life      Quality of Life Scores   Health/Function Pre 23.9 %    Socioeconomic Pre 27.36 %    Psych/Spiritual Pre 24.21 %    Family Pre 25.2 %    GLOBAL Pre 24.87 %            Scores of 19 and below usually indicate a poorer quality of life in these areas.  A difference of  2-3 points is a clinically meaningful difference.  A difference of 2-3 points in the total score of the Quality of Life Index has been associated with significant improvement in overall quality of life, self-image, physical symptoms, and general health in studies assessing change in quality of life.  PHQ-9: Review Flowsheet       09/16/2021  Depression screen PHQ 2/9  Decreased Interest 0  Down, Depressed, Hopeless 0  PHQ - 2 Score 0  Altered sleeping 0  Tired, decreased energy 1  Change in appetite 1  Feeling bad or failure about yourself  0  Trouble concentrating 0  Moving slowly or fidgety/restless 0  Suicidal thoughts 0  PHQ-9 Score 2   Interpretation of Total Score  Total Score Depression Severity:  1-4 = Minimal depression, 5-9 = Mild depression, 10-14 = Moderate depression, 15-19 = Moderately severe depression, 20-27 = Severe depression   Psychosocial Evaluation and Intervention:  Psychosocial Evaluation - 09/11/21 1045        Psychosocial Evaluation & Interventions   Interventions Encouraged to exercise with the program and follow exercise prescription    Comments Antonio [name] has no barriers to attending the program. He lives with Antonio [name] wife. Antonio [name] wife,  Antonio [name] children,granchildren and Antonio [name] church family are all there for support. Antonio [name] goal is to continue to heal and learn all he can to maintain a healthy lifestyle.  He is ready to get started.    Expected Outcomes STG Antonio [name] attends all scheduled exercise and education sessions,LTG Antonio [name] ontinues to progress Antonio [name] exercise regimen and utilizes what he learned in the program to help him maintain a healty lifestyle    Continue Psychosocial Services  Follow up required by staff             Psychosocial Re-Evaluation:  Psychosocial Re-Evaluation     [place] Name 10/16/21 1012             Psychosocial Re-Evaluation   Current issues with None Identified  Comments Antonio [name] reports no issues with their current mental states, sleep, stress, depression or anxiety. Will follow up with Antonio [name] in a few weeks for any changes. He is a man of faith and goes to church regularly.       Expected Outcomes Short: Continue to exercise regularly to support mental health and notify staff of any changes. Long: maintain mental health and well being through teaching of rehab or prescribed medications independently.       Interventions Encouraged to attend Cardiac Rehabilitation for the exercise       Continue Psychosocial Services  Follow up required by staff                Psychosocial Discharge (Final Psychosocial Re-Evaluation):  Psychosocial Re-Evaluation - 10/16/21 1012       Psychosocial Re-Evaluation   Current issues with None Identified    Comments Antonio [name] reports no issues with their current mental states, sleep, stress, depression or anxiety. Will follow up with Antonio [name] in a few weeks for any changes. He is a man of faith and goes to church regularly.    Expected Outcomes Short:  Continue to exercise regularly to support mental health and notify staff of any changes. Long: maintain mental health and well being through teaching of rehab or prescribed medications independently.    Interventions Encouraged to attend Cardiac Rehabilitation for the exercise    Continue Psychosocial Services  Follow up required by staff             Vocational Rehabilitation: Provide vocational rehab assistance to qualifying candidates.   Vocational Rehab Evaluation & Intervention:   Education: Education Goals: Education classes will be provided on a variety of topics geared toward better understanding of heart health and risk factor modification. Participant will state understanding/return demonstration of topics presented as noted by education test scores.  Learning Barriers/Preferences:   General Cardiac Education Topics:  AED/CPR: - Group verbal and written instruction with the use of models to demonstrate the basic use of the AED with the basic ABC's of resuscitation.   Anatomy and Cardiac Procedures: - Group verbal and visual presentation and models provide information about basic cardiac anatomy and function. Reviews the testing methods done to diagnose heart disease and the outcomes of the test results. Describes the treatment choices: Medical Management, Angioplasty, or Coronary Bypass Surgery for treating various heart conditions including Myocardial Infarction, Angina, Valve Disease, and Cardiac Arrhythmias.  Written material given at graduation. Flowsheet Row Cardiac Rehab from 10/02/2021 in [facility] Cardiac and Pulmonary Rehab  Education need identified 09/16/21  Date 09/18/21  Educator SB  Instruction Review Code 1- Verbalizes Understanding       Medication Safety: - Group verbal and visual instruction to review commonly prescribed medications for heart and lung disease. Reviews the medication, class of the drug, and side effects. Includes the steps to properly store  meds and maintain the prescription regimen.  Written material given at graduation.   Intimacy: - Group verbal instruction through game format to discuss how heart and lung disease can affect sexual intimacy. Written material given at graduation..   Know Your Numbers and Heart Failure: - Group verbal and visual instruction to discuss disease risk factors for cardiac and pulmonary disease and treatment options.  Reviews associated critical values for Overweight/Obesity, Hypertension, Cholesterol, and Diabetes.  Discusses basics of heart failure: signs/symptoms and treatments.  Introduces Heart Failure Zone chart for action plan for heart failure.  Written material given at graduation.   Infection Prevention: -  Provides verbal and written material to individual with discussion of infection control including proper hand washing and proper equipment cleaning during exercise session. Flowsheet Row Cardiac Rehab from 10/02/2021 in [facility] Cardiac and Pulmonary Rehab  Date 09/16/21  Educator [facility]  Instruction Review Code 1- Verbalizes Understanding       Falls Prevention: - Provides verbal and written material to individual with discussion of falls prevention and safety. Flowsheet Row Cardiac Rehab from 10/02/2021 in [facility] Cardiac and Pulmonary Rehab  Date 09/16/21  Educator [facility]  Instruction Review Code 1- Verbalizes Understanding       Other: -Provides group and verbal instruction on various topics (see comments)   Knowledge Questionnaire Score:  Knowledge Questionnaire Score - 09/16/21 1737       Knowledge Questionnaire Score   Pre Score 22/26             Core Components/Risk Factors/Antonio [name] Goals at Admission:  Personal Goals and Risk Factors at Admission - 09/16/21 1734       Core Components/Risk Factors/Antonio [name] Goals on Admission    Weight Management Yes;Weight Loss    Intervention Weight Management: Develop a combined nutrition and exercise program designed to reach desired  caloric intake, while maintaining appropriate intake of nutrient and fiber, sodium and fats, and appropriate energy expenditure required for the weight goal.;Weight Management: Provide education and appropriate resources to help participant work on and attain dietary goals.;Weight Management/Obesity: Establish reasonable short term and long term weight goals.    Admit Weight 205 lb 4.8 oz (93.1 kg)    Goal Weight: Short Term 200 lb (90.7 kg)    Goal Weight: Long Term 186 lb (84.4 kg)    Expected Outcomes Short Term: Continue to assess and modify interventions until short term weight is achieved;Weight Loss: Understanding of general recommendations for a balanced deficit meal plan, which promotes 1-2 lb weight loss per week and includes a negative energy balance of [phone] kcal/d;Understanding of distribution of calorie intake throughout the day with the consumption of 4-5 meals/snacks;Long Term: Adherence to nutrition and physical activity/exercise program aimed toward attainment of established weight goal;Understanding recommendations for meals to include 15-35% energy as protein, 25-35% energy from fat, 35-60% energy from carbohydrates, less than $RemoveB'200mg'JsJvEDbD$  of dietary cholesterol, 20-35 gm of total fiber daily    Lipids Yes    Intervention Provide education and support for participant on nutrition & aerobic/resistive exercise along with prescribed medications to achieve LDL '70mg'$ , HDL >$Remo'40mg'NEXqx$ .    Expected Outcomes Short Term: Participant states understanding of desired cholesterol values and is compliant with medications prescribed. Participant is following exercise prescription and nutrition guidelines.;Long Term: Cholesterol controlled with medications as prescribed, with individualized exercise RX and with personalized nutrition plan. Value goals: LDL < $Rem'70mg'zhhS$ , HDL > 40 mg.             Education:Diabetes - Individual verbal and written instruction to review signs/symptoms of diabetes, desired ranges of  glucose level fasting, after meals and with exercise. Acknowledge that pre and post exercise glucose checks will be done for 3 sessions at entry of program.   Core Components/Risk Factors/Antonio [name] Goals Review:   Goals and Risk Factor Review     Row Name 10/16/21 1006             Core Components/Risk Factors/Antonio [name] Goals Review   Personal Goals Review Weight Management/Obesity       Review [place] would like to lose a few pounds. He needs to weed out some snacks that can be attrributing to  Antonio [name] weight maintaining. Antonio [name] most likable snack is peanuts and states he may eat to much of them. Informed him that peanuts and nuts in general have alot of calories.       Expected Outcomes Short: eat less peanuts. Long: reach weight goal.                Core Components/Risk Factors/Antonio [name] Goals at Discharge (Final Review):   Goals and Risk Factor Review - 10/16/21 1006       Core Components/Risk Factors/Antonio [name] Goals Review   Personal Goals Review Weight Management/Obesity    Review [place] would like to lose a few pounds. He needs to weed out some snacks that can be attrributing to Antonio [name] weight maintaining. Antonio [name] most likable snack is peanuts and states he may eat to much of them. Informed him that peanuts and nuts in general have alot of calories.    Expected Outcomes Short: eat less peanuts. Long: reach weight goal.             ITP Comments:  ITP Comments     Row Name 09/11/21 1103 09/16/21 1714 09/18/21 1002 09/25/21 0815 09/30/21 1145   ITP Comments Virtual orientation call completed today. he has an appointment on Date: 09/16/2021  for EP eval and gym Orientation.  Documentation of diagnosis can be found in [facility] Date: 08/01/2021 . Completed 6MWT and gym orientation. Initial ITP created and sent for review to Dr. Emily Filbert, Medical Director. First full day of exercise!  Antonio [name] was oriented to gym and equipment including functions, settings, policies, and procedures.  Antonio [name]'s individual  exercise prescription and treatment plan were reviewed.  All starting workloads were established based on the results of the 6 minute walk test done at initial orientation visit.  The plan for exercise progression was also introduced and progression will be customized based on Antonio [name]'s performance and goals. 30 Day review completed. Medical Director ITP review done, changes made as directed, and signed approval by Medical Director.     New Completed initial RD consultation    [place] Name 10/23/21 0936           ITP Comments 30 Day review completed. Medical Director ITP review done, changes made as directed, and signed approval by Medical Director.                Comments:

## 2021-10-28 ENCOUNTER — Encounter: Payer: Self-pay | Admitting: *Deleted

## 2021-10-28 DIAGNOSIS — I214 Non-ST elevation (NSTEMI) myocardial infarction: Secondary | ICD-10-CM

## 2021-10-28 DIAGNOSIS — Z951 Presence of aortocoronary bypass graft: Secondary | ICD-10-CM

## 2021-10-28 NOTE — Progress Notes (Signed)
Daily Session Note  Patient Details  Name: Antonio [name] MRN: [id number] Date of Birth: Sep 12, 1960 Referring Provider:   Flowsheet Row Cardiac Rehab from 09/16/2021 in [facility] Cardiac and Pulmonary Rehab  Referring Provider Skains       Encounter Date: 10/28/2021  Check In:  Session Check In - 10/28/21 1008       Check-In   Supervising physician immediately available to respond to emergencies See telemetry face sheet for immediately available ER MD    Location [facility]-Cardiac & Pulmonary Rehab    Staff Present Heath Lark, RN, BSN, Laveda Norman, BS, ACSM CEP, Exercise Physiologist;Kelly Rosalia Hammers, MPA, RN    Virtual Visit No    Medication changes reported     No    Fall or balance concerns reported    No    Warm-up and Cool-down Performed on first and last piece of equipment    Resistance Training Performed Yes    VAD Patient? No    PAD/SET Patient? No      Pain Assessment   Currently in Pain? No/denies                Social History   Tobacco Use  Smoking Status Former   Packs/day: 1.00   Years: 16.00   Total pack years: 16.00   Types: Cigarettes   Quit date: 05/13/1991   Years since quitting: 30.4  Smokeless Tobacco Never  Tobacco Comments   Quit 30 years ago    Goals Met:  Independence with exercise equipment Exercise tolerated well No report of concerns or symptoms today  Goals Unmet:  Not Applicable  Comments: Pt able to follow exercise prescription today without complaint.  Will continue to monitor for progression.    Dr. Emily Filbert is Medical Director for [place].  Dr. Ottie Glazier is Medical Director for [facility] Pulmonary Rehabilitation.

## 2021-10-30 DIAGNOSIS — Z951 Presence of aortocoronary bypass graft: Secondary | ICD-10-CM

## 2021-10-30 DIAGNOSIS — I214 Non-ST elevation (NSTEMI) myocardial infarction: Secondary | ICD-10-CM

## 2021-10-30 NOTE — Progress Notes (Signed)
Daily Session Note  Patient Details  Name: Antonio [name] MRN: [id number] Date of Birth: 12/02/1960 Referring Provider:   Flowsheet Row Cardiac Rehab from 09/16/2021 in [facility] Cardiac and Pulmonary Rehab  Referring Provider Skains       Encounter Date: 10/30/2021  Check In:  Session Check In - 10/30/21 0949       Check-In   Supervising physician immediately available to respond to emergencies See telemetry face sheet for immediately available ER MD    Location [facility]-Cardiac & Pulmonary Rehab    Staff Present Carson Myrtle, BS, RRT, CPFT;Kristen Coble, RN,BC,MSN;Davielle Lingelbach Rosalia Hammers, MPA, RN    Virtual Visit No    Medication changes reported     No    Fall or balance concerns reported    No    Warm-up and Cool-down Performed on first and last piece of equipment    Resistance Training Performed Yes    VAD Patient? No    PAD/SET Patient? No      Pain Assessment   Currently in Pain? No/denies                Social History   Tobacco Use  Smoking Status Former   Packs/day: 1.00   Years: 16.00   Total pack years: 16.00   Types: Cigarettes   Quit date: 05/13/1991   Years since quitting: 30.4  Smokeless Tobacco Never  Tobacco Comments   Quit 30 years ago    Goals Met:  Independence with exercise equipment Exercise tolerated well No report of concerns or symptoms today Strength training completed today  Goals Unmet:  Not Applicable  Comments: Pt able to follow exercise prescription today without complaint.  Will continue to monitor for progression.    Dr. Emily Filbert is Medical Director for [place].  Dr. Ottie Glazier is Medical Director for [facility] Pulmonary Rehabilitation.

## 2021-11-01 ENCOUNTER — Encounter: Payer: Self-pay | Admitting: *Deleted

## 2021-11-01 DIAGNOSIS — Z951 Presence of aortocoronary bypass graft: Secondary | ICD-10-CM

## 2021-11-01 DIAGNOSIS — I214 Non-ST elevation (NSTEMI) myocardial infarction: Secondary | ICD-10-CM

## 2021-11-04 ENCOUNTER — Encounter: Payer: Self-pay | Admitting: *Deleted

## 2021-11-04 DIAGNOSIS — Z951 Presence of aortocoronary bypass graft: Secondary | ICD-10-CM

## 2021-11-04 DIAGNOSIS — I214 Non-ST elevation (NSTEMI) myocardial infarction: Secondary | ICD-10-CM

## 2021-11-06 DIAGNOSIS — I214 Non-ST elevation (NSTEMI) myocardial infarction: Secondary | ICD-10-CM

## 2021-11-06 DIAGNOSIS — Z951 Presence of aortocoronary bypass graft: Secondary | ICD-10-CM

## 2021-11-06 NOTE — Progress Notes (Signed)
Daily Session Note  Patient Details  Name: Antonio [name] MRN: [id number] Date of Birth: 1960-05-17 Referring Provider:   Flowsheet Row Cardiac Rehab from 09/16/2021 in [facility] Cardiac and Pulmonary Rehab  Referring Provider Skains       Encounter Date: 11/06/2021  Check In:  Session Check In - 11/06/21 1003       Check-In   Supervising physician immediately available to respond to emergencies See telemetry face sheet for immediately available ER MD    Location [facility]-Cardiac & Pulmonary Rehab    Staff Present Carson Myrtle, BS, RRT, CPFT;Hayde Kilgour Amedeo Plenty, BS, ACSM CEP, Exercise Physiologist;Joseph Tessie Fass, Fabio Neighbors, MPA, RN    Virtual Visit No    Medication changes reported     No    Fall or balance concerns reported    No    Warm-up and Cool-down Performed on first and last piece of equipment    Resistance Training Performed Yes    VAD Patient? No    PAD/SET Patient? No      Pain Assessment   Currently in Pain? No/denies                Social History   Tobacco Use  Smoking Status Former   Packs/day: 1.00   Years: 16.00   Total pack years: 16.00   Types: Cigarettes   Quit date: 05/13/1991   Years since quitting: 30.5  Smokeless Tobacco Never  Tobacco Comments   Quit 30 years ago    Goals Met:  Independence with exercise equipment Exercise tolerated well No report of concerns or symptoms today Strength training completed today  Goals Unmet:  Not Applicable  Comments: Pt able to follow exercise prescription today without complaint.  Will continue to monitor for progression.    Dr. Emily Filbert is Medical Director for [place].  Dr. Ottie Glazier is Medical Director for [facility] Pulmonary Rehabilitation.

## 2021-11-08 ENCOUNTER — Encounter: Payer: Self-pay | Admitting: *Deleted

## 2021-11-08 DIAGNOSIS — I214 Non-ST elevation (NSTEMI) myocardial infarction: Secondary | ICD-10-CM

## 2021-11-08 DIAGNOSIS — Z951 Presence of aortocoronary bypass graft: Secondary | ICD-10-CM

## 2021-11-08 NOTE — Progress Notes (Signed)
Daily Session Note  Patient Details  Name: Antonio [name] MRN: [id number] Date of Birth: 22-Jan-1961 Referring Provider:   Flowsheet Row Cardiac Rehab from 09/16/2021 in [facility] Cardiac and Pulmonary Rehab  Referring Provider Skains       Encounter Date: 11/08/2021  Check In:  Session Check In - 11/08/21 0948       Check-In   Supervising physician immediately available to respond to emergencies See telemetry face sheet for immediately available ER MD    Location [facility]-Cardiac & Pulmonary Rehab    Staff Present Heath Lark, RN, BSN, CCRP;Jessica Harris, MA, RCEP, CCRP, CCET;Joseph [place], [place]    Virtual Visit No    Medication changes reported     Yes    Fall or balance concerns reported    No    Warm-up and Cool-down Performed on first and last piece of equipment    Resistance Training Performed Yes    VAD Patient? No    PAD/SET Patient? No      Pain Assessment   Currently in Pain? No/denies                Social History   Tobacco Use  Smoking Status Former   Packs/day: 1.00   Years: 16.00   Total pack years: 16.00   Types: Cigarettes   Quit date: 05/13/1991   Years since quitting: 30.5  Smokeless Tobacco Never  Tobacco Comments   Quit 30 years ago    Goals Met:  Independence with exercise equipment Exercise tolerated well No report of concerns or symptoms today  Goals Unmet:  Not Applicable  Comments: Pt able to follow exercise prescription today without complaint.  Will continue to monitor for progression.    Dr. Emily Filbert is Medical Director for [place].  Dr. Ottie Glazier is Medical Director for [facility] Pulmonary Rehabilitation.

## 2021-11-13 ENCOUNTER — Ambulatory Visit (INDEPENDENT_AMBULATORY_CARE_PROVIDER_SITE_OTHER): Payer: Self-pay | Admitting: Dermatology

## 2021-11-13 DIAGNOSIS — L820 Inflamed seborrheic keratosis: Secondary | ICD-10-CM

## 2021-11-13 DIAGNOSIS — L570 Actinic keratosis: Secondary | ICD-10-CM

## 2021-11-13 DIAGNOSIS — L821 Other seborrheic keratosis: Secondary | ICD-10-CM

## 2021-11-13 DIAGNOSIS — L578 Other skin changes due to chronic exposure to nonionizing radiation: Secondary | ICD-10-CM

## 2021-11-13 NOTE — Patient Instructions (Addendum)
Cryotherapy Aftercare  Wash gently with soap and water everyday.   Apply Vaseline and Band-Aid daily until healed.    Recommend daily broad spectrum sunscreen SPF 30+ to sun-exposed areas, reapply every 2 hours as needed. Call for new or changing lesions.  Staying in the shade or wearing long sleeves, sun glasses (UVA+UVB protection) and wide brim hats (4-inch brim around the entire circumference of the hat) are also recommended for sun protection.    Due to recent changes in healthcare laws, you may see results of your pathology and/or laboratory studies on MyChart before the doctors have had a chance to review them. We understand that in some cases there may be results that are confusing or concerning to you. Please understand that not all results are received at the same time and often the doctors may need to interpret multiple results in order to provide you with the best plan of care or course of treatment. Therefore, we ask that you please give us 2 business days to thoroughly review all your results before contacting the office for clarification. Should we see a critical lab result, you will be contacted sooner.   If You Need Anything After Your Visit  If you have any questions or concerns for your doctor, please call our main line at [phone] and press option 4 to reach your doctor's medical assistant. If no one answers, please leave a voicemail as directed and we will return your call as soon as possible. Messages left after 4 pm will be answered the following business day.   You may also send us a message via MyChart. We typically respond to MyChart messages within 1-2 business days.  For prescription refills, please ask your pharmacy to contact our office. Our fax number is [phone].  If you have an urgent issue when the clinic is closed that cannot wait until the next business day, you can page your doctor at the number below.    Please note that while we do our best to be  available for urgent issues outside of office hours, we are not available 24/7.   If you have an urgent issue and are unable to reach us, you may choose to seek medical care at your doctor's office, retail clinic, urgent care center, or emergency room.  If you have a medical emergency, please immediately call 911 or go to the emergency department.  Pager Numbers  - Dr. Kowalski: [phone]  - Dr. Moye: [phone]  - Dr. Stewart: [phone]  In the event of inclement weather, please call our main line at [phone] for an update on the status of any delays or closures.  Dermatology Medication Tips: Please keep the boxes that topical medications come in in order to help keep track of the instructions about where and how to use these. Pharmacies typically print the medication instructions only on the boxes and not directly on the medication tubes.   If your medication is too expensive, please contact our office at [phone] option 4 or send us a message through MyChart.   We are unable to tell what your co-pay for medications will be in advance as this is different depending on your insurance coverage. However, we may be able to find a substitute medication at lower cost or fill out paperwork to get insurance to cover a needed medication.   If a prior authorization is required to get your medication covered by your insurance company, please allow us 1-2 business days to complete this process.    Drug prices often vary depending on where the prescription is filled and some pharmacies may offer cheaper prices.  The website www.goodrx.com contains coupons for medications through different pharmacies. The prices here do not account for what the cost may be with help from insurance (it may be cheaper with your insurance), but the website can give you the price if you did not use any insurance.  - You can print the associated coupon and take it with your prescription to the pharmacy.  -  You may also stop by our office during regular business hours and pick up a GoodRx coupon card.  - If you need your prescription sent electronically to a different pharmacy, notify our office through [facility] MyChart or by phone at [phone] option 4.     Si Usted Necesita Algo Despus de Su Visita  Tambin puede enviarnos un mensaje a travs de MyChart. Por lo general respondemos a los mensajes de MyChart en el transcurso de 1 a 2 das hbiles.  Para renovar recetas, por favor pida a su farmacia que se ponga en contacto con nuestra oficina. Nuestro nmero de fax es el [phone].  Si tiene un asunto urgente cuando la clnica est cerrada y que no puede esperar hasta el siguiente da hbil, puede llamar/localizar a su doctor(a) al nmero que aparece a continuacin.   Por favor, tenga en cuenta que aunque hacemos todo lo posible para estar disponibles para asuntos urgentes fuera del horario de oficina, no estamos disponibles las 24 horas del da, los 7 das de la semana.   Si tiene un problema urgente y no puede comunicarse con nosotros, puede optar por buscar atencin mdica  en el consultorio de su doctor(a), en una clnica privada, en un centro de atencin urgente o en una sala de emergencias.  Si tiene una emergencia mdica, por favor llame inmediatamente al 911 o vaya a la sala de emergencias.  Nmeros de bper  - Dr. Kowalski: [phone]  - Dra. Moye: [phone]  - Dra. Stewart: [phone]  En caso de inclemencias del tiempo, por favor llame a nuestra lnea principal al [phone] para una actualizacin sobre el estado de cualquier retraso o cierre.  Consejos para la medicacin en dermatologa: Por favor, guarde las cajas en las que vienen los medicamentos de uso tpico para ayudarle a seguir las instrucciones sobre dnde y cmo usarlos. Las farmacias generalmente imprimen las instrucciones del medicamento slo en las cajas y no directamente en los tubos del  medicamento.   Si su medicamento es muy caro, por favor, pngase en contacto con nuestra oficina llamando al [phone] y presione la opcin 4 o envenos un mensaje a travs de MyChart.   No podemos decirle cul ser su copago por los medicamentos por adelantado ya que esto es diferente dependiendo de la cobertura de su seguro. Sin embargo, es posible que podamos encontrar un medicamento sustituto a menor costo o llenar un formulario para que el seguro cubra el medicamento que se considera necesario.   Si se requiere una autorizacin previa para que su compaa de seguros cubra su medicamento, por favor permtanos de 1 a 2 das hbiles para completar este proceso.  Los precios de los medicamentos varan con frecuencia dependiendo del lugar de dnde se surte la receta y alguna farmacias pueden ofrecer precios ms baratos.  El sitio web www.goodrx.com tiene cupones para medicamentos de diferentes farmacias. Los precios aqu no tienen en cuenta lo que podra costar con la ayuda del seguro (puede ser ms   barato con su seguro), pero el sitio web puede darle el precio si no utiliz ningn seguro.  - Puede imprimir el cupn correspondiente y llevarlo con su receta a la farmacia.  - Tambin puede pasar por nuestra oficina durante el horario de atencin regular y recoger una tarjeta de cupones de GoodRx.  - Si necesita que su receta se enve electrnicamente a una farmacia diferente, informe a nuestra oficina a travs de MyChart de [facility] o por telfono llamando al [phone] y presione la opcin 4.  

## 2021-11-13 NOTE — Progress Notes (Signed)
   Follow-Up Visit   Subjective  Antonio [name] is a 61 y.o. male who presents for the following: check spots (R shoulder, 73m itchy prn/R and L temples ~428mscaly).  The patient has spots, moles and lesions to be evaluated, some may be new or changing and the patient has concerns that these could be cancer.   The following portions of the chart were reviewed this encounter and updated as appropriate:       Review of Systems:  No other skin or systemic complaints except as noted in HPI or Assessment and Plan.  Objective  Well appearing patient in no apparent distress; mood and affect are within normal limits.  A focused examination was performed including face, right shoulder. Relevant physical exam findings are noted in the Assessment and Plan.  R ant shoulder x 1 Pink scaly patch  L temple x 2, L eyebrow x 1, L zygoma x 1, R forehead x 1 (5) Pink scaly macules    Assessment & Plan   Seborrheic Keratoses - Stuck-on, waxy, tan-brown papules and/or plaques  - Benign-appearing - Discussed benign etiology and prognosis. - Observe - Call for any changes  Actinic Damage - chronic, secondary to cumulative UV radiation exposure/sun exposure over time - diffuse scaly erythematous macules with underlying dyspigmentation - Recommend daily broad spectrum sunscreen SPF 30+ to sun-exposed areas, reapply every 2 hours as needed.  - Recommend staying in the shade or wearing long sleeves, sun glasses (UVA+UVB protection) and wide brim hats (4-inch brim around the entire circumference of the hat). - Call for new or changing lesions.   Inflamed seborrheic keratosis R ant shoulder x 1  vrs dermatitis  Symptomatic, irritating, patient would like treated.   Destruction of lesion - R ant shoulder x 1  Destruction method: cryotherapy   Informed consent: discussed and consent obtained   Lesion destroyed using liquid nitrogen: Yes   Region frozen until ice ball extended beyond lesion:  Yes   Outcome: patient tolerated procedure well with no complications   Post-procedure details: wound care instructions given   Additional details:  Prior to procedure, discussed risks of blister formation, small wound, skin dyspigmentation, or rare scar following cryotherapy. Recommend Vaseline ointment to treated areas while healing.   AK (actinic keratosis) (5) L temple x 2, L eyebrow x 1, L zygoma x 1, R forehead x 1  Destruction of lesion - L temple x 2, L eyebrow x 1, L zygoma x 1, R forehead x 1  Destruction method: cryotherapy   Informed consent: discussed and consent obtained   Lesion destroyed using liquid nitrogen: Yes   Region frozen until ice ball extended beyond lesion: Yes   Outcome: patient tolerated procedure well with no complications   Post-procedure details: wound care instructions given   Additional details:  Prior to procedure, discussed risks of blister formation, small wound, skin dyspigmentation, or rare scar following cryotherapy. Recommend Vaseline ointment to treated areas while healing.    Return in about 6 months (around 05/16/2022) for UBSE, AK f/u, Hx of BCC.  I, SoOthelia PullingRMA, am acting as scribe for TaBrendolyn PattyMD .  Documentation: I have reviewed the above documentation for accuracy and completeness, and I agree with the above.  TaBrendolyn PattyD

## 2021-11-15 ENCOUNTER — Encounter: Payer: Self-pay | Attending: Cardiology | Admitting: *Deleted

## 2021-11-15 DIAGNOSIS — I214 Non-ST elevation (NSTEMI) myocardial infarction: Secondary | ICD-10-CM

## 2021-11-15 DIAGNOSIS — Z951 Presence of aortocoronary bypass graft: Secondary | ICD-10-CM

## 2021-11-15 NOTE — Progress Notes (Signed)
Daily Session Note  Patient Details  Name: Antonio [name] MRN: [id number] Date of Birth: [birth year] Referring Provider:   Flowsheet Row Cardiac Rehab from 09/16/2021 in [facility] Cardiac and Pulmonary Rehab  Referring Provider Skains       Encounter Date: 11/15/2021  Check In:  Session Check In - 11/15/21 0959       Check-In   Supervising physician immediately available to respond to emergencies See telemetry face sheet for immediately available ER MD    Location [facility]-Cardiac & Pulmonary Rehab    Staff Present Heath Lark, RN, BSN, CCRP;Jessica Pinnacle, MA, RCEP, CCRP, CCET;Joseph Zebulon, [place]    Virtual Visit No    Medication changes reported     No    Fall or balance concerns reported    No    Warm-up and Cool-down Performed on first and last piece of equipment    Resistance Training Performed Yes    VAD Patient? No    PAD/SET Patient? No      Pain Assessment   Currently in Pain? No/denies                Social History   Tobacco Use  Smoking Status Former   Packs/day: 1.00   Years: 16.00   Total pack years: 16.00   Types: Cigarettes   Quit date: 05/13/1991   Years since quitting: 30.5  Smokeless Tobacco Never  Tobacco Comments   Quit 30 years ago    Goals Met:  Independence with exercise equipment Exercise tolerated well No report of concerns or symptoms today  Goals Unmet:  Not Applicable  Comments: Pt able to follow exercise prescription today without complaint.  Will continue to monitor for progression.    Dr. Emily Filbert is Medical Director for [place].  Dr. Ottie Glazier is Medical Director for [facility] Pulmonary Rehabilitation.

## 2021-11-18 ENCOUNTER — Encounter: Payer: Self-pay | Admitting: *Deleted

## 2021-11-18 DIAGNOSIS — I214 Non-ST elevation (NSTEMI) myocardial infarction: Secondary | ICD-10-CM

## 2021-11-18 DIAGNOSIS — Z951 Presence of aortocoronary bypass graft: Secondary | ICD-10-CM

## 2021-11-18 NOTE — Progress Notes (Signed)
Daily Session Note  Patient Details  Name: Antonio [name] MRN: [id number] Date of Birth: 03-Nov-1960 Referring Provider:   Flowsheet Row Cardiac Rehab from 09/16/2021 in [facility] Cardiac and Pulmonary Rehab  Referring Provider Skains       Encounter Date: 11/18/2021  Check In:  Session Check In - 11/18/21 0934       Check-In   Supervising physician immediately available to respond to emergencies See telemetry face sheet for immediately available ER MD    Location [facility]-Cardiac & Pulmonary Rehab    Staff Present Heath Lark, RN, BSN, CCRP;Joseph [place], RCP,RRT,BSRT;Kelly [place], [place], ACSM CEP, Exercise Physiologist    Virtual Visit No    Medication changes reported     No    Fall or balance concerns reported    No    Warm-up and Cool-down Performed on first and last piece of equipment    Resistance Training Performed Yes    VAD Patient? No    PAD/SET Patient? No      Pain Assessment   Currently in Pain? No/denies                Social History   Tobacco Use  Smoking Status Former   Packs/day: 1.00   Years: 16.00   Total pack years: 16.00   Types: Cigarettes   Quit date: 05/13/1991   Years since quitting: 30.5  Smokeless Tobacco Never  Tobacco Comments   Quit 30 years ago    Goals Met:  Independence with exercise equipment Exercise tolerated well No report of concerns or symptoms today  Goals Unmet:  Not Applicable  Comments: Pt able to follow exercise prescription today without complaint.  Will continue to monitor for progression.    Dr. Emily Filbert is Medical Director for [place].  Dr. Ottie Glazier is Medical Director for [facility] Pulmonary Rehabilitation.

## 2021-11-20 ENCOUNTER — Encounter: Payer: Self-pay | Admitting: *Deleted

## 2021-11-20 DIAGNOSIS — Z951 Presence of aortocoronary bypass graft: Secondary | ICD-10-CM

## 2021-11-20 DIAGNOSIS — I214 Non-ST elevation (NSTEMI) myocardial infarction: Secondary | ICD-10-CM

## 2021-11-20 NOTE — Progress Notes (Signed)
Cardiac Individual Treatment Plan  Patient Details  Name: Antonio [name] MRN: [id number] Date of Birth: 05/06/1961 Referring Provider:   Flowsheet Row Cardiac Rehab from 09/16/2021 in [facility] Cardiac and Pulmonary Rehab  Referring Provider Skains       Initial Encounter Date:  Flowsheet Row Cardiac Rehab from 09/16/2021 in [facility] Cardiac and Pulmonary Rehab  Date 09/16/21       Visit Diagnosis: NSTEMI (non-ST elevation myocardial infarction) ([place])  S/P CABG x 4  Patient's Home Medications on Admission:  Current Outpatient Medications:    acetaminophen (TYLENOL) 500 MG tablet, Take 2 tablets (1,000 mg total) by mouth every 6 (six) hours as needed for moderate pain., Disp: 30 tablet, Rfl: 0   amLODipine (NORVASC) 2.5 MG tablet, Take 1 tablet (2.5 mg total) by mouth daily., Disp: 30 tablet, Rfl: 2   Ascorbic Acid (VITAMIN C) 1000 MG tablet, Take 1,000 mg by mouth daily., Disp: , Rfl:    aspirin EC 81 MG tablet, Take 4 tablets (324 mg total) by mouth daily as needed (chest discomfort). Swallow whole., Disp: 30 tablet, Rfl: 0   aspirin EC 81 MG tablet, Take 81 mg by mouth daily. Swallow whole., Disp: , Rfl:    atorvastatin (LIPITOR) 80 MG tablet, Take 1 tablet (80 mg total) by mouth daily at 6 PM., Disp: 30 tablet, Rfl: 2   cholecalciferol (VITAMIN D3) 25 MCG (1000 UNIT) tablet, Take 1,000 Units by mouth daily., Disp: , Rfl:    clopidogrel (PLAVIX) 75 MG tablet, Take 1 tablet (75 mg total) by mouth daily., Disp: 30 tablet, Rfl: 11   metoprolol tartrate (LOPRESSOR) 25 MG tablet, Take 1 tablet (25 mg total) by mouth 2 (two) times daily., Disp: 60 tablet, Rfl: 2   Multiple Vitamins-Minerals (LIVER DETOX PO), Take 1 tablet by mouth daily., Disp: , Rfl:    SEMAGLUTIDE-WEIGHT MANAGEMENT [place], Inject 25 Units into the skin daily., Disp: , Rfl:    testosterone cypionate (DEPOTESTOSTERONE CYPIONATE) 200 MG/ML injection, , Disp: , Rfl:    vitamin B-12 (CYANOCOBALAMIN) 500 MCG tablet, Take 500 mcg by mouth  daily., Disp: , Rfl:   Past Medical History: Past Medical History:  Diagnosis Date   Basal cell carcinoma 03/23/2018   right upper lip/inf nasolabial   Basal cell carcinoma 11/23/2019   Right Spinal Upper Back, EDC    Tobacco Use: Social History   Tobacco Use  Smoking Status Former   Packs/day: 1.00   Years: 16.00   Total pack years: 16.00   Types: Cigarettes   Quit date: 05/13/1991   Years since quitting: 30.5  Smokeless Tobacco Never  Tobacco Comments   Quit 30 years ago    Labs: Review Flowsheet  More data may exist      Latest Ref Rng & Units 04/21/2006 08/02/2021 08/05/2021 08/06/2021 10/09/2021  Labs for ITP Cardiac and Pulmonary Rehab  Cholestrol 100 - 199 mg/dL 204  192  - - 155   LDL (calc) 0 - 99 mg/dL - 130  - - 88   Direct LDL mg/dL 167.2  - - - -  HDL-C >39 mg/dL 39.5  42  - - 50   Trlycerides 0 - 149 mg/dL 74  102  - - 90   Hemoglobin A1c 4.8 - 5.6 % - 5.6  - - -  PH, Arterial 7.35 - 7.45 - - 7.46  7.347  7.314  7.268  7.235  7.327  7.370  7.374  7.329  7.320  -  PCO2 arterial 32 -  48 mmHg - - 34  42.2  38.8  41.6  53.8  46.2  41.6  42.9  50.1  49.7  -  Bicarbonate 20.0 - 28.0 mmol/L - - 24.2  23.0  19.7  19.2  23.0  24.2  24.0  25.1  24.4  26.4  25.6  -  TCO2 22 - 32 mmol/L - - - _0 -  Acid-base deficit 0.0 - 2.0 mmol/L - - - 2.0  6.0  8.0  5.0  2.0  1.0  2.0  1.0  -  O2 Saturation % - - 97.9  95  95  94  95  100  100  100  85  100  100  -     Exercise Target Goals: Exercise Program Goal: Individual exercise prescription set using results from initial 6 min walk test and THRR while considering  patient's activity barriers and safety.   Exercise Prescription Goal: Initial exercise prescription builds to 30-45 minutes a day of aerobic activity, 2-3 days per week.  Home exercise guidelines will be given to patient during program as part of exercise prescription that the participant will  acknowledge.   Education: Aerobic Exercise: - Group verbal and visual presentation on the components of exercise prescription. Introduces F.I.T.T principle from ACSM for exercise prescriptions.  Reviews F.I.T.T. principles of aerobic exercise including progression. Written material given at graduation. Flowsheet Row Cardiac Rehab from 11/06/2021 in [facility] Cardiac and Pulmonary Rehab  Education need identified 09/16/21       Education: Resistance Exercise: - Group verbal and visual presentation on the components of exercise prescription. Introduces F.I.T.T principle from ACSM for exercise prescriptions  Reviews F.I.T.T. principles of resistance exercise including progression. Written material given at graduation. Flowsheet Row Cardiac Rehab from 11/06/2021 in [facility] Cardiac and Pulmonary Rehab  Date 09/18/21  Educator [facility]  Instruction Review Code 1- Verbalizes Understanding        Education: Exercise & Equipment Safety: - Individual verbal instruction and demonstration of equipment use and safety with use of the equipment. Flowsheet Row Cardiac Rehab from 11/06/2021 in [facility] Cardiac and Pulmonary Rehab  Date 09/16/21  Educator [facility]  Instruction Review Code 1- Verbalizes Understanding       Education: Exercise Physiology & General Exercise Guidelines: - Group verbal and written instruction with models to review the exercise physiology of the cardiovascular system and associated critical values. Provides general exercise guidelines with specific guidelines to those with heart or lung disease.  Flowsheet Row Cardiac Rehab from 11/06/2021 in [facility] Cardiac and Pulmonary Rehab  Date 11/06/21  Educator NT  Instruction Review Code 1- [employer] Understanding       Education: Flexibility, Balance, Mind/Body Relaxation: - Group verbal and visual presentation with interactive activity on the components of exercise prescription. Introduces F.I.T.T principle from ACSM for exercise prescriptions.  Reviews F.I.T.T. principles of flexibility and balance exercise training including progression. Also discusses the mind body connection.  Reviews various relaxation techniques to help reduce and manage stress (i.e. Deep breathing, progressive muscle relaxation, and visualization). Balance handout provided to take home. Written material given at graduation. Flowsheet Row Cardiac Rehab from 11/06/2021 in [facility] Cardiac and Pulmonary Rehab  Date 09/25/21  Educator [facility]  Instruction Review Code 1- Verbalizes Understanding       Activity Barriers & Risk Stratification:  Activity Barriers & Cardiac Risk  Stratification - 09/11/21 1019       Activity Barriers & Cardiac Risk Stratification   Activity Barriers None    Cardiac Risk Stratification High             6 Minute Walk:  6 Minute Walk     Row Name 09/16/21 1714         6 Minute Walk   Phase Initial     Distance 1760 feet     Walk Time 6 minutes     # of Rest Breaks 0     MPH 3.33     METS 4.2     RPE 12     Perceived Dyspnea  0     VO2 Peak 14.69     Symptoms No     Resting HR 71 bpm     Resting BP 104/70     Resting Oxygen Saturation  96 %     Exercise Oxygen Saturation  during 6 min walk 96 %     Max Ex. HR 82 bpm     Max Ex. BP 134/70     2 Minute Post BP 124/72              Oxygen Initial Assessment:   Oxygen Re-Evaluation:   Oxygen Discharge (Final Oxygen Re-Evaluation):   Initial Exercise Prescription:  Initial Exercise Prescription - 09/17/21 0700       Date of Initial Exercise RX and Referring Provider   Date 09/16/21    Referring Provider Skains      Oxygen   Maintain Oxygen Saturation 88% or higher      Treadmill   MPH 3.5    Grade 1    Minutes 15    METs 4.16      Recumbant Elliptical   Level 2    RPM 50    Minutes 15    METs 4.2      Elliptical   Level 1    Speed 3    Minutes 15    METs 4.2      REL-XR   Level 3    Speed 50    Minutes 15    METs 4.2       Prescription Details   Frequency (times per week) 3      Intensity   THRR 40-80% of Max Heartrate 105-140    Ratings of Perceived Exertion 11-13    Perceived Dyspnea 0-4      Progression   Progression Continue to progress workloads to maintain intensity without signs/symptoms of physical distress.      Resistance Training   Training Prescription Yes    Weight 5    Reps 10-15             Perform Capillary Blood Glucose checks as needed.  Exercise Prescription Changes:   Exercise Prescription Changes     Row Name 09/16/21 1700 09/23/21 1600 09/27/21 1000 10/09/21 1500 10/22/21 0800     Response to Exercise   Blood Pressure (Admit) 104/70 126/80 -- 126/70 126/64   Blood Pressure (Exercise) 134/70 124/62 -- 148/70 180/80   Blood Pressure (Exit) 124/72 98/56 -- 100/60 110/60   Heart Rate (Admit) 71 bpm 83 bpm -- 85 bpm 75 bpm   Heart Rate (Exercise) 118 bpm 128 bpm -- 147 bpm 140 bpm   Heart Rate (Exit) 82 bpm 95 bpm -- 100 bpm 106 bpm   Oxygen Saturation (Admit) 96 % -- -- -- --   Oxygen Saturation (  Exercise) 96 % -- -- -- --   Oxygen Saturation (Exit) 96 % -- -- -- --   Rating of Perceived Exertion (Exercise) 12 13 -- 15 13   Perceived Dyspnea (Exercise) 0 -- -- -- --   Symptoms none none -- none none   Comments 6 MWT results third full day of exercise -- -- --   Duration -- Continue with 30 min of aerobic exercise without signs/symptoms of physical distress. -- Continue with 30 min of aerobic exercise without signs/symptoms of physical distress. Continue with 30 min of aerobic exercise without signs/symptoms of physical distress.   Intensity -- THRR unchanged -- THRR unchanged THRR unchanged     Progression   Progression -- Continue to progress workloads to maintain intensity without signs/symptoms of physical distress. -- Continue to progress workloads to maintain intensity without signs/symptoms of physical distress. Continue to progress workloads to maintain intensity  without signs/symptoms of physical distress.   Average METs -- 4.41 -- 5.4 5.84     Resistance Training   Training Prescription -- Yes -- Yes Yes   Weight -- 5 lb -- 5 lb 5 lb   Reps -- 10-15 -- 10-15 10-15     Interval Training   Interval Training -- No -- No No     Treadmill   MPH -- 3.5 -- 3.9 4   Grade -- 2 -- 2.5 3   Minutes -- 15 -- 15 15   METs -- 4.65 -- 5.33 5.72     Recumbant Elliptical   Level -- 4 -- -- --   Minutes -- 15 -- -- --   METs -- 5.5 -- -- --     Elliptical   Level -- -- -- 7 8   Speed -- -- -- -- 3   Minutes -- -- -- 15 15   METs -- -- -- 6.6 6.5     REL-XR   Level -- 8 -- 8 8   Minutes -- 15 -- 15 15   METs -- 3.1 -- 5.3 5.3     Home Exercise Plan   Plans to continue exercise at -- -- Home (comment)  walking, home gym Home (comment)  walking, home gym Home (comment)  walking, home gym   Frequency -- -- Add 2 additional days to program exercise sessions. Add 2 additional days to program exercise sessions. Add 2 additional days to program exercise sessions.   Initial Home Exercises Provided -- -- 09/27/21 09/27/21 09/27/21     Oxygen   Maintain Oxygen Saturation -- 88% or higher -- 88% or higher 88% or higher    Row Name 11/04/21 1000 11/18/21 1400           Response to Exercise   Blood Pressure (Admit) 130/76 124/62      Blood Pressure (Exit) 124/78 94/58      Heart Rate (Admit) 78 bpm 84 bpm      Heart Rate (Exercise) 149 bpm 137 bpm      Heart Rate (Exit) 89 bpm 116 bpm      Oxygen Saturation (Admit) -- 98 %      Oxygen Saturation (Exercise) -- 96 %      Oxygen Saturation (Exit) -- 96 %      Rating of Perceived Exertion (Exercise) 15 14      Symptoms none none      Duration Continue with 30 min of aerobic exercise without signs/symptoms of physical distress. Continue with 30 min of aerobic exercise  without signs/symptoms of physical distress.      Intensity THRR unchanged THRR unchanged        Progression   Progression Continue to  progress workloads to maintain intensity without signs/symptoms of physical distress. Continue to progress workloads to maintain intensity without signs/symptoms of physical distress.      Average METs 5.17 8.35        Resistance Training   Training Prescription Yes Yes      Weight 5 lb 5 lb      Reps 10-15 10-15        Interval Training   Interval Training No No        Treadmill   MPH 4 4      Grade 3 9      Minutes 15 15      METs 5.72 9.1  interval running        Elliptical   Level 9 10      Speed 3 3.9      Minutes 15 15      METs -- 7.6        REL-XR   Level 8 --      Minutes 15 --      METs 5.2 --        Home Exercise Plan   Plans to continue exercise at Home (comment)  walking, home gym Home (comment)  walking, home gym      Frequency Add 2 additional days to program exercise sessions. Add 2 additional days to program exercise sessions.      Initial Home Exercises Provided 09/27/21 09/27/21        Oxygen   Maintain Oxygen Saturation 88% or higher 88% or higher               Exercise Comments:   Exercise Comments     Row Name 09/18/21 1002           Exercise Comments First full day of exercise!  Patient was oriented to gym and equipment including functions, settings, policies, and procedures.  Patient's individual exercise prescription and treatment plan were reviewed.  All starting workloads were established based on the results of the 6 minute walk test done at initial orientation visit.  The plan for exercise progression was also introduced and progression will be customized based on patient's performance and goals.                Exercise Goals and Review:   Exercise Goals     Row Name 09/16/21 1720             Exercise Goals   Increase Physical Activity Yes       Intervention Provide advice, education, support and counseling about physical activity/exercise needs.;Develop an individualized exercise prescription for aerobic and  resistive training based on initial evaluation findings, risk stratification, comorbidities and participant's personal goals.       Expected Outcomes Short Term: Attend rehab on a regular basis to increase amount of physical activity.;Long Term: Add in home exercise to make exercise part of routine and to increase amount of physical activity.;Long Term: Exercising regularly at least 3-5 days a week.       Increase Strength and Stamina Yes       Intervention Provide advice, education, support and counseling about physical activity/exercise needs.;Develop an individualized exercise prescription for aerobic and resistive training based on initial evaluation findings, risk stratification, comorbidities and participant's personal goals.       Expected  Outcomes Short Term: Increase workloads from initial exercise prescription for resistance, speed, and METs.;Short Term: Perform resistance training exercises routinely during rehab and add in resistance training at home;Long Term: Improve cardiorespiratory fitness, muscular endurance and strength as measured by increased METs and functional capacity (6MWT)       Able to understand and use rate of perceived exertion (RPE) scale Yes       Intervention Provide education and explanation on how to use RPE scale       Expected Outcomes Short Term: Able to use RPE daily in rehab to express subjective intensity level;Long Term:  Able to use RPE to guide intensity level when exercising independently       Able to understand and use Dyspnea scale Yes       Intervention Provide education and explanation on how to use Dyspnea scale       Expected Outcomes Short Term: Able to use Dyspnea scale daily in rehab to express subjective sense of shortness of breath during exertion;Long Term: Able to use Dyspnea scale to guide intensity level when exercising independently       Knowledge and understanding of Target Heart Rate Range (THRR) Yes       Intervention Provide education and  explanation of THRR including how the numbers were predicted and where they are located for reference       Expected Outcomes Short Term: Able to state/look up THRR;Long Term: Able to use THRR to govern intensity when exercising independently;Short Term: Able to use daily as guideline for intensity in rehab       Able to check pulse independently Yes       Intervention Provide education and demonstration on how to check pulse in carotid and radial arteries.;Review the importance of being able to check your own pulse for safety during independent exercise       Expected Outcomes Short Term: Able to explain why pulse checking is important during independent exercise;Long Term: Able to check pulse independently and accurately       Understanding of Exercise Prescription Yes       Intervention Provide education, explanation, and written materials on patient's individual exercise prescription       Expected Outcomes Short Term: Able to explain program exercise prescription;Long Term: Able to explain home exercise prescription to exercise independently                Exercise Goals Re-Evaluation :  Exercise Goals Re-Evaluation     Row Name 09/18/21 1002 09/23/21 1614 09/27/21 1016 10/10/21 0705 10/22/21 0837     Exercise Goal Re-Evaluation   Exercise Goals Review Increase Physical Activity;Able to understand and use rate of perceived exertion (RPE) scale;Knowledge and understanding of Target Heart Rate Range (THRR);Understanding of Exercise Prescription;Able to understand and use Dyspnea scale;Able to check pulse independently;Increase Strength and Stamina Increase Physical Activity;Increase Strength and Stamina;Understanding of Exercise Prescription Increase Physical Activity;Increase Strength and Stamina;Understanding of Exercise Prescription;Able to understand and use rate of perceived exertion (RPE) scale;Able to understand and use Dyspnea scale;Knowledge and understanding of Target Heart Rate  Range (THRR);Able to check pulse independently Increase Physical Activity;Increase Strength and Stamina;Understanding of Exercise Prescription Increase Physical Activity;Increase Strength and Stamina;Understanding of Exercise Prescription   Comments Reviewed RPE and dyspnea scales, THR and program prescription with pt today.  Pt voiced understanding and was given a copy of goals to take home. Shivam is off to a good start in rehab.  He has completed his first three full days  in rehab.  He is already up to level 8 on the XR.  We will continue to monitor his progress. Reviewed home exercise with pt today.  Pt plans to walk his land at home for exercise.   He also has weights at home and has a friend that has a home gym he is able to use as well. Reviewed THR, pulse, RPE, sign and symptoms, pulse oximetery and when to call 911 or MD.  Also discussed weather considerations and indoor options.  Pt voiced understanding. Hisashi has been doing well in rehab. He increased his average MET level to 5.40 METs. He also increased his speed on the treadmill to 3.9 mph and his grade to 2.5%. He has also consistently hit his THR zone during exercise. He tolerated using 5 lb weights for resistance training. We will continue to monitor Taysean's progress in the program. Amauris continues to do well in rehab.  He has missed a couple of visits with vacation and work.  He tried level 8 on the ellipitcal but has to reduce back to level 6.  We will continue to monitor his progress.   Expected Outcomes Short: Use RPE daily to regulate intensity. Long: Follow program prescription in THR. Short: Continue to attend rehab regularly Long: Continue to follow program prescription Short: Start to add in more exercise at home Long: Continue to exercise independently Short: Increase weight for resistance training. Long: Continue to improve strength and stamina. Short: Return to regular attendance in rehab. Long: Continue to improve stamina    Row Name  11/04/21 1004 11/04/21 1010 11/18/21 1455         Exercise Goal Re-Evaluation   Exercise Goals Review Increase Physical Activity;Increase Strength and Stamina;Understanding of Exercise Prescription Increase Physical Activity;Increase Strength and Stamina;Understanding of Exercise Prescription Increase Physical Activity;Increase Strength and Stamina;Understanding of Exercise Prescription     Comments Mays is doing well in rehab. He recently improved to level 9 on the elliptical. He is back up to 4 mph on the treadmill as well. He has also tolerated 5 lb hand weights for resistance training. We will continue to monitor Jeriko's progress in the program. Dalin reports being physically active, but does not currently do any structured exercise outside of rehab. Reviewed the importance of structured exercise and encouraged him to add at least 2 days of this when not at rehab. He reports there is a senior center near his house that he might be able to use for free. Freeland is doing well in rehab.  He is p to level 10 on the elliptical with intervals.  He is also starting to do some running intervals on the treadmill as well.  We will continue to montior his progress.     Expected Outcomes Short: continue to attend rehab regularly. Long: Continue to improve strength and stamina. Short: begin structured exercise outside of rehab at least 2x/week Long: Continue to improve strength and stamina. Short: Try 6 lb weights and continue to use intervals Long: Continue to improve stamina              Discharge Exercise Prescription (Final Exercise Prescription Changes):  Exercise Prescription Changes - 11/18/21 1400       Response to Exercise   Blood Pressure (Admit) 124/62    Blood Pressure (Exit) 94/58    Heart Rate (Admit) 84 bpm    Heart Rate (Exercise) 137 bpm    Heart Rate (Exit) 116 bpm    Oxygen Saturation (Admit) 98 %  Oxygen Saturation (Exercise) 96 %    Oxygen Saturation (Exit) 96 %    Rating of  Perceived Exertion (Exercise) 14    Symptoms none    Duration Continue with 30 min of aerobic exercise without signs/symptoms of physical distress.    Intensity THRR unchanged      Progression   Progression Continue to progress workloads to maintain intensity without signs/symptoms of physical distress.    Average METs 8.35      Resistance Training   Training Prescription Yes    Weight 5 lb    Reps 10-15      Interval Training   Interval Training No      Treadmill   MPH 4    Grade 9    Minutes 15    METs 9.1   interval running     Elliptical   Level 10    Speed 3.9    Minutes 15    METs 7.6      Home Exercise Plan   Plans to continue exercise at Home (comment)   walking, home gym   Frequency Add 2 additional days to program exercise sessions.    Initial Home Exercises Provided 09/27/21      Oxygen   Maintain Oxygen Saturation 88% or higher             Nutrition:  Target Goals: Understanding of nutrition guidelines, daily intake of sodium <1519m, cholesterol <2070m calories 30% from fat and 7% or less from saturated fats, daily to have 5 or more servings of fruits and vegetables.  Education: All About Nutrition: -Group instruction provided by verbal, written material, interactive activities, discussions, models, and posters to present general guidelines for heart healthy nutrition including fat, fiber, MyPlate, the role of sodium in heart healthy nutrition, utilization of the nutrition label, and utilization of this knowledge for meal planning. Follow up email sent as well. Written material given at graduation. [facility]  Education need identified 09/16/21  Date 10/02/21  Educator MCWheelingInstruction Review Code 1- Verbalizes Understanding       Biometrics:  Pre Biometrics - 09/16/21 1721       Pre Biometrics   Height _0  (1.753 m)    Weight 205 lb 4.8 oz (93.1 kg)    BMI (Calculated)  30.3    Single Leg Stand 30 seconds              Nutrition Therapy Plan and Nutrition Goals:  Nutrition Therapy & Goals - 09/30/21 1114       Nutrition Therapy   Diet Heart healthy, low Na    Protein (specify units) 75g    Fiber 30 grams    Whole Grain Foods 3 servings    Saturated Fats 16 max. grams    Fruits and Vegetables 8 servings/day    Sodium 2 grams      Personal Nutrition Goals   Nutrition Goal ST: review handouts, brainstorm more last minute meals, take note of the amount of salt eaten during the day LT: limit Na <2g/day, become prepared for last minute meals    Comments [age].o. M admitted to rehab s/p NSTEMI. PMHx includes past tobacco use, HLD, right lower lobe nodule, basal cell cancer. PSHx CABGx4 (2023). Relevant medications includes vit C, lipitor, vit D3, MVI (liver detox PO- unspecified name), testosterone cypionate. PYP Score: 66. Vegetables & Fruits 7/12. Breads, Grains & Cereals 7/12. Red & Processed Meat  10/12. Poultry 2/2. Fish & Shellfish 1/4. Beans, Nuts & Seeds 4/4. Milk & Dairy Foods 4/6. Toppings, Oils, Seasonings & Salt 13/20. Sweets, Snacks & Restaurant Food 8/14. Beverages 10/10. B: coffee black (sometimes with half and half), eggs with olive oil L: wife will make him something D: wife cooks - protein, starch, vegetable - usually chicken. Salads. When his wife cooks, he reports eating well and with a good variety; when she doesn't he will go out to eat and have less health promoting options. Discussed last minute meals and versitile ingredients. Geoffry reports using a lot of salt right now - encouraged to reduce slowly to make changes more sustainable. Discussed heart healthy eating.      Intervention Plan   Intervention Prescribe, educate and counsel regarding individualized specific dietary modifications aiming towards targeted core components such as weight, hypertension, lipid management, diabetes, heart failure and other comorbidities.    Expected  Outcomes Short Term Goal: Understand basic principles of dietary content, such as calories, fat, sodium, cholesterol and nutrients.;Short Term Goal: A plan has been developed with personal nutrition goals set during dietitian appointment.;Long Term Goal: Adherence to prescribed nutrition plan.             Nutrition Assessments:  MEDIFICTS Score Key: ?70 Need to make dietary changes  40-70 Heart Healthy Diet ? 40 Therapeutic Level Cholesterol Diet  Flowsheet Row Cardiac Rehab from 09/16/2021 in [facility] Cardiac and Pulmonary Rehab  Picture Your Plate Total Score on Admission 66      Picture Your Plate Scores: <34 Unhealthy dietary pattern with much room for improvement. 41-50 Dietary pattern unlikely to meet recommendations for good health and room for improvement. 51-60 More healthful dietary pattern, with some room for improvement.  >60 Healthy dietary pattern, although there may be some specific behaviors that could be improved.    Nutrition Goals Re-Evaluation:  Nutrition Goals Re-Evaluation     Valentine Name 10/16/21 1014 11/04/21 1015           Goals   Current Weight 205 lb (93 kg) --      Nutrition Goal Cut back on snacking ST: try out recipes, take note of the amount of salt eaten during the day, pair peanuts with fruit LT: limit Na <2g/day, become prepared for last minute meals      Comment Ules would like to cut back on his snacking. He likes to eat peanut type snacks and other nuts. He states that peanuts are his one snack that he eats to much of. Informed him that peanuts and other nut snacks have high calories. He has tried out smoothies with basil, spinach, watermelon, chia seeds, beets, frozen banana. He felt great after having the smoothie and felt it better fueled his exercise. He is snacking on peanuts, but feels tha the may eat too many in one sitting. Advised pairing fruit with peanuts for a more balanced snack, encouraged to try cutting back on sodium as previously  discussed. He has not tried out the recipes given by this RD, but plans to.      Expected Outcome Short: cut back on portions for snacking. Long: maintain a diet that adheres to him. ST: try out recipes, take note of the amount of salt eaten during the day, pair peanuts with fruit LT: limit Na <2g/day, become prepared for last minute meals               Nutrition Goals Discharge (Final Nutrition Goals Re-Evaluation):  Nutrition Goals Re-Evaluation -  11/04/21 1015       Goals   Nutrition Goal ST: try out recipes, take note of the amount of salt eaten during the day, pair peanuts with fruit LT: limit Na <2g/day, become prepared for last minute meals    Comment He has tried out smoothies with basil, spinach, watermelon, chia seeds, beets, frozen banana. He felt great after having the smoothie and felt it better fueled his exercise. He is snacking on peanuts, but feels tha the may eat too many in one sitting. Advised pairing fruit with peanuts for a more balanced snack, encouraged to try cutting back on sodium as previously discussed. He has not tried out the recipes given by this RD, but plans to.    Expected Outcome ST: try out recipes, take note of the amount of salt eaten during the day, pair peanuts with fruit LT: limit Na <2g/day, become prepared for last minute meals             Psychosocial: Target Goals: Acknowledge presence or absence of significant depression and/or stress, maximize coping skills, provide positive support system. Participant is able to verbalize types and ability to use techniques and skills needed for reducing stress and depression.   Education: Stress, Anxiety, and Depression - Group verbal and visual presentation to define topics covered.  Reviews how body is impacted by stress, anxiety, and depression.  Also discusses healthy ways to reduce stress and to treat/manage anxiety and depression.  Written material given at graduation. Flowsheet Row Cardiac Rehab  from 11/06/2021 in [facility] Cardiac and Pulmonary Rehab  Date 10/30/21  Educator [facility]  Instruction Review Code 1- [employer] Understanding       Education: Sleep Hygiene -Provides group verbal and written instruction about how sleep can affect your health.  Define sleep hygiene, discuss sleep cycles and impact of sleep habits. Review good sleep hygiene tips.    Initial Review & Psychosocial Screening:  Initial Psych Review & Screening - 09/11/21 1021       Initial Review   Current issues with None Identified      Family Dynamics   Good Support System? Yes   wife,children. church family     Barriers   Psychosocial barriers to participate in program There are no identifiable barriers or psychosocial needs.      Screening Interventions   Interventions Encouraged to exercise    Expected Outcomes Short Term goal: Utilizing psychosocial counselor, staff and physician to assist with identification of specific Stressors or current issues interfering with healing process. Setting desired goal for each stressor or current issue identified.;Long Term Goal: Stressors or current issues are controlled or eliminated.;Short Term goal: Identification and review with participant of any Quality of Life or Depression concerns found by scoring the questionnaire.;Long Term goal: The participant improves quality of Life and PHQ9 Scores as seen by post scores and/or verbalization of changes             Quality of Life Scores:   Quality of Life - 09/16/21 1736       Quality of Life   Select Quality of Life      Quality of Life Scores   Health/Function Pre 23.9 %    Socioeconomic Pre 27.36 %    Psych/Spiritual Pre 24.21 %    Family Pre 25.2 %    GLOBAL Pre 24.87 %            Scores of 19 and below usually indicate a poorer quality of life in these  areas.  A difference of  2-3 points is a clinically meaningful difference.  A difference of 2-3 points in the total score of the Quality of Life Index  has been associated with significant improvement in overall quality of life, self-image, physical symptoms, and general health in studies assessing change in quality of life.  PHQ-9: Review Flowsheet       09/16/2021  Depression screen PHQ 2/9  Decreased Interest 0  Down, Depressed, Hopeless 0  PHQ - 2 Score 0  Altered sleeping 0  Tired, decreased energy 1  Change in appetite 1  Feeling bad or failure about yourself  0  Trouble concentrating 0  Moving slowly or fidgety/restless 0  Suicidal thoughts 0  PHQ-9 Score 2   Interpretation of Total Score  Total Score Depression Severity:  1-4 = Minimal depression, 5-9 = Mild depression, 10-14 = Moderate depression, 15-19 = Moderately severe depression, 20-27 = Severe depression   Psychosocial Evaluation and Intervention:  Psychosocial Evaluation - 09/11/21 1045       Psychosocial Evaluation & Interventions   Interventions Encouraged to exercise with the program and follow exercise prescription    Comments Aziel has no barriers to attending the program. He lives with his wife. HIs wife,  his children,granchildren and his church family are all there for support. His goal is to continue to heal and learn all he can to maintain a healthy lifestyle.  He is ready to get started.    Expected Outcomes STG Montarius attends all scheduled exercise and education sessions,LTG Leeandre ontinues to progress his exercise regimen and utilizes what he learned in the program to help him maintain a healty lifestyle    Continue Psychosocial Services  Follow up required by staff             Psychosocial Re-Evaluation:  Psychosocial Re-Evaluation     [place] Name 10/16/21 1012 11/04/21 1018           Psychosocial Re-Evaluation   Current issues with None Identified None Identified      Comments Patient reports no issues with their current mental states, sleep, stress, depression or anxiety. Will follow up with patient in a few weeks for any changes. He is a man  of faith and goes to church regularly. Patient reports no issues with their current mental states, sleep, stress, depression or anxiety. Will follow up with patient in a few weeks for any changes. He is a man of faith and goes to church regularly. He feels like he can rely on the church and his wife for support. He reports that when stress arises he practices deep breathing to combat it.      Expected Outcomes Short: Continue to exercise regularly to support mental health and notify staff of any changes. Long: maintain mental health and well being through teaching of rehab or prescribed medications independently. Short: Continue to exercise regularly to support mental health and notify staff of any changes. Long: maintain mental health and well being through teaching of rehab or prescribed medications independently.      Interventions Encouraged to attend Cardiac Rehabilitation for the exercise Encouraged to attend Cardiac Rehabilitation for the exercise      Continue Psychosocial Services  Follow up required by staff Follow up required by staff               Psychosocial Discharge (Final Psychosocial Re-Evaluation):  Psychosocial Re-Evaluation - 11/04/21 1018       Psychosocial Re-Evaluation   Current issues with  None Identified    Comments Patient reports no issues with their current mental states, sleep, stress, depression or anxiety. Will follow up with patient in a few weeks for any changes. He is a man of faith and goes to church regularly. He feels like he can rely on the church and his wife for support. He reports that when stress arises he practices deep breathing to combat it.    Expected Outcomes Short: Continue to exercise regularly to support mental health and notify staff of any changes. Long: maintain mental health and well being through teaching of rehab or prescribed medications independently.    Interventions Encouraged to attend Cardiac Rehabilitation for the exercise     Continue Psychosocial Services  Follow up required by staff             Vocational Rehabilitation: Provide vocational rehab assistance to qualifying candidates.   Vocational Rehab Evaluation & Intervention:   Education: Education Goals: Education classes will be provided on a variety of topics geared toward better understanding of heart health and risk factor modification. Participant will state understanding/return demonstration of topics presented as noted by education test scores.  Learning Barriers/Preferences:   General Cardiac Education Topics:  AED/CPR: - Group verbal and written instruction with the use of models to demonstrate the basic use of the AED with the basic ABC's of resuscitation.   Anatomy and Cardiac Procedures: - Group verbal and visual presentation and models provide information about basic cardiac anatomy and function. Reviews the testing methods done to diagnose heart disease and the outcomes of the test results. Describes the treatment choices: Medical Management, Angioplasty, or Coronary Bypass Surgery for treating various heart conditions including Myocardial Infarction, Angina, Valve Disease, and Cardiac Arrhythmias.  Written material given at graduation. Flowsheet Row Cardiac Rehab from 11/06/2021 in [facility] Cardiac and Pulmonary Rehab  Education need identified 09/16/21  Date 09/18/21  Educator SB  Instruction Review Code 1- Verbalizes Understanding       Medication Safety: - Group verbal and visual instruction to review commonly prescribed medications for heart and lung disease. Reviews the medication, class of the drug, and side effects. Includes the steps to properly store meds and maintain the prescription regimen.  Written material given at graduation.   Intimacy: - Group verbal instruction through game format to discuss how heart and lung disease can affect sexual intimacy. Written material given at graduation..   Know Your Numbers and  Heart Failure: - Group verbal and visual instruction to discuss disease risk factors for cardiac and pulmonary disease and treatment options.  Reviews associated critical values for Overweight/Obesity, Hypertension, Cholesterol, and Diabetes.  Discusses basics of heart failure: signs/symptoms and treatments.  Introduces Heart Failure Zone chart for action plan for heart failure.  Written material given at graduation.   Infection Prevention: - Provides verbal and written material to individual with discussion of infection control including proper hand washing and proper equipment cleaning during exercise session. Flowsheet Row Cardiac Rehab from 11/06/2021 in [facility] Cardiac and Pulmonary Rehab  Date 09/16/21  Educator [facility]  Instruction Review Code 1- Verbalizes Understanding       Falls Prevention: - Provides verbal and written material to individual with discussion of falls prevention and safety. Flowsheet Row Cardiac Rehab from 11/06/2021 in [facility] Cardiac and Pulmonary Rehab  Date 09/16/21  Educator [facility]  Instruction Review Code 1- Verbalizes Understanding       Other: -Provides group and verbal instruction on various topics (see comments)   Knowledge Questionnaire Score:  Knowledge Questionnaire Score - 09/16/21 1737       Knowledge Questionnaire Score   Pre Score 22/26             Core Components/Risk Factors/Patient Goals at Admission:  Personal Goals and Risk Factors at Admission - 09/16/21 1734       Core Components/Risk Factors/Patient Goals on Admission    Weight Management Yes;Weight Loss    Intervention Weight Management: Develop a combined nutrition and exercise program designed to reach desired caloric intake, while maintaining appropriate intake of nutrient and fiber, sodium and fats, and appropriate energy expenditure required for the weight goal.;Weight Management: Provide education and appropriate resources to help participant work on and attain dietary  goals.;Weight Management/Obesity: Establish reasonable short term and long term weight goals.    Admit Weight 205 lb 4.8 oz (93.1 kg)    Goal Weight: Short Term 200 lb (90.7 kg)    Goal Weight: Long Term 186 lb (84.4 kg)    Expected Outcomes Short Term: Continue to assess and modify interventions until short term weight is achieved;Weight Loss: Understanding of general recommendations for a balanced deficit meal plan, which promotes 1-2 lb weight loss per week and includes a negative energy balance of [phone] kcal/d;Understanding of distribution of calorie intake throughout the day with the consumption of 4-5 meals/snacks;Long Term: Adherence to nutrition and physical activity/exercise program aimed toward attainment of established weight goal;Understanding recommendations for meals to include 15-35% energy as protein, 25-35% energy from fat, 35-60% energy from carbohydrates, less than 261m of dietary cholesterol, 20-35 gm of total fiber daily    Lipids Yes    Intervention Provide education and support for participant on nutrition & aerobic/resistive exercise along with prescribed medications to achieve LDL <777m HDL >4052m   Expected Outcomes Short Term: Participant states understanding of desired cholesterol values and is compliant with medications prescribed. Participant is following exercise prescription and nutrition guidelines.;Long Term: Cholesterol controlled with medications as prescribed, with individualized exercise RX and with personalized nutrition plan. Value goals: LDL < 82m92mDL > 40 mg.             Education:Diabetes - Individual verbal and written instruction to review signs/symptoms of diabetes, desired ranges of glucose level fasting, after meals and with exercise. Acknowledge that pre and post exercise glucose checks will be done for 3 sessions at entry of program.   Core Components/Risk Factors/Patient Goals Review:   Goals and Risk Factor Review     Row Name 10/16/21  1006 11/04/21 1013           Core Components/Risk Factors/Patient Goals Review   Personal Goals Review Weight Management/Obesity Weight Management/Obesity;Lipids      Review JohnGiannld like to lose a few pounds. He needs to weed out some snacks that can be attrributing to his weight maintaining. His most likable snack is peanuts and states he may eat to much of them. Informed him that peanuts and nuts in general have alot of calories. Jeshawn reports it has been stable, fluctuating a couple of pounds. He reports that he got blood work last week and his cholesterol is still high. He is starting semaglutide per his doctors recommendations for this. He continues to work on diet and exercise.      Expected Outcomes Short: eat less peanuts. Long: reach weight goal. ST: start taking semaglutide and monitor for side effects LT: continue to monitor risk factors  Core Components/Risk Factors/Patient Goals at Discharge (Final Review):   Goals and Risk Factor Review - 11/04/21 1013       Core Components/Risk Factors/Patient Goals Review   Personal Goals Review Weight Management/Obesity;Lipids    Review Tyquez reports it has been stable, fluctuating a couple of pounds. He reports that he got blood work last week and his cholesterol is still high. He is starting semaglutide per his doctors recommendations for this. He continues to work on diet and exercise.    Expected Outcomes ST: start taking semaglutide and monitor for side effects LT: continue to monitor risk factors             ITP Comments:  ITP Comments     Row Name 09/11/21 1103 09/16/21 1714 09/18/21 1002 09/25/21 0815 09/30/21 1145   ITP Comments Virtual orientation call completed today. he has an appointment on Date: 09/16/2021  for EP eval and gym Orientation.  Documentation of diagnosis can be found in [facility] Date: 08/01/2021 . Completed 6MWT and gym orientation. Initial ITP created and sent for review to Dr. Emily Filbert,  Medical Director. First full day of exercise!  Patient was oriented to gym and equipment including functions, settings, policies, and procedures.  Patient's individual exercise prescription and treatment plan were reviewed.  All starting workloads were established based on the results of the 6 minute walk test done at initial orientation visit.  The plan for exercise progression was also introduced and progression will be customized based on patient's performance and goals. 30 Day review completed. Medical Director ITP review done, changes made as directed, and signed approval by Medical Director.     New Completed initial RD consultation    [place] Name 10/23/21 0936 11/20/21 0749         ITP Comments 30 Day review completed. Medical Director ITP review done, changes made as directed, and signed approval by Medical Director. 30 Day review completed. Medical Director ITP review done, changes made as directed, and signed approval by Medical Director.               Comments:

## 2021-11-25 ENCOUNTER — Encounter: Payer: Self-pay | Admitting: *Deleted

## 2021-11-25 DIAGNOSIS — Z951 Presence of aortocoronary bypass graft: Secondary | ICD-10-CM

## 2021-11-25 DIAGNOSIS — I214 Non-ST elevation (NSTEMI) myocardial infarction: Secondary | ICD-10-CM

## 2021-11-25 NOTE — Progress Notes (Signed)
Daily Session Note  Patient Details  Name: ALCEE SIPOS MRN: [id number] Date of Birth: 19-Nov-1960 Referring Provider:   Flowsheet Row Cardiac Rehab from 09/16/2021 in [facility] Cardiac and Pulmonary Rehab  Referring Provider Skains       Encounter Date: 11/25/2021  Check In:  Session Check In - 11/25/21 1051       Check-In   Supervising physician immediately available to respond to emergencies See telemetry face sheet for immediately available ER MD    Location [facility]-Cardiac & Pulmonary Rehab    Staff Present Heath Lark, RN, BSN, CCRP;Kristen Coble, RN,BC,MSN;Kelly Hurdland, BS, ACSM CEP, Exercise Physiologist    Virtual Visit No    Medication changes reported     No    Fall or balance concerns reported    No    Warm-up and Cool-down Performed on first and last piece of equipment    Resistance Training Performed Yes    VAD Patient? No    PAD/SET Patient? No      Pain Assessment   Currently in Pain? No/denies                Social History   Tobacco Use  Smoking Status Former   Packs/day: 1.00   Years: 16.00   Total pack years: 16.00   Types: Cigarettes   Quit date: 05/13/1991   Years since quitting: 30.5  Smokeless Tobacco Never  Tobacco Comments   Quit 30 years ago    Goals Met:  Independence with exercise equipment Exercise tolerated well No report of concerns or symptoms today  Goals Unmet:  Not Applicable  Comments: Pt able to follow exercise prescription today without complaint.  Will continue to monitor for progression.    Dr. Emily Filbert is Medical Director for [place].  Dr. Ottie Glazier is Medical Director for [facility] Pulmonary Rehabilitation.

## 2021-11-27 DIAGNOSIS — Z951 Presence of aortocoronary bypass graft: Secondary | ICD-10-CM

## 2021-11-27 DIAGNOSIS — I214 Non-ST elevation (NSTEMI) myocardial infarction: Secondary | ICD-10-CM

## 2021-11-27 NOTE — Progress Notes (Signed)
Daily Session Note  Patient Details  Name: Antonio [name] MRN: [id number] Date of Birth: 1961/01/07 Referring Provider:   Flowsheet Row Cardiac Rehab from 09/16/2021 in [facility] Cardiac and Pulmonary Rehab  Referring Provider Skains       Encounter Date: 11/27/2021  Check In:  Session Check In - 11/27/21 0953       Check-In   Supervising physician immediately available to respond to emergencies See telemetry face sheet for immediately available ER MD    Location [facility]-Cardiac & Pulmonary Rehab    Staff Present Antionette Fairy, BS, Exercise Physiologist;Susanne Bice, RN, BSN, CCRP;Kristen Coble, RN,BC,MSN;Melissa Fairplay, RDN, LDN    Virtual Visit No    Medication changes reported     No    Fall or balance concerns reported    No    Tobacco Cessation No Change    Warm-up and Cool-down Performed on first and last piece of equipment    Resistance Training Performed Yes    VAD Patient? No    PAD/SET Patient? No      Pain Assessment   Currently in Pain? No/denies    Multiple Pain Sites No                Social History   Tobacco Use  Smoking Status Former   Packs/day: 1.00   Years: 16.00   Total pack years: 16.00   Types: Cigarettes   Quit date: 05/13/1991   Years since quitting: 30.5  Smokeless Tobacco Never  Tobacco Comments   Quit 30 years ago    Goals Met:  Independence with exercise equipment Exercise tolerated well No report of concerns or symptoms today  Goals Unmet:  Not Applicable  Comments: Pt able to follow exercise prescription today without complaint.  Will continue to monitor for progression.    Dr. Emily Filbert is Medical Director for [place].  Dr. Ottie Glazier is Medical Director for [facility] Pulmonary Rehabilitation.

## 2021-12-02 ENCOUNTER — Encounter: Payer: Self-pay | Admitting: *Deleted

## 2021-12-02 DIAGNOSIS — I214 Non-ST elevation (NSTEMI) myocardial infarction: Secondary | ICD-10-CM

## 2021-12-02 DIAGNOSIS — Z951 Presence of aortocoronary bypass graft: Secondary | ICD-10-CM

## 2021-12-02 NOTE — Progress Notes (Signed)
Daily Session Note  Patient Details  Name: ROSHARD REZABEK MRN: [id number] Date of Birth: [birth year] Referring Provider:   Flowsheet Row Cardiac Rehab from 09/16/2021 in [facility] Cardiac and Pulmonary Rehab  Referring Provider Skains       Encounter Date: 12/02/2021  Check In:  Session Check In - 12/02/21 1034       Check-In   Supervising physician immediately available to respond to emergencies See telemetry face sheet for immediately available ER MD    Location [facility]-Cardiac & Pulmonary Rehab    Staff Present Alberteen Sam, MA, RCEP, CCRP, Rosalio Macadamia, BS, ACSM CEP, Exercise Physiologist;Marte Celani Sherryll Burger, RN BSN    Virtual Visit No    Medication changes reported     No    Fall or balance concerns reported    No    Warm-up and Cool-down Performed on first and last piece of equipment    Resistance Training Performed Yes    VAD Patient? No    PAD/SET Patient? No      Pain Assessment   Currently in Pain? No/denies                Social History   Tobacco Use  Smoking Status Former   Packs/day: 1.00   Years: 16.00   Total pack years: 16.00   Types: Cigarettes   Quit date: 05/13/1991   Years since quitting: 30.5  Smokeless Tobacco Never  Tobacco Comments   Quit 30 years ago    Goals Met:  Independence with exercise equipment Exercise tolerated well No report of concerns or symptoms today Strength training completed today  Goals Unmet:  Not Applicable  Comments: Pt able to follow exercise prescription today without complaint.  Will continue to monitor for progression.    Dr. Emily Filbert is Medical Director for [place].  Dr. Ottie Glazier is Medical Director for [facility] Pulmonary Rehabilitation.

## 2021-12-06 ENCOUNTER — Encounter: Payer: Self-pay | Admitting: *Deleted

## 2021-12-06 DIAGNOSIS — I214 Non-ST elevation (NSTEMI) myocardial infarction: Secondary | ICD-10-CM

## 2021-12-06 DIAGNOSIS — Z951 Presence of aortocoronary bypass graft: Secondary | ICD-10-CM

## 2021-12-06 NOTE — Progress Notes (Signed)
Daily Session Note  Patient Details  Name: Antonio [name] MRN: [id number] Date of Birth: 1960/07/31 Referring Provider:   Flowsheet Row Cardiac Rehab from 09/16/2021 in [facility] Cardiac and Pulmonary Rehab  Referring Provider Skains       Encounter Date: 12/06/2021  Check In:  Session Check In - 12/06/21 0959       Check-In   Supervising physician immediately available to respond to emergencies See telemetry face sheet for immediately available ER MD    Location [facility]-Cardiac & Pulmonary Rehab    Staff Present Alberteen Sam, MA, RCEP, CCRP, CCET;Laureen Owens Shark, BS, RRT, CPFT;Bonnee Zertuche Sherryll Burger, RN BSN    Virtual Visit No    Medication changes reported     No    Fall or balance concerns reported    No    Warm-up and Cool-down Performed on first and last piece of equipment    Resistance Training Performed Yes    VAD Patient? No    PAD/SET Patient? No      Pain Assessment   Currently in Pain? No/denies                Social History   Tobacco Use  Smoking Status Former   Packs/day: 1.00   Years: 16.00   Total pack years: 16.00   Types: Cigarettes   Quit date: 05/13/1991   Years since quitting: 30.5  Smokeless Tobacco Never  Tobacco Comments   Quit 30 years ago    Goals Met:  Independence with exercise equipment Exercise tolerated well No report of concerns or symptoms today Strength training completed today  Goals Unmet:  Not Applicable  Comments: Pt able to follow exercise prescription today without complaint.  Will continue to monitor for progression.    Dr. Emily Filbert is Medical Director for [place].  Dr. Ottie Glazier is Medical Director for [facility] Pulmonary Rehabilitation.

## 2021-12-11 ENCOUNTER — Encounter: Payer: Self-pay | Attending: Cardiology

## 2021-12-11 DIAGNOSIS — I214 Non-ST elevation (NSTEMI) myocardial infarction: Secondary | ICD-10-CM | POA: Insufficient documentation

## 2021-12-11 DIAGNOSIS — Z951 Presence of aortocoronary bypass graft: Secondary | ICD-10-CM | POA: Insufficient documentation

## 2021-12-11 NOTE — Progress Notes (Signed)
Daily Session Note  Patient Details  Name: Antonio [name] MRN: [id number] Date of Birth: [birth year] Referring Provider:   Flowsheet Row Cardiac Rehab from 09/16/2021 in [facility] Cardiac and Pulmonary Rehab  Referring Provider Skains       Encounter Date: 12/11/2021  Check In:  Session Check In - 12/11/21 1005       Check-In   Supervising physician immediately available to respond to emergencies See telemetry face sheet for immediately available ER MD    Staff Present Carson Myrtle, BS, RRT, CPFT;Uyen Eichholz, BS, Exercise Physiologist;Susanne Bice, RN, BSN, CCRP;Melissa Caiola, RDN, LDN    Virtual Visit No    Medication changes reported     No    Fall or balance concerns reported    No    Warm-up and Cool-down Performed on first and last piece of equipment    Resistance Training Performed Yes    VAD Patient? No    PAD/SET Patient? No      Pain Assessment   Currently in Pain? No/denies    Multiple Pain Sites No                Social History   Tobacco Use  Smoking Status Former   Packs/day: 1.00   Years: 16.00   Total pack years: 16.00   Types: Cigarettes   Quit date: 05/13/1991   Years since quitting: 30.6  Smokeless Tobacco Never  Tobacco Comments   Quit 30 years ago    Goals Met:  Independence with exercise equipment Exercise tolerated well No report of concerns or symptoms today  Goals Unmet:  Not Applicable  Comments: Pt able to follow exercise prescription today without complaint.  Will continue to monitor for progression.    Dr. Emily Filbert is Medical Director for [place].  Dr. Ottie Glazier is Medical Director for [facility] Pulmonary Rehabilitation.

## 2021-12-13 ENCOUNTER — Encounter: Payer: Self-pay | Admitting: *Deleted

## 2021-12-13 DIAGNOSIS — I214 Non-ST elevation (NSTEMI) myocardial infarction: Secondary | ICD-10-CM

## 2021-12-13 DIAGNOSIS — Z951 Presence of aortocoronary bypass graft: Secondary | ICD-10-CM

## 2021-12-13 NOTE — Progress Notes (Signed)
Daily Session Note  Patient Details  Name: Antonio [name] MRN: [id number] Date of Birth: April 10, 1961 Referring Provider:   Flowsheet Row Cardiac Rehab from 09/16/2021 in [facility] Cardiac and Pulmonary Rehab  Referring Provider Skains       Encounter Date: 12/13/2021  Check In:  Session Check In - 12/13/21 1045       Check-In   Supervising physician immediately available to respond to emergencies See telemetry face sheet for immediately available ER MD    Location [facility]-Cardiac & Pulmonary Rehab    Staff Present Will Bonnet, RN,BC,MSN;Avrey Hyser Sherryll Burger, RN BSN;Susanne Bice, RN, BSN, CCRP    Virtual Visit No    Medication changes reported     Yes    Comments added tadalafil 36m daily    Fall or balance concerns reported    No    Warm-up and Cool-down Performed on first and last piece of equipment    Resistance Training Performed Yes    VAD Patient? No    PAD/SET Patient? No      Pain Assessment   Currently in Pain? No/denies                Social History   Tobacco Use  Smoking Status Former   Packs/day: 1.00   Years: 16.00   Total pack years: 16.00   Types: Cigarettes   Quit date: 05/13/1991   Years since quitting: 30.6  Smokeless Tobacco Never  Tobacco Comments   Quit 30 years ago    Goals Met:  Independence with exercise equipment Exercise tolerated well No report of concerns or symptoms today Strength training completed today  Goals Unmet:  Not Applicable  Comments: Pt able to follow exercise prescription today without complaint.  Will continue to monitor for progression.'   Dr. MEmily Filbertis Medical Director for [place]  Dr. FOttie Glazieris [facility].

## 2021-12-16 ENCOUNTER — Ambulatory Visit (INDEPENDENT_AMBULATORY_CARE_PROVIDER_SITE_OTHER): Payer: Self-pay | Admitting: Cardiology

## 2021-12-16 ENCOUNTER — Encounter: Payer: Self-pay | Admitting: Cardiology

## 2021-12-16 VITALS — BP 124/80 | HR 76 | Ht 69.0 in | Wt 206.0 lb

## 2021-12-16 DIAGNOSIS — I10 Essential (primary) hypertension: Secondary | ICD-10-CM

## 2021-12-16 DIAGNOSIS — E785 Hyperlipidemia, unspecified: Secondary | ICD-10-CM

## 2021-12-16 DIAGNOSIS — Z951 Presence of aortocoronary bypass graft: Secondary | ICD-10-CM

## 2021-12-16 NOTE — Progress Notes (Signed)
Cardiology Office Note:    Date:  12/16/2021   ID:  Antonio [name], DOB June 04, 1960, MRN [id number]  PCP:  Antonio Corona, NP   [facility] Providers Cardiologist:  Candee Furbish, MD     Referring MD: Antonio Corona, NP    History of Present Illness:    Antonio [name] is a 61 y.o. male here for follow-up post CABG x 4 LIMA to LAD radial to OM SVG to diagonal and BAV.  Echo 60 to 65% EF.  Overall doing quite well.  Had some lightheadedness previously on initial follow-up in April 2023.  This is gone away since stopping the amlodipine 2.5.  Overall he is been doing quite well.  Taking his atorvastatin 80 mg.  His last LDL was 88.  We would like to be less than 70.  He has recently started semaglutide for metabolic syndrome.  Has had some GERD with this.  We talked about discontinuing the Plavix after 1 year [place] post CABG.  He feels great.  He was very thankful.  No chest pain, no shortness of breath    Past Medical History:  Diagnosis Date   Basal cell carcinoma 03/23/2018   right upper lip/inf nasolabial   Basal cell carcinoma 11/23/2019   Right Spinal Upper Back, EDC    Past Surgical History:  Procedure Laterality Date   CORONARY ARTERY BYPASS GRAFT N/A 08/06/2021   Procedure: CORONARY ARTERY BYPASS GRAFTING X4, With Left Radial Artery Harvest, Right Greater Saphenous Vein Harvested Endoscopically;  Surgeon: Lajuana Matte, MD;  Location: [place];  Service: Open Heart Surgery;  Laterality: N/A;   LEFT HEART CATH AND CORONARY ANGIOGRAPHY N/A 08/01/2021   Procedure: LEFT HEART CATH AND CORONARY ANGIOGRAPHY;  Surgeon: Jettie Booze, MD;  Location: [place] [facility];  Service: Cardiovascular;  Laterality: N/A;   RADIAL ARTERY HARVEST Left 08/06/2021   Procedure: RADIAL ARTERY HARVEST;  Surgeon: Lajuana Matte, MD;  Location: [place];  Service: Open Heart Surgery;  Laterality: Left;   TEE WITHOUT CARDIOVERSION N/A 08/06/2021   Procedure: TRANSESOPHAGEAL ECHOCARDIOGRAM  (TEE);  Surgeon: Lajuana Matte, MD;  Location: [place];  Service: Open Heart Surgery;  Laterality: N/A;    Current Medications: Current Meds  Medication Sig   Ascorbic Acid (VITAMIN C) 1000 MG tablet Take 1,000 mg by mouth daily.   aspirin EC 81 MG tablet Take 81 mg by mouth daily. Swallow whole.   atorvastatin (LIPITOR) 80 MG tablet Take 1 tablet (80 mg total) by mouth daily at 6 PM.   cholecalciferol (VITAMIN D3) 25 MCG (1000 UNIT) tablet Take 1,000 Units by mouth daily.   clopidogrel (PLAVIX) 75 MG tablet Take 1 tablet (75 mg total) by mouth daily.   metoprolol tartrate (LOPRESSOR) 25 MG tablet Take 1 tablet (25 mg total) by mouth 2 (two) times daily.   Multiple Vitamins-Minerals (LIVER DETOX PO) Take 1 tablet by mouth daily.   SEMAGLUTIDE-WEIGHT MANAGEMENT  Inject 20 Units into the skin daily.   tadalafil (CIALIS) 5 MG tablet Take 5 mg by mouth daily.   testosterone cypionate (DEPOTESTOSTERONE CYPIONATE) 200 MG/ML injection    vitamin B-12 (CYANOCOBALAMIN) 500 MCG tablet Take 500 mcg by mouth daily.     Allergies:   Patient has no known allergies.   Social History   Socioeconomic History   Marital status: Married    Spouse name: Not on file   Number of children: Not on file   Years of education: Not on  file   Highest education level: Not on file  Occupational History   Not on file  Tobacco Use   Smoking status: Former    Packs/day: 1.00    Years: 16.00    Total pack years: 16.00    Types: Cigarettes    Quit date: 05/13/1991    Years since quitting: 30.6   Smokeless tobacco: Never   Tobacco comments:    Quit 30 years ago  Vaping Use   Vaping Use: Never used  Substance and Sexual Activity   Alcohol use: Yes    Alcohol/week: 10.0 standard drinks of alcohol    Types: 10 Standard drinks or equivalent per week   Drug use: Never   Sexual activity: Not on file  Other Topics Concern   Not on file  Social History Narrative   Not on file   Social Determinants of  Health   Financial Resource Strain: Not on file  Food Insecurity: Not on file  Transportation Needs: Not on file  Physical Activity: Not on file  Stress: Not on file  Social Connections: Not on file     Family History: The patient's family history is not on file.  ROS:   Please see the history of present illness.     All other systems reviewed and are negative.  EKGs/Labs/Other Studies Reviewed:    The following studies were reviewed today: Echo 07/2021 1. Left ventricular ejection fraction, by estimation, is 60 to 65%. The  left ventricle has normal function. The left ventricle has no regional  wall motion abnormalities. There is mild concentric left ventricular  hypertrophy. Left ventricular diastolic  parameters are indeterminate.   2. Right ventricular systolic function is normal. The right ventricular  size is normal. Tricuspid regurgitation signal is inadequate for assessing  PA pressure.   3. The mitral valve is normal in structure. No evidence of mitral valve  regurgitation. No evidence of mitral stenosis.   4. The aortic valve is tricuspid. There is mild calcification of the  aortic valve. There is mild thickening of the aortic valve. Aortic valve  regurgitation is not visualized. No aortic stenosis is present.   5. Aortic dilatation noted. There is mild dilatation of the ascending  aorta, measuring 41 mm.   6. The inferior vena cava is normal in size with <50% respiratory  variability, suggesting right atrial pressure of 8 mmHg.    LEFT HEART CATH AND CORONARY ANGIOGRAPHY 08/01/21    Conclusion       Prox RCA lesion is 100% stenosed.   1st Mrg lesion is 99% stenosed.   Prox Cx to Mid Cx lesion is 50% stenosed.   Mid LAD lesion is 75% stenosed.   2nd Diag lesion is 75% stenosed.   The left ventricular systolic function is normal.   LV end diastolic pressure is normal.   The left ventricular ejection fraction is 55-65% by visual estimate.   There is no aortic  valve stenosis.   Severe three vessel disease.  Normal LV function.  Plan for cardiac surgery consult   Diagnostic Dominance: Right      Recent Labs: 08/01/2021: B Natriuretic Peptide 26.3 08/07/2021: Magnesium 2.3 08/09/2021: BUN 19; Creatinine, Ser 0.90; Hemoglobin 12.4; Platelets 139; Potassium 4.7; Sodium 133 10/09/2021: ALT 20  Recent Lipid Panel    Component Value Date/Time   CHOL 155 10/09/2021 0000   TRIG 90 10/09/2021 0000   TRIG 74 04/21/2006 0727   HDL 50 10/09/2021 0000   CHOLHDL 3.1 10/09/2021  0000   CHOLHDL 4.6 08/02/2021 0804   VLDL 20 08/02/2021 0804   LDLCALC 88 10/09/2021 0000   LDLDIRECT 167.2 04/21/2006 0727     Risk Assessment/Calculations:              Physical Exam:    VS:  BP 124/80 (BP Location: Left Arm, Patient Position: Sitting, Cuff Size: Normal)   Pulse 76   Ht '5\' 9"'$  (1.753 m)   Wt 206 lb (93.4 kg)   SpO2 97%   BMI 30.42 kg/m     Wt Readings from Last 3 Encounters:  12/16/21 206 lb (93.4 kg)  09/16/21 205 lb 4.8 oz (93.1 kg)  09/12/21 200 lb (90.7 kg)     GEN:  Well nourished, well developed in no acute distress HEENT: Normal NECK: No JVD; No carotid bruits LYMPHATICS: No lymphadenopathy CARDIAC: RRR, no murmurs, no rubs, gallops RESPIRATORY:  Clear to auscultation without rales, wheezing or rhonchi  ABDOMEN: Soft, non-tender, non-distended MUSCULOSKELETAL:  No edema; No deformity  SKIN: Warm and dry NEUROLOGIC:  Alert and oriented x 3 PSYCHIATRIC:  Normal affect   ASSESSMENT:    1. Hyperlipidemia, unspecified hyperlipidemia type   2. S/P CABG x 4   3. Essential hypertension    PLAN:    In order of problems listed above:  Coronary artery disease status post CABG March 2023 - Overall doing well without any anginal symptoms.  Currently on aspirin Plavix statin and beta-blocker.  No changes made to overall prescription drug management.  Reviewed.  Cardiac rehab.  We will continue the Plavix for the remainder of the  year.  Pure hypercholesterolemia - LDL 88 triglycerides 90 HDL 50.  This is on atorvastatin 80 mg daily.  Lets go ahead and add Zetia 10 mg to help get him to less than 70.  Hypertension - Had some orthostatic hypotension previously.  He stopped his amlodipine 2.5 mg and seems to be doing better.       Medication Adjustments/Labs and Tests Ordered: Current medicines are reviewed at length with the patient today.  Concerns regarding medicines are outlined above.  Orders Placed This Encounter  Procedures   Lipid panel   No orders of the defined types were placed in this encounter.   Patient Instructions  Medication Instructions:  Your physician recommends that you continue on your current medications as directed. Please refer to the Current Medication list given to you today.  *If you need a refill on your cardiac medications before your next appointment, please call your pharmacy*   Lab Work: TO BE DONE IN 3 MONTHS: FASTING LIPIDS If you have labs (blood work) drawn today and your tests are completely normal, you will receive your results only by: [place] (if you have MyChart) OR A paper copy in the mail If you have any lab test that is abnormal or we need to change your treatment, we will call you to review the results.   Testing/Procedures: NONE   Follow-Up: At [facility], you and your health needs are our priority.  As part of our continuing mission to provide you with exceptional heart care, we have created designated Provider Care Teams.  These Care Teams include your primary Cardiologist (physician) and Advanced Practice Providers (APPs -  Physician Assistants and Nurse Practitioners) who all work together to provide you with the care you need, when you need it.  We recommend signing up for the patient portal called "MyChart".  Sign up information is provided on this After  Visit Summary.  MyChart is used to connect with patients for Virtual Visits  (Telemedicine).  Patients are able to view lab/test results, encounter notes, upcoming appointments, etc.  Non-urgent messages can be sent to your provider as well.   To learn more about what you can do with MyChart, go to [email or web address].    Your next appointment:   6 month(s)  The format for your next appointment:   In Person  Provider:   Candee Furbish, MD {  Important Information About Sugar         Signed, Candee Furbish, MD  12/16/2021 10:35 AM    [place]

## 2021-12-16 NOTE — Patient Instructions (Signed)
Medication Instructions:  Your physician recommends that you continue on your current medications as directed. Please refer to the Current Medication list given to you today.  *If you need a refill on your cardiac medications before your next appointment, please call your pharmacy*   Lab Work: TO BE DONE IN 3 MONTHS: FASTING LIPIDS If you have labs (blood work) drawn today and your tests are completely normal, you will receive your results only by: [place] (if you have MyChart) OR A paper copy in the mail If you have any lab test that is abnormal or we need to change your treatment, we will call you to review the results.   Testing/Procedures: NONE   Follow-Up: At [facility], you and your health needs are our priority.  As part of our continuing mission to provide you with exceptional heart care, we have created designated Provider Care Teams.  These Care Teams include your primary Cardiologist (physician) and Advanced Practice Providers (APPs -  Physician Assistants and Nurse Practitioners) who all work together to provide you with the care you need, when you need it.  We recommend signing up for the patient portal called "MyChart".  Sign up information is provided on this After Visit Summary.  MyChart is used to connect with patients for Virtual Visits (Telemedicine).  Patients are able to view lab/test results, encounter notes, upcoming appointments, etc.  Non-urgent messages can be sent to your provider as well.   To learn more about what you can do with MyChart, go to [email or web address].    Your next appointment:   6 month(s)  The format for your next appointment:   In Person  Provider:   Candee Furbish, MD {  Important Information About Sugar

## 2021-12-18 ENCOUNTER — Encounter: Payer: Self-pay | Admitting: *Deleted

## 2021-12-18 DIAGNOSIS — Z951 Presence of aortocoronary bypass graft: Secondary | ICD-10-CM

## 2021-12-18 DIAGNOSIS — I214 Non-ST elevation (NSTEMI) myocardial infarction: Secondary | ICD-10-CM

## 2021-12-18 NOTE — Progress Notes (Signed)
Cardiac Individual Treatment Plan  Patient Details  Name: MIZAEL SAGAR MRN: [id number] Date of Birth: [birth year] Referring Provider:   Flowsheet Row Cardiac Rehab from 09/16/2021 in [facility] Cardiac and Pulmonary Rehab  Referring Provider Skains       Initial Encounter Date:  Flowsheet Row Cardiac Rehab from 09/16/2021 in [facility] Cardiac and Pulmonary Rehab  Date 09/16/21       Visit Diagnosis: NSTEMI (non-ST elevation myocardial infarction) ([employer])  S/P CABG x 4  Patient's Home Medications on Admission:  Current Outpatient Medications:    Ascorbic Acid (VITAMIN C) 1000 MG tablet, Take 1,000 mg by mouth daily., Disp: , Rfl:    aspirin EC 81 MG tablet, Take 81 mg by mouth daily. Swallow whole., Disp: , Rfl:    atorvastatin (LIPITOR) 80 MG tablet, Take 1 tablet (80 mg total) by mouth daily at 6 PM., Disp: 30 tablet, Rfl: 2   cholecalciferol (VITAMIN D3) 25 MCG (1000 UNIT) tablet, Take 1,000 Units by mouth daily., Disp: , Rfl:    clopidogrel (PLAVIX) 75 MG tablet, Take 1 tablet (75 mg total) by mouth daily., Disp: 30 tablet, Rfl: 11   metoprolol tartrate (LOPRESSOR) 25 MG tablet, Take 1 tablet (25 mg total) by mouth 2 (two) times daily., Disp: 60 tablet, Rfl: 2   Multiple Vitamins-Minerals (LIVER DETOX PO), Take 1 tablet by mouth daily., Disp: , Rfl:    SEMAGLUTIDE-WEIGHT MANAGEMENT [place], Inject 20 Units into the skin daily., Disp: , Rfl:    tadalafil (CIALIS) 5 MG tablet, Take 5 mg by mouth daily., Disp: , Rfl:    testosterone cypionate (DEPOTESTOSTERONE CYPIONATE) 200 MG/ML injection, , Disp: , Rfl:    vitamin B-12 (CYANOCOBALAMIN) 500 MCG tablet, Take 500 mcg by mouth daily., Disp: , Rfl:   Past Medical History: Past Medical History:  Diagnosis Date   Basal cell carcinoma 03/23/2018   right upper lip/inf nasolabial   Basal cell carcinoma 11/23/2019   Right Spinal Upper Back, EDC    Tobacco Use: Social History   Tobacco Use  Smoking Status Former   Packs/day: 1.00   Years: 16.00    Total pack years: 16.00   Types: Cigarettes   Quit date: 05/13/1991   Years since quitting: 30.6  Smokeless Tobacco Never  Tobacco Comments   Quit 30 years ago    Labs: Review Flowsheet  More data may exist      Latest Ref Rng & Units 04/21/2006 08/02/2021 08/05/2021 08/06/2021 10/09/2021  Labs for ITP Cardiac and Pulmonary Rehab  Cholestrol 100 - 199 mg/dL 204  192  - - 155   LDL (calc) 0 - 99 mg/dL - 130  - - 88   Direct LDL mg/dL 167.2  - - - -  HDL-C >39 mg/dL 39.5  42  - - 50   Trlycerides 0 - 149 mg/dL 74  102  - - 90   Hemoglobin A1c 4.8 - 5.6 % - 5.6  - - -  PH, Arterial 7.35 - 7.45 - - 7.46  7.347  7.314  7.268  7.235  7.327  7.370  7.374  7.329  7.320  -  PCO2 arterial 32 - 48 mmHg - - 34  42.2  38.8  41.6  53.8  46.2  41.6  42.9  50.1  49.7  -  Bicarbonate 20.0 - 28.0 mmol/L - - 24.2  23.0  19.7  19.2  23.0  24.2  24.0  25.1  24.4  26.4  25.6  -  TCO2 22 -  32 mmol/L - - - _0 -  Acid-base deficit 0.0 - 2.0 mmol/L - - - 2.0  6.0  8.0  5.0  2.0  1.0  2.0  1.0  -  O2 Saturation % - - 97.9  95  95  94  95  100  100  100  85  100  100  -     Exercise Target Goals: Exercise Program Goal: Individual exercise prescription set using results from initial 6 min walk test and THRR while considering  patient's activity barriers and safety.   Exercise Prescription Goal: Initial exercise prescription builds to 30-45 minutes a day of aerobic activity, 2-3 days per week.  Home exercise guidelines will be given to patient during program as part of exercise prescription that the participant will acknowledge.   Education: Aerobic Exercise: - Group verbal and visual presentation on the components of exercise prescription. Introduces F.I.T.T principle from ACSM for exercise prescriptions.  Reviews F.I.T.T. principles of aerobic exercise including progression. Written material given at graduation. Flowsheet Row Cardiac Rehab from 12/11/2021 in  [facility] Cardiac and Pulmonary Rehab  Education need identified 09/16/21       Education: Resistance Exercise: - Group verbal and visual presentation on the components of exercise prescription. Introduces F.I.T.T principle from ACSM for exercise prescriptions  Reviews F.I.T.T. principles of resistance exercise including progression. Written material given at graduation. Flowsheet Row Cardiac Rehab from 12/11/2021 in [facility] Cardiac and Pulmonary Rehab  Date 09/18/21  Educator [facility]  Instruction Review Code 1- Verbalizes Understanding        Education: Exercise & Equipment Safety: - Individual verbal instruction and demonstration of equipment use and safety with use of the equipment. Flowsheet Row Cardiac Rehab from 12/11/2021 in [facility] Cardiac and Pulmonary Rehab  Date 09/16/21  Educator [facility]  Instruction Review Code 1- Verbalizes Understanding       Education: Exercise Physiology & General Exercise Guidelines: - Group verbal and written instruction with models to review the exercise physiology of the cardiovascular system and associated critical values. Provides general exercise guidelines with specific guidelines to those with heart or lung disease.  Flowsheet Row Cardiac Rehab from 12/11/2021 in [facility] Cardiac and Pulmonary Rehab  Date 11/06/21  Educator NT  Instruction Review Code 1- [employer] Understanding       Education: Flexibility, Balance, Mind/Body Relaxation: - Group verbal and visual presentation with interactive activity on the components of exercise prescription. Introduces F.I.T.T principle from ACSM for exercise prescriptions. Reviews F.I.T.T. principles of flexibility and balance exercise training including progression. Also discusses the mind body connection.  Reviews various relaxation techniques to help reduce and manage stress (i.e. Deep breathing, progressive muscle relaxation, and visualization). Balance handout provided to take home. Written material given at  graduation. Flowsheet Row Cardiac Rehab from 12/11/2021 in [facility] Cardiac and Pulmonary Rehab  Date 09/25/21  Educator [facility]  Instruction Review Code 1- Verbalizes Understanding       Activity Barriers & Risk Stratification:  Activity Barriers & Cardiac Risk Stratification - 09/11/21 1019       Activity Barriers & Cardiac Risk Stratification   Activity Barriers None    Cardiac Risk Stratification High             6 Minute Walk:  6 Minute Walk     Row Name 09/16/21 1714  6 Minute Walk   Phase Initial     Distance 1760 feet     Walk Time 6 minutes     # of Rest Breaks 0     MPH 3.33     METS 4.2     RPE 12     Perceived Dyspnea  0     VO2 Peak 14.69     Symptoms No     Resting HR 71 bpm     Resting BP 104/70     Resting Oxygen Saturation  96 %     Exercise Oxygen Saturation  during 6 min walk 96 %     Max Ex. HR 82 bpm     Max Ex. BP 134/70     2 Minute Post BP 124/72              Oxygen Initial Assessment:   Oxygen Re-Evaluation:   Oxygen Discharge (Final Oxygen Re-Evaluation):   Initial Exercise Prescription:  Initial Exercise Prescription - 09/17/21 0700       Date of Initial Exercise RX and Referring Provider   Date 09/16/21    Referring Provider Skains      Oxygen   Maintain Oxygen Saturation 88% or higher      Treadmill   MPH 3.5    Grade 1    Minutes 15    METs 4.16      Recumbant Elliptical   Level 2    RPM 50    Minutes 15    METs 4.2      Elliptical   Level 1    Speed 3    Minutes 15    METs 4.2      REL-XR   Level 3    Speed 50    Minutes 15    METs 4.2      Prescription Details   Frequency (times per week) 3      Intensity   THRR 40-80% of Max Heartrate 105-140    Ratings of Perceived Exertion 11-13    Perceived Dyspnea 0-4      Progression   Progression Continue to progress workloads to maintain intensity without signs/symptoms of physical distress.      Resistance Training   Training  Prescription Yes    Weight 5    Reps 10-15             Perform Capillary Blood Glucose checks as needed.  Exercise Prescription Changes:   Exercise Prescription Changes     Row Name 09/16/21 1700 09/23/21 1600 09/27/21 1000 10/09/21 1500 10/22/21 0800     Response to Exercise   Blood Pressure (Admit) 104/70 126/80 -- 126/70 126/64   Blood Pressure (Exercise) 134/70 124/62 -- 148/70 180/80   Blood Pressure (Exit) 124/72 98/56 -- 100/60 110/60   Heart Rate (Admit) 71 bpm 83 bpm -- 85 bpm 75 bpm   Heart Rate (Exercise) 118 bpm 128 bpm -- 147 bpm 140 bpm   Heart Rate (Exit) 82 bpm 95 bpm -- 100 bpm 106 bpm   Oxygen Saturation (Admit) 96 % -- -- -- --   Oxygen Saturation (Exercise) 96 % -- -- -- --   Oxygen Saturation (Exit) 96 % -- -- -- --   Rating of Perceived Exertion (Exercise) 12 13 -- 15 13   Perceived Dyspnea (Exercise) 0 -- -- -- --   Symptoms none none -- none none   Comments 6 MWT results third full day of exercise -- -- --  Duration -- Continue with 30 min of aerobic exercise without signs/symptoms of physical distress. -- Continue with 30 min of aerobic exercise without signs/symptoms of physical distress. Continue with 30 min of aerobic exercise without signs/symptoms of physical distress.   Intensity -- THRR unchanged -- THRR unchanged THRR unchanged     Progression   Progression -- Continue to progress workloads to maintain intensity without signs/symptoms of physical distress. -- Continue to progress workloads to maintain intensity without signs/symptoms of physical distress. Continue to progress workloads to maintain intensity without signs/symptoms of physical distress.   Average METs -- 4.41 -- 5.4 5.84     Resistance Training   Training Prescription -- Yes -- Yes Yes   Weight -- 5 lb -- 5 lb 5 lb   Reps -- 10-15 -- 10-15 10-15     Interval Training   Interval Training -- No -- No No     Treadmill   MPH -- 3.5 -- 3.9 4   Grade -- 2 -- 2.5 3   Minutes  -- 15 -- 15 15   METs -- 4.65 -- 5.33 5.72     Recumbant Elliptical   Level -- 4 -- -- --   Minutes -- 15 -- -- --   METs -- 5.5 -- -- --     Elliptical   Level -- -- -- 7 8   Speed -- -- -- -- 3   Minutes -- -- -- 15 15   METs -- -- -- 6.6 6.5     REL-XR   Level -- 8 -- 8 8   Minutes -- 15 -- 15 15   METs -- 3.1 -- 5.3 5.3     Home Exercise Plan   Plans to continue exercise at -- -- Home (comment)  walking, home gym Home (comment)  walking, home gym Home (comment)  walking, home gym   Frequency -- -- Add 2 additional days to program exercise sessions. Add 2 additional days to program exercise sessions. Add 2 additional days to program exercise sessions.   Initial Home Exercises Provided -- -- 09/27/21 09/27/21 09/27/21     Oxygen   Maintain Oxygen Saturation -- 88% or higher -- 88% or higher 88% or higher    Row Name 11/04/21 1000 11/18/21 1400 12/03/21 0900 12/16/21 1100       Response to Exercise   Blood Pressure (Admit) 130/76 124/62 126/70 124/82    Blood Pressure (Exit) 1_0 112/74    Heart Rate (Admit) 78 bpm 84 bpm 75 bpm 74 bpm    Heart Rate (Exercise) 149 bpm 137 bpm 142 bpm 137 bpm    Heart Rate (Exit) 89 bpm 116 bpm 110 bpm 80 bpm    Oxygen Saturation (Admit) -- 98 % 97 % 97 %    Oxygen Saturation (Exercise) -- 96 % 94 % 95 %    Oxygen Saturation (Exit) -- 96 % 97 % 97 %    Rating of Perceived Exertion (Exercise) _1 Symptoms none none none none    Duration Continue with 30 min of aerobic exercise without signs/symptoms of physical distress. Continue with 30 min of aerobic exercise without signs/symptoms of physical distress. Continue with 30 min of aerobic exercise without signs/symptoms of physical distress. Continue with 30 min of aerobic exercise without signs/symptoms of physical distress.    Intensity THRR unchanged THRR unchanged THRR unchanged THRR unchanged      Progression   Progression Continue to  progress workloads to  maintain intensity without signs/symptoms of physical distress. Continue to progress workloads to maintain intensity without signs/symptoms of physical distress. Continue to progress workloads to maintain intensity without signs/symptoms of physical distress. Continue to progress workloads to maintain intensity without signs/symptoms of physical distress.    Average METs 5.17 8.35 10.62 7.05      Resistance Training   Training Prescription Yes Yes Yes Yes    Weight 5 lb 5 lb 5 lb 5 lb    Reps 10-15 10-15 10-15 10-15      Interval Training   Interval Training No No Yes No    Equipment -- -- Treadmill --    Comments -- -- walking/running- 38mh-8 mph --      Treadmill   MPH _0 intervals 5.5    Grade 3 9 2.5 2    Minutes _1 METs 5.72 9.1  interval running 16.2  average with intervals (457m-8mph) 6.73      Elliptical   Level _2 Speed 3 3._3 Minutes _4 METs -- 7.6 -- --      REL-XR   Level 8 -- -- 11    Minutes 15 -- -- 15    METs 5.2 -- -- 9.5      Home Exercise Plan   Plans to continue exercise at Home (comment)  walking, home gym Home (comment)  walking, home gym -- Home (comment)  walking, home gym    Frequency Add 2 additional days to program exercise sessions. Add 2 additional days to program exercise sessions. -- Add 2 additional days to program exercise sessions.    Initial Home Exercises Provided 09/27/21 09/27/21 -- 09/27/21      Oxygen   Maintain Oxygen Saturation 88% or higher 88% or higher 88% or higher 88% or higher             Exercise Comments:   Exercise Comments     Row Name 09/18/21 1002           Exercise Comments First full day of exercise!  Patient was oriented to gym and equipment including functions, settings, policies, and procedures.  Patient's individual exercise prescription and treatment plan were reviewed.  All starting workloads were established based on the results of the 6 minute walk test done  at initial orientation visit.  The plan for exercise progression was also introduced and progression will be customized based on patient's performance and goals.                Exercise Goals and Review:   Exercise Goals     Row Name 09/16/21 1720             Exercise Goals   Increase Physical Activity Yes       Intervention Provide advice, education, support and counseling about physical activity/exercise needs.;Develop an individualized exercise prescription for aerobic and resistive training based on initial evaluation findings, risk stratification, comorbidities and participant's personal goals.       Expected Outcomes Short Term: Attend rehab on a regular basis to increase amount of physical activity.;Long Term: Add in home exercise to make exercise part of routine and to increase amount of physical activity.;Long Term: Exercising regularly at least 3-5 days a week.       Increase Strength and Stamina Yes       Intervention Provide  advice, education, support and counseling about physical activity/exercise needs.;Develop an individualized exercise prescription for aerobic and resistive training based on initial evaluation findings, risk stratification, comorbidities and participant's personal goals.       Expected Outcomes Short Term: Increase workloads from initial exercise prescription for resistance, speed, and METs.;Short Term: Perform resistance training exercises routinely during rehab and add in resistance training at home;Long Term: Improve cardiorespiratory fitness, muscular endurance and strength as measured by increased METs and functional capacity (6MWT)       Able to understand and use rate of perceived exertion (RPE) scale Yes       Intervention Provide education and explanation on how to use RPE scale       Expected Outcomes Short Term: Able to use RPE daily in rehab to express subjective intensity level;Long Term:  Able to use RPE to guide intensity level when  exercising independently       Able to understand and use Dyspnea scale Yes       Intervention Provide education and explanation on how to use Dyspnea scale       Expected Outcomes Short Term: Able to use Dyspnea scale daily in rehab to express subjective sense of shortness of breath during exertion;Long Term: Able to use Dyspnea scale to guide intensity level when exercising independently       Knowledge and understanding of Target Heart Rate Range (THRR) Yes       Intervention Provide education and explanation of THRR including how the numbers were predicted and where they are located for reference       Expected Outcomes Short Term: Able to state/look up THRR;Long Term: Able to use THRR to govern intensity when exercising independently;Short Term: Able to use daily as guideline for intensity in rehab       Able to check pulse independently Yes       Intervention Provide education and demonstration on how to check pulse in carotid and radial arteries.;Review the importance of being able to check your own pulse for safety during independent exercise       Expected Outcomes Short Term: Able to explain why pulse checking is important during independent exercise;Long Term: Able to check pulse independently and accurately       Understanding of Exercise Prescription Yes       Intervention Provide education, explanation, and written materials on patient's individual exercise prescription       Expected Outcomes Short Term: Able to explain program exercise prescription;Long Term: Able to explain home exercise prescription to exercise independently                Exercise Goals Re-Evaluation :  Exercise Goals Re-Evaluation     Row Name 09/18/21 1002 09/23/21 1614 09/27/21 1016 10/10/21 0705 10/22/21 0837     Exercise Goal Re-Evaluation   Exercise Goals Review Increase Physical Activity;Able to understand and use rate of perceived exertion (RPE) scale;Knowledge and understanding of Target Heart  Rate Range (THRR);Understanding of Exercise Prescription;Able to understand and use Dyspnea scale;Able to check pulse independently;Increase Strength and Stamina Increase Physical Activity;Increase Strength and Stamina;Understanding of Exercise Prescription Increase Physical Activity;Increase Strength and Stamina;Understanding of Exercise Prescription;Able to understand and use rate of perceived exertion (RPE) scale;Able to understand and use Dyspnea scale;Knowledge and understanding of Target Heart Rate Range (THRR);Able to check pulse independently Increase Physical Activity;Increase Strength and Stamina;Understanding of Exercise Prescription Increase Physical Activity;Increase Strength and Stamina;Understanding of Exercise Prescription   Comments Reviewed RPE and dyspnea scales, THR and  program prescription with pt today.  Pt voiced understanding and was given a copy of goals to take home. Jabir is off to a good start in rehab.  He has completed his first three full days in rehab.  He is already up to level 8 on the XR.  We will continue to monitor his progress. Reviewed home exercise with pt today.  Pt plans to walk his land at home for exercise.   He also has weights at home and has a friend that has a home gym he is able to use as well. Reviewed THR, pulse, RPE, sign and symptoms, pulse oximetery and when to call 911 or MD.  Also discussed weather considerations and indoor options.  Pt voiced understanding. Steaven has been doing well in rehab. He increased his average MET level to 5.40 METs. He also increased his speed on the treadmill to 3.9 mph and his grade to 2.5%. He has also consistently hit his THR zone during exercise. He tolerated using 5 lb weights for resistance training. We will continue to monitor Thayne's progress in the program. Payton continues to do well in rehab.  He has missed a couple of visits with vacation and work.  He tried level 8 on the ellipitcal but has to reduce back to level 6.  We will  continue to monitor his progress.   Expected Outcomes Short: Use RPE daily to regulate intensity. Long: Follow program prescription in THR. Short: Continue to attend rehab regularly Long: Continue to follow program prescription Short: Start to add in more exercise at home Long: Continue to exercise independently Short: Increase weight for resistance training. Long: Continue to improve strength and stamina. Short: Return to regular attendance in rehab. Long: Continue to improve stamina    Row Name 11/04/21 1004 11/04/21 1010 11/18/21 1455 12/03/21 0948 12/11/21 1017     Exercise Goal Re-Evaluation   Exercise Goals Review Increase Physical Activity;Increase Strength and Stamina;Understanding of Exercise Prescription Increase Physical Activity;Increase Strength and Stamina;Understanding of Exercise Prescription Increase Physical Activity;Increase Strength and Stamina;Understanding of Exercise Prescription Increase Physical Activity;Increase Strength and Stamina;Understanding of Exercise Prescription Increase Physical Activity;Increase Strength and Stamina;Understanding of Exercise Prescription   Comments Lister is doing well in rehab. He recently improved to level 9 on the elliptical. He is back up to 4 mph on the treadmill as well. He has also tolerated 5 lb hand weights for resistance training. We will continue to monitor Esa's progress in the program. Naszir reports being physically active, but does not currently do any structured exercise outside of rehab. Reviewed the importance of structured exercise and encouraged him to add at least 2 days of this when not at rehab. He reports there is a senior center near his house that he might be able to use for free. Humza is doing well in rehab.  He is p to level 10 on the elliptical with intervals.  He is also starting to do some running intervals on the treadmill as well.  We will continue to montior his progress. Duard continues to do well. HE is now up to 68mh as his  max workload on the treadmill. He is doing intervals between 456m-8mph with a 2.5% incline and tolerates it well. He continues to work on the elliptical and has increased his speed to 7.0. He is very well reaching his THR. Will continue to monitor. Emit reports being physically active, but does not currently do much structured exercise outside of rehab. He reports that sometimes he will  walk or run outside - reminded him of safety exercising outdoors. Reviewed the importance of structured exercise and encouraged him to add at least 2 days of this when not at rehab. He reports there is a senior center near his house; he is going to look into that for exercise outside of rehab. He has a pulse oximeter at home to track his HR; reviewed THR 105-140.   Expected Outcomes Short: continue to attend rehab regularly. Long: Continue to improve strength and stamina. Short: begin structured exercise outside of rehab at least 2x/week Long: Continue to improve strength and stamina. Short: Try 6 lb weights and continue to use intervals Long: Continue to improve stamina Short: Continue with intervals on treadmill Long: Continue to increase overall MET level Short: begin structured exercise outside of rehab at least 2x/week Long: Continue to improve strength and stamina.    Stallings Name 12/16/21 1159             Exercise Goal Re-Evaluation   Exercise Goals Review Increase Physical Activity;Increase Strength and Stamina;Understanding of Exercise Prescription       Comments Maliki continues to do well in rehab. He recently improved to level 11 on both the elliptical and the XR. He has also been consistently working at 7 METs or higher. He has tolerated 5 lb hand weights for resistance training and may benefit from trying 6 lb. We will continue to monitor his progress in the program.       Expected Outcomes Short: Try 6 lb for resistance training. Long: Continue to improve strength and stamina.                Discharge  Exercise Prescription (Final Exercise Prescription Changes):  Exercise Prescription Changes - 12/16/21 1100       Response to Exercise   Blood Pressure (Admit) 124/82    Blood Pressure (Exit) 112/74    Heart Rate (Admit) 74 bpm    Heart Rate (Exercise) 137 bpm    Heart Rate (Exit) 80 bpm    Oxygen Saturation (Admit) 97 %    Oxygen Saturation (Exercise) 95 %    Oxygen Saturation (Exit) 97 %    Rating of Perceived Exertion (Exercise) 15    Symptoms none    Duration Continue with 30 min of aerobic exercise without signs/symptoms of physical distress.    Intensity THRR unchanged      Progression   Progression Continue to progress workloads to maintain intensity without signs/symptoms of physical distress.    Average METs 7.05      Resistance Training   Training Prescription Yes    Weight 5 lb    Reps 10-15      Interval Training   Interval Training No      Treadmill   MPH 5.5    Grade 2    Minutes 15    METs 6.73      Elliptical   Level 11    Speed 4    Minutes 15      REL-XR   Level 11    Minutes 15    METs 9.5      Home Exercise Plan   Plans to continue exercise at Home (comment)   walking, home gym   Frequency Add 2 additional days to program exercise sessions.    Initial Home Exercises Provided 09/27/21      Oxygen   Maintain Oxygen Saturation 88% or higher  Nutrition:  Target Goals: Understanding of nutrition guidelines, daily intake of sodium <1555m, cholesterol <2025m calories 30% from fat and 7% or less from saturated fats, daily to have 5 or more servings of fruits and vegetables.  Education: All About Nutrition: -Group instruction provided by verbal, written material, interactive activities, discussions, models, and posters to present general guidelines for heart healthy nutrition including fat, fiber, MyPlate, the role of sodium in heart healthy nutrition, utilization of the nutrition label, and utilization of this knowledge for meal  planning. Follow up email sent as well. Written material given at graduation. [facility]  Education need identified 09/16/21  Date 10/02/21  Educator [place] Review Code 1- Verbalizes Understanding       Biometrics:  Pre Biometrics - 09/16/21 1721       Pre Biometrics   Height _0  (1.753 m)    Weight 205 lb 4.8 oz (93.1 kg)    BMI (Calculated) 30.3    Single Leg Stand 30 seconds              Nutrition Therapy Plan and Nutrition Goals:  Nutrition Therapy & Goals - 09/30/21 1114       Nutrition Therapy   Diet Heart healthy, low Na    Protein (specify units) 75g    Fiber 30 grams    Whole Grain Foods 3 servings    Saturated Fats 16 max. grams    Fruits and Vegetables 8 servings/day    Sodium 2 grams      Personal Nutrition Goals   Nutrition Goal ST: review handouts, brainstorm more last minute meals, take note of the amount of salt eaten during the day LT: limit Na <2g/day, become prepared for last minute meals    Comments 89.o. M admitted to rehab s/p NSTEMI. PMHx includes past tobacco use, HLD, right lower lobe nodule, basal cell cancer. PSHx CABGx4 (2023). Relevant medications includes vit C, lipitor, vit D3, MVI (liver detox PO- unspecified name), testosterone cypionate. PYP Score: 66. Vegetables & Fruits 7/12. Breads, Grains & Cereals 7/12. Red & Processed Meat 10/12. Poultry 2/2. Fish & Shellfish 1/4. Beans, Nuts & Seeds 4/4. Milk & Dairy Foods 4/6. Toppings, Oils, Seasonings & Salt 13/20. Sweets, Snacks & Restaurant Food 8/14. Beverages 10/10. B: coffee black (sometimes with half and half), eggs with olive oil L: wife will make him something D: wife cooks - protein, starch, vegetable - usually chicken. Salads. When his wife cooks, he reports eating well and with a good variety; when she doesn't he will go out to eat and have less health promoting options. Discussed last minute meals and  versitile ingredients. Kailin reports using a lot of salt right now - encouraged to reduce slowly to make changes more sustainable. Discussed heart healthy eating.      Intervention Plan   Intervention Prescribe, educate and counsel regarding individualized specific dietary modifications aiming towards targeted core components such as weight, hypertension, lipid management, diabetes, heart failure and other comorbidities.    Expected Outcomes Short Term Goal: Understand basic principles of dietary content, such as calories, fat, sodium, cholesterol and nutrients.;Short Term Goal: A plan has been developed with personal nutrition goals set during dietitian appointment.;Long Term Goal: Adherence to prescribed nutrition plan.             Nutrition Assessments:  MEDIFICTS Score Key: ?70 Need to make dietary changes  40-70 Heart Healthy Diet ? 40 Therapeutic Level  Cholesterol Diet  Flowsheet Row Cardiac Rehab from 09/16/2021 in [facility] Cardiac and Pulmonary Rehab  Picture Your Plate Total Score on Admission 66      Picture Your Plate Scores: <79 Unhealthy dietary pattern with much room for improvement. 41-50 Dietary pattern unlikely to meet recommendations for good health and room for improvement. 51-60 More healthful dietary pattern, with some room for improvement.  >60 Healthy dietary pattern, although there may be some specific behaviors that could be improved.    Nutrition Goals Re-Evaluation:  Nutrition Goals Re-Evaluation     Angelica Name 10/16/21 1014 11/04/21 1015 12/11/21 1023         Goals   Current Weight 205 lb (93 kg) -- --     Nutrition Goal Cut back on snacking ST: try out recipes, take note of the amount of salt eaten during the day, pair peanuts with fruit LT: limit Na <2g/day, become prepared for last minute meals ST: continue with heart healthy changes LT: limit Na <2g/day, become prepared for last minute meals     Comment Quamir would like to cut back on his snacking. He  likes to eat peanut type snacks and other nuts. He states that peanuts are his one snack that he eats to much of. Informed him that peanuts and other nut snacks have high calories. He has tried out smoothies with basil, spinach, watermelon, chia seeds, beets, frozen banana. He felt great after having the smoothie and felt it better fueled his exercise. He is snacking on peanuts, but feels tha the may eat too many in one sitting. Advised pairing fruit with peanuts for a more balanced snack, encouraged to try cutting back on sodium as previously discussed. He has not tried out the recipes given by this RD, but plans to. Miguel reports doing well with his nutrition, he is including lots of variety, lean proteins, healthy fat, and fruits/vegetables. Most mornings he has been having oatmeal with chia seeds, frozen bananas, and sometimes eggs. He has been having shakes with different herbs, vegetables, and fruits. His wife normally cooks him dinner and has been making heart healthy meals. he has no questions at this time and reports no barriers to continuing heart healthy eating.     Expected Outcome Short: cut back on portions for snacking. Long: maintain a diet that adheres to him. ST: try out recipes, take note of the amount of salt eaten during the day, pair peanuts with fruit LT: limit Na <2g/day, become prepared for last minute meals ST: continue with heart healthy changes LT: limit Na <2g/day, become prepared for last minute meals              Nutrition Goals Discharge (Final Nutrition Goals Re-Evaluation):  Nutrition Goals Re-Evaluation - 12/11/21 1023       Goals   Nutrition Goal ST: continue with heart healthy changes LT: limit Na <2g/day, become prepared for last minute meals    Comment Claris reports doing well with his nutrition, he is including lots of variety, lean proteins, healthy fat, and fruits/vegetables. Most mornings he has been having oatmeal with chia seeds, frozen bananas, and sometimes  eggs. He has been having shakes with different herbs, vegetables, and fruits. His wife normally cooks him dinner and has been making heart healthy meals. he has no questions at this time and reports no barriers to continuing heart healthy eating.    Expected Outcome ST: continue with heart healthy changes LT: limit Na <2g/day, become prepared for last minute meals  Psychosocial: Target Goals: Acknowledge presence or absence of significant depression and/or stress, maximize coping skills, provide positive support system. Participant is able to verbalize types and ability to use techniques and skills needed for reducing stress and depression.   Education: Stress, Anxiety, and Depression - Group verbal and visual presentation to define topics covered.  Reviews how body is impacted by stress, anxiety, and depression.  Also discusses healthy ways to reduce stress and to treat/manage anxiety and depression.  Written material given at graduation. Flowsheet Row Cardiac Rehab from 12/11/2021 in [facility] Cardiac and [facility] Rehab  Date 10/30/21  Educator [facility]  Instruction Review Code 1- [employer] Understanding       Education: Sleep Hygiene -Provides group verbal and written instruction about how sleep can affect your health.  Define sleep hygiene, discuss sleep cycles and impact of sleep habits. Review good sleep hygiene tips.    Initial Review & Psychosocial Screening:  Initial Psych Review & Screening - 09/11/21 1021       Initial Review   Current issues with None Identified      Family Dynamics   Good Support System? Yes   wife,children. church family     Barriers   Psychosocial barriers to participate in program There are no identifiable barriers or psychosocial needs.      Screening Interventions   Interventions Encouraged to exercise    Expected Outcomes Short Term goal: Utilizing psychosocial counselor, staff and physician to assist with identification of specific  Stressors or current issues interfering with healing process. Setting desired goal for each stressor or current issue identified.;Long Term Goal: Stressors or current issues are controlled or eliminated.;Short Term goal: Identification and review with participant of any Quality of Life or Depression concerns found by scoring the questionnaire.;Long Term goal: The participant improves quality of Life and PHQ9 Scores as seen by post scores and/or verbalization of changes             Quality of Life Scores:   Quality of Life - 09/16/21 1736       Quality of Life   Select Quality of Life      Quality of Life Scores   Health/Function Pre 23.9 %    Socioeconomic Pre 27.36 %    Psych/Spiritual Pre 24.21 %    Family Pre 25.2 %    GLOBAL Pre 24.87 %            Scores of 19 and below usually indicate a poorer quality of life in these areas.  A difference of  2-3 points is a clinically meaningful difference.  A difference of 2-3 points in the total score of the Quality of Life Index has been associated with significant improvement in overall quality of life, self-image, physical symptoms, and general health in studies assessing change in quality of life.  PHQ-9: Review Flowsheet       09/16/2021  Depression screen PHQ 2/9  Decreased Interest 0  Down, Depressed, Hopeless 0  PHQ - 2 Score 0  Altered sleeping 0  Tired, decreased energy 1  Change in appetite 1  Feeling bad or failure about yourself  0  Trouble concentrating 0  Moving slowly or fidgety/restless 0  Suicidal thoughts 0  PHQ-9 Score 2   Interpretation of Total Score  Total Score Depression Severity:  1-4 = Minimal depression, 5-9 = Mild depression, 10-14 = Moderate depression, 15-19 = Moderately severe depression, 20-27 = Severe depression   Psychosocial Evaluation and Intervention:  Psychosocial Evaluation - 09/11/21  1045       Psychosocial Evaluation & Interventions   Interventions Encouraged to exercise with the  program and follow exercise prescription    Comments Sakari has no barriers to attending the program. He lives with his wife. HIs wife,  his children,granchildren and his church family are all there for support. His goal is to continue to heal and learn all he can to maintain a healthy lifestyle.  He is ready to get started.    Expected Outcomes STG Cicero attends all scheduled exercise and education sessions,LTG Patrik ontinues to progress his exercise regimen and utilizes what he learned in the program to help him maintain a healty lifestyle    Continue Psychosocial Services  Follow up required by staff             Psychosocial Re-Evaluation:  Psychosocial Re-Evaluation     Pima Name 10/16/21 1012 11/04/21 1018 12/11/21 1047         Psychosocial Re-Evaluation   Current issues with None Identified None Identified None Identified     Comments Patient reports no issues with their current mental states, sleep, stress, depression or anxiety. Will follow up with patient in a few weeks for any changes. He is a man of faith and goes to church regularly. Patient reports no issues with their current mental states, sleep, stress, depression or anxiety. Will follow up with patient in a few weeks for any changes. He is a man of faith and goes to church regularly. He feels like he can rely on the church and his wife for support. He reports that when stress arises he practices deep breathing to combat it. Mina reports that he is doing well mentally and has no major stressors at this time. Will focontinue to follow up with patient for changes. He reports that he is a man of faith and goes to church regularly. He feels like he can rely on the church and his wife for support. He reports that when stress arises he practices deep breathing to combat it. The only stress he has is occassionally from work. He reports sleeping well.     Expected Outcomes Short: Continue to exercise regularly to support mental health and  notify staff of any changes. Long: maintain mental health and well being through teaching of rehab or prescribed medications independently. Short: Continue to exercise regularly to support mental health and notify staff of any changes. Long: maintain mental health and well being through teaching of rehab or prescribed medications independently. Short: Continue to exercise regularly to support mental health and notify staff of any changes. Long: maintain mental health and well being through teaching of rehab or prescribed medications independently.     Interventions Encouraged to attend Cardiac Rehabilitation for the exercise Encouraged to attend Cardiac Rehabilitation for the exercise Encouraged to attend Cardiac Rehabilitation for the exercise     Continue Psychosocial Services  Follow up required by staff Follow up required by staff Follow up required by staff              Psychosocial Discharge (Final Psychosocial Re-Evaluation):  Psychosocial Re-Evaluation - 12/11/21 1047       Psychosocial Re-Evaluation   Current issues with None Identified    Comments Bach reports that he is doing well mentally and has no major stressors at this time. Will focontinue to follow up with patient for changes. He reports that he is a man of faith and goes to church regularly. He feels like he can rely  on the church and his wife for support. He reports that when stress arises he practices deep breathing to combat it. The only stress he has is occassionally from work. He reports sleeping well.    Expected Outcomes Short: Continue to exercise regularly to support mental health and notify staff of any changes. Long: maintain mental health and well being through teaching of rehab or prescribed medications independently.    Interventions Encouraged to attend Cardiac Rehabilitation for the exercise    Continue Psychosocial Services  Follow up required by staff             Vocational Rehabilitation: Provide  vocational rehab assistance to qualifying candidates.   Vocational Rehab Evaluation & Intervention:   Education: Education Goals: Education classes will be provided on a variety of topics geared toward better understanding of heart health and risk factor modification. Participant will state understanding/return demonstration of topics presented as noted by education test scores.  Learning Barriers/Preferences:   General Cardiac Education Topics:  AED/CPR: - Group verbal and written instruction with the use of models to demonstrate the basic use of the AED with the basic ABC's of resuscitation.   Anatomy and Cardiac Procedures: - Group verbal and visual presentation and models provide information about basic cardiac anatomy and function. Reviews the testing methods done to diagnose heart disease and the outcomes of the test results. Describes the treatment choices: Medical Management, Angioplasty, or Coronary Bypass Surgery for treating various heart conditions including Myocardial Infarction, Angina, Valve Disease, and Cardiac Arrhythmias.  Written material given at graduation. Flowsheet Row Cardiac Rehab from 12/11/2021 in [facility] Cardiac and Pulmonary Rehab  Education need identified 09/16/21  Date 09/18/21  Educator SB  Instruction Review Code 1- Verbalizes Understanding       Medication Safety: - Group verbal and visual instruction to review commonly prescribed medications for heart and lung disease. Reviews the medication, class of the drug, and side effects. Includes the steps to properly store meds and maintain the prescription regimen.  Written material given at graduation. Flowsheet Row Cardiac Rehab from 12/11/2021 in [facility] Cardiac and Pulmonary Rehab  Date 12/11/21  Educator SB  Instruction Review Code 1- Verbalizes Understanding       Intimacy: - Group verbal instruction through game format to discuss how heart and lung disease can affect sexual intimacy. Written material  given at graduation..   Know Your Numbers and Heart Failure: - Group verbal and visual instruction to discuss disease risk factors for cardiac and pulmonary disease and treatment options.  Reviews associated critical values for Overweight/Obesity, Hypertension, Cholesterol, and Diabetes.  Discusses basics of heart failure: signs/symptoms and treatments.  Introduces Heart Failure Zone chart for action plan for heart failure.  Written material given at graduation.   Infection Prevention: - Provides verbal and written material to individual with discussion of infection control including proper hand washing and proper equipment cleaning during exercise session. Flowsheet Row Cardiac Rehab from 12/11/2021 in [facility] Cardiac and Pulmonary Rehab  Date 09/16/21  Educator [facility]  Instruction Review Code 1- Verbalizes Understanding       Falls Prevention: - Provides verbal and written material to individual with discussion of falls prevention and safety. Flowsheet Row Cardiac Rehab from 12/11/2021 in [facility] Cardiac and Pulmonary Rehab  Date 09/16/21  Educator [facility]  Instruction Review Code 1- Verbalizes Understanding       Other: -Provides group and verbal instruction on various topics (see comments)   Knowledge Questionnaire Score:  Knowledge Questionnaire Score - 09/16/21 1737  Knowledge Questionnaire Score   Pre Score 22/26             Core Components/Risk Factors/Patient Goals at Admission:  Personal Goals and Risk Factors at Admission - 09/16/21 1734       Core Components/Risk Factors/Patient Goals on Admission    Weight Management Yes;Weight Loss    Intervention Weight Management: Develop a combined nutrition and exercise program designed to reach desired caloric intake, while maintaining appropriate intake of nutrient and fiber, sodium and fats, and appropriate energy expenditure required for the weight goal.;Weight Management: Provide education and appropriate resources to help  participant work on and attain dietary goals.;Weight Management/Obesity: Establish reasonable short term and long term weight goals.    Admit Weight 205 lb 4.8 oz (93.1 kg)    Goal Weight: Short Term 200 lb (90.7 kg)    Goal Weight: Long Term 186 lb (84.4 kg)    Expected Outcomes Short Term: Continue to assess and modify interventions until short term weight is achieved;Weight Loss: Understanding of general recommendations for a balanced deficit meal plan, which promotes 1-2 lb weight loss per week and includes a negative energy balance of [phone] kcal/d;Understanding of distribution of calorie intake throughout the day with the consumption of 4-5 meals/snacks;Long Term: Adherence to nutrition and physical activity/exercise program aimed toward attainment of established weight goal;Understanding recommendations for meals to include 15-35% energy as protein, 25-35% energy from fat, 35-60% energy from carbohydrates, less than 237m of dietary cholesterol, 20-35 gm of total fiber daily    Lipids Yes    Intervention Provide education and support for participant on nutrition & aerobic/resistive exercise along with prescribed medications to achieve LDL <71m HDL >4056m   Expected Outcomes Short Term: Participant states understanding of desired cholesterol values and is compliant with medications prescribed. Participant is following exercise prescription and nutrition guidelines.;Long Term: Cholesterol controlled with medications as prescribed, with individualized exercise RX and with personalized nutrition plan. Value goals: LDL < 17m49mDL > 40 mg.             Education:Diabetes - Individual verbal and written instruction to review signs/symptoms of diabetes, desired ranges of glucose level fasting, after meals and with exercise. Acknowledge that pre and post exercise glucose checks will be done for 3 sessions at entry of program.   Core Components/Risk Factors/Patient Goals Review:   Goals and  Risk Factor Review     Row Name 10/16/21 1006 11/04/21 1013 12/11/21 1023         Core Components/Risk Factors/Patient Goals Review   Personal Goals Review Weight Management/Obesity Weight Management/Obesity;Lipids Weight Management/Obesity;Lipids     Review JohnLukaszld like to lose a few pounds. He needs to weed out some snacks that can be attrributing to his weight maintaining. His most likable snack is peanuts and states he may eat to much of them. Informed him that peanuts and nuts in general have alot of calories. Xavian reports it has been stable, fluctuating a couple of pounds. He reports that he got blood work last week and his cholesterol is still high. He is starting semaglutide per his doctors recommendations for this. He continues to work on diet and exercise. Rishit reports it has been stable, fluctuating a couple of pounds; he reports since starting rehab it has been around 205lbs. He has been tolerating semaglutide with no issues and will go up in his dose soon. He is taking all of his medications as directed with no issues. He continues to work on diet  and exercise.     Expected Outcomes Short: eat less peanuts. Long: reach weight goal. ST: start taking semaglutide and monitor for side effects LT: continue to monitor risk factors ST: when he increases semaglutide continue to monitor for side effects LT: continue to monitor risk factors              Core Components/Risk Factors/Patient Goals at Discharge (Final Review):   Goals and Risk Factor Review - 12/11/21 1023       Core Components/Risk Factors/Patient Goals Review   Personal Goals Review Weight Management/Obesity;Lipids    Review Munachimso reports it has been stable, fluctuating a couple of pounds; he reports since starting rehab it has been around 205lbs. He has been tolerating semaglutide with no issues and will go up in his dose soon. He is taking all of his medications as directed with no issues. He continues to work on diet  and exercise.    Expected Outcomes ST: when he increases semaglutide continue to monitor for side effects LT: continue to monitor risk factors             ITP Comments:  ITP Comments     Row Name 09/11/21 1103 09/16/21 1714 09/18/21 1002 09/25/21 0815 09/30/21 1145   ITP Comments Virtual orientation call completed today. he has an appointment on Date: 09/16/2021  for EP eval and gym Orientation.  Documentation of diagnosis [facility] be found in [facility] Date: 08/01/2021 . Completed 6MWT and gym orientation. Initial ITP created and sent for review to Dr. Emily Filbert, Medical Director. First full day of exercise!  Patient was oriented to gym and equipment including functions, settings, policies, and procedures.  Patient's individual exercise prescription and treatment plan were reviewed.  All starting workloads were established based on the results of the 6 minute walk test done at initial orientation visit.  The plan for exercise progression was also introduced and progression will be customized based on patient's performance and goals. 30 Day review completed. Medical Director ITP review done, changes made as directed, and signed approval by Medical Director.     New Completed initial RD consultation    Row Name 10/23/21 0936 11/20/21 0749 12/18/21 0651       ITP Comments 30 Day review completed. Medical Director ITP review done, changes made as directed, and signed approval by Medical Director. 30 Day review completed. Medical Director ITP review done, changes made as directed, and signed approval by Medical Director. 30 Day review completed. Medical Director ITP review done, changes made as directed, and signed approval by Medical Director.              Comments:

## 2021-12-23 ENCOUNTER — Encounter: Payer: Self-pay | Admitting: *Deleted

## 2021-12-23 VITALS — Ht 69.0 in | Wt 203.2 lb

## 2021-12-23 DIAGNOSIS — Z951 Presence of aortocoronary bypass graft: Secondary | ICD-10-CM

## 2021-12-23 DIAGNOSIS — I214 Non-ST elevation (NSTEMI) myocardial infarction: Secondary | ICD-10-CM

## 2021-12-23 NOTE — Patient Instructions (Signed)
Discharge Patient Instructions  Patient Details  Name: Antonio [name] MRN: [id number] Date of Birth: 1960/11/30 Referring Provider:  Bryson Corona, NP   Number of Visits: 36  Reason for Discharge:  Patient reached a stable level of exercise. Patient independent in their exercise. Patient has met program and personal goals.  Smoking History:  Social History   Tobacco Use  Smoking Status Former   Packs/day: 1.00   Years: 16.00   Total pack years: 16.00   Types: Cigarettes   Quit date: 05/13/1991   Years since quitting: 30.6  Smokeless Tobacco Never  Tobacco Comments   Quit 30 years ago    Diagnosis:  NSTEMI (non-ST elevation myocardial infarction) ([place])  S/P CABG x 4  Initial Exercise Prescription:  Initial Exercise Prescription - 09/17/21 0700       Date of Initial Exercise RX and Referring Provider   Date 09/16/21    Referring Provider Skains      Oxygen   Maintain Oxygen Saturation 88% or higher      Treadmill   MPH 3.5    Grade 1    Minutes 15    METs 4.16      Recumbant Elliptical   Level 2    RPM 50    Minutes 15    METs 4.2      Elliptical   Level 1    Speed 3    Minutes 15    METs 4.2      REL-XR   Level 3    Speed 50    Minutes 15    METs 4.2      Prescription Details   Frequency (times per week) 3      Intensity   THRR 40-80% of Max Heartrate 105-140    Ratings of Perceived Exertion 11-13    Perceived Dyspnea 0-4      Progression   Progression Continue to progress workloads to maintain intensity without signs/symptoms of physical distress.      Resistance Training   Training Prescription Yes    Weight 5    Reps 10-15             Discharge Exercise Prescription (Final Exercise Prescription Changes):  Exercise Prescription Changes - 12/16/21 1100       Response to Exercise   Blood Pressure (Admit) 124/82    Blood Pressure (Exit) 112/74    Heart Rate (Admit) 74 bpm    Heart Rate (Exercise) 137 bpm    Heart Rate  (Exit) 80 bpm    Oxygen Saturation (Admit) 97 %    Oxygen Saturation (Exercise) 95 %    Oxygen Saturation (Exit) 97 %    Rating of Perceived Exertion (Exercise) 15    Symptoms none    Duration Continue with 30 min of aerobic exercise without signs/symptoms of physical distress.    Intensity THRR unchanged      Progression   Progression Continue to progress workloads to maintain intensity without signs/symptoms of physical distress.    Average METs 7.05      Resistance Training   Training Prescription Yes    Weight 5 lb    Reps 10-15      Interval Training   Interval Training No      Treadmill   MPH 5.5    Grade 2    Minutes 15    METs 6.73      Elliptical   Level 11    Speed 4    Minutes  15      REL-XR   Level 11    Minutes 15    METs 9.5      Home Exercise Plan   Plans to continue exercise at Home (comment)   walking, home gym   Frequency Add 2 additional days to program exercise sessions.    Initial Home Exercises Provided 09/27/21      Oxygen   Maintain Oxygen Saturation 88% or higher             Functional Capacity:  6 Minute Walk     Row Name 09/16/21 1714 12/23/21 1038       6 Minute Walk   Phase Initial Discharge    Distance 1760 feet 2055 feet    Distance % Change -- 16.7 %    Distance Feet Change -- 295 ft    Walk Time 6 minutes 6 minutes    # of Rest Breaks 0 0    MPH 3.33 3.89    METS 4.2 4.77    RPE 12 13    Perceived Dyspnea  0 0    VO2 Peak 14.69 16.71    Symptoms No No    Resting HR 71 bpm 80 bpm    Resting BP 104/70 118/76    Resting Oxygen Saturation  96 % 97 %    Exercise Oxygen Saturation  during 6 min walk 96 % 96 %    Max Ex. HR 82 bpm 119 bpm    Max Ex. BP 134/70 138/62    2 Minute Post BP 124/72 120/60              Nutrition & Weight - Outcomes:  Pre Biometrics - 09/16/21 1721       Pre Biometrics   Height $Remov'5\' 9"'bbublN$  (1.753 m)    Weight 205 lb 4.8 oz (93.1 kg)    BMI (Calculated) 30.3    Single Leg Stand 30  seconds             Post Biometrics - 12/23/21 1040        Post  Biometrics   Height $Remov'5\' 9"'gsrJko$  (1.753 m)    Weight 203 lb 3.2 oz (92.2 kg)    BMI (Calculated) 29.99    Single Leg Stand 30 seconds             Nutrition:  Nutrition Therapy & Goals - 09/30/21 1114       Nutrition Therapy   Diet Heart healthy, low Na    Protein (specify units) 75g    Fiber 30 grams    Whole Grain Foods 3 servings    Saturated Fats 16 max. grams    Fruits and Vegetables 8 servings/day    Sodium 2 grams      Personal Nutrition Goals   Nutrition Goal ST: review handouts, brainstorm more last minute meals, take note of the amount of salt eaten during the day LT: limit Na <2g/day, become prepared for last minute meals    Comments 61 y.o. M admitted to rehab s/p NSTEMI. PMHx includes past tobacco use, HLD, right lower lobe nodule, basal cell cancer. PSHx CABGx4 (2023). Relevant medications includes vit C, lipitor, vit D3, MVI (liver detox PO- unspecified name), testosterone cypionate. PYP Score: 66. Vegetables & Fruits 7/12. Breads, Grains & Cereals 7/12. Red & Processed Meat 10/12. Poultry 2/2. Fish & Shellfish 1/4. Beans, Nuts & Seeds 4/4. Milk & Dairy Foods 4/6. Toppings, Oils, Seasonings & Salt 13/20. Sweets, [place]  8/14. Beverages 10/10. B: coffee black (sometimes with half and half), eggs with olive oil L: wife will make him something D: wife cooks - protein, starch, vegetable - usually chicken. Salads. When his wife cooks, he reports eating well and with a good variety; when she doesn't he will go out to eat and have less health promoting options. Discussed last minute meals and versitile ingredients. Rayshun reports using a lot of salt right now - encouraged to reduce slowly to make changes more sustainable. Discussed heart healthy eating.      Intervention Plan   Intervention Prescribe, educate and counsel regarding individualized specific dietary modifications aiming towards  targeted core components such as weight, hypertension, lipid management, diabetes, heart failure and other comorbidities.    Expected Outcomes Short Term Goal: Understand basic principles of dietary content, such as calories, fat, sodium, cholesterol and nutrients.;Short Term Goal: A plan has been developed with personal nutrition goals set during dietitian appointment.;Long Term Goal: Adherence to prescribed nutrition plan.               Goals reviewed with patient; copy given to patient.

## 2021-12-23 NOTE — Progress Notes (Signed)
Daily Session Note  Patient Details  Name: Antonio [name] MRN: [id number] Date of Birth: 10-14-1960 Referring Provider:   Flowsheet Row Cardiac Rehab from 09/16/2021 in [facility] Cardiac and Pulmonary Rehab  Referring Provider Skains       Encounter Date: 12/23/2021  Check In:  Session Check In - 12/23/21 1001       Check-In   Supervising physician immediately available to respond to emergencies See telemetry face sheet for immediately available ER MD    Location [facility]-Cardiac & Pulmonary Rehab    Staff Present Carson Myrtle, BS, RRT, CPFT;Gladys Deckard Sherryll Burger, RN Moises Blood, BS, ACSM CEP, Exercise Physiologist    Virtual Visit No    Medication changes reported     No    Fall or balance concerns reported    No    Warm-up and Cool-down Performed on first and last piece of equipment    Resistance Training Performed Yes    VAD Patient? No    PAD/SET Patient? No      Pain Assessment   Currently in Pain? No/denies                Social History   Tobacco Use  Smoking Status Former   Packs/day: 1.00   Years: 16.00   Total pack years: 16.00   Types: Cigarettes   Quit date: 05/13/1991   Years since quitting: 30.6  Smokeless Tobacco Never  Tobacco Comments   Quit 30 years ago    Goals Met:  Independence with exercise equipment Exercise tolerated well No report of concerns or symptoms today Strength training completed today  Goals Unmet:  Not Applicable  Comments: Pt able to follow exercise prescription today without complaint.  Will continue to monitor for progression.    Dr. Emily Filbert is Medical Director for [place].  Dr. Ottie Glazier is Medical Director for [facility] Rehabilitation.

## 2021-12-24 ENCOUNTER — Telehealth: Payer: Self-pay | Admitting: Licensed Clinical Social Worker

## 2021-12-24 NOTE — Telephone Encounter (Signed)
H&V Care Navigation CSW Progress Note  Clinical Social Worker contacted patient by phone to f/u after appt with [employer] office and complete SDOH screening. No answer at [phone], left voicemail requesting call back. Will reattempt as able. Pt currently with no listed insurance, past bills.   Patient is participating in a Managed Medicaid Plan:  No, self pay only   SDOH Screenings   Alcohol Screen: Not on file  Depression (PHQ2-9): Low Risk  (09/16/2021)   Depression (PHQ2-9)    PHQ-2 Score: 2  Financial Resource Strain: Not on file  Food Insecurity: Not on file  Housing: Not on file  Physical Activity: Not on file  Social Connections: Not on file  Stress: Not on file  Tobacco Use: Medium Risk (12/16/2021)   Patient History    Smoking Tobacco Use: Former    Smokeless Tobacco Use: Never    Passive Exposure: Not on file  Transportation Needs: Not on file   Westley Hummer, MSW, LCSW Clinical Social Worker II [place]  [phone]- work cell phone (preferred) [phone]- desk phone

## 2021-12-25 ENCOUNTER — Telehealth: Payer: Self-pay | Admitting: Licensed Clinical Social Worker

## 2021-12-25 ENCOUNTER — Encounter: Payer: Self-pay | Admitting: *Deleted

## 2021-12-25 DIAGNOSIS — Z951 Presence of aortocoronary bypass graft: Secondary | ICD-10-CM

## 2021-12-25 DIAGNOSIS — I214 Non-ST elevation (NSTEMI) myocardial infarction: Secondary | ICD-10-CM

## 2021-12-25 NOTE — Progress Notes (Signed)
Daily Session Note  Patient Details  Name: ASCHER SCHROEPFER MRN: [id number] Date of Birth: 26-May-1960 Referring Provider:   Flowsheet Row Cardiac Rehab from 09/16/2021 in [facility] Cardiac [facility]monary Rehab  Referring Provider Skains       Encounter Date: 12/25/2021  Check In:  Session Check In - 12/25/21 1012       Check-In   Supervising physician immediately available to respond to emergencies See telemetry face sheet for immediately available ER MD    Location [facility]-Cardiac & Pulmonary Rehab    Staff Present Heath Lark, RN, BSN, CCRP;Jessica [place], MA, RCEP, CCRP, CCET;Noah Tickle, BS, Exercise Physiologist    Virtual Visit No    Medication changes reported     No    Fall or balance concerns reported    No    Warm-up and Cool-down Performed on first and last piece of equipment    Resistance Training Performed Yes    VAD Patient? No    PAD/SET Patient? No      Pain Assessment   Currently in Pain? No/denies                Social History   Tobacco Use  Smoking Status Former   Packs/day: 1.00   Years: 16.00   Total pack years: 16.00   Types: Cigarettes   Quit date: 05/13/1991   Years since quitting: 30.6  Smokeless Tobacco Never  Tobacco Comments   Quit 30 years ago    Goals Met:  Independence with exercise equipment Exercise tolerated well No report of concerns or symptoms today  Goals Unmet:  Not Applicable  Comments: Pt able to follow exercise prescription today without complaint.  Will continue to monitor for progression.    Dr. Emily Filbert is Medical Director for [place].  Dr. Ottie Glazier is Medical Director for [facility] Pulmonary Rehabilitation.

## 2021-12-25 NOTE — Telephone Encounter (Signed)
H&V Care Navigation CSW Progress Note  Clinical Social Worker contacted patient by phone to f/u after appt with [employer] office and complete SDOH screening. No answer again at [phone], left 2nd voicemail requesting call back. Will reattempt as able. Pt currently with no listed insurance, past bills.    Patient is participating in a Managed Medicaid Plan:  No, self pay only     SDOH Screenings   Alcohol Screen: Not on file  Depression (PHQ2-9): Low Risk  (09/16/2021)   Depression (PHQ2-9)    PHQ-2 Score: 2  Financial Resource Strain: Not on file  Food Insecurity: Not on file  Housing: Not on file  Physical Activity: Not on file  Social Connections: Not on file  Stress: Not on file  Tobacco Use: Medium Risk (12/16/2021)   Patient History    Smoking Tobacco Use: Former    Smokeless Tobacco Use: Never    Passive Exposure: Not on file  Transportation Needs: Not on file    Westley Hummer, MSW, LCSW Clinical Social Worker II [place]  [phone]- work cell phone (preferred) [phone]- desk phone

## 2021-12-27 ENCOUNTER — Encounter: Payer: Self-pay | Admitting: *Deleted

## 2021-12-27 DIAGNOSIS — Z951 Presence of aortocoronary bypass graft: Secondary | ICD-10-CM

## 2021-12-27 DIAGNOSIS — I214 Non-ST elevation (NSTEMI) myocardial infarction: Secondary | ICD-10-CM

## 2021-12-27 NOTE — Progress Notes (Signed)
Daily Session Note  Patient Details  Name: Antonio [name] MRN: [id number] Date of Birth: 01/25/1961 Referring Provider:   Flowsheet Row Cardiac Rehab from 09/16/2021 in [facility] Cardiac and Pulmonary Rehab  Referring Provider Skains       Encounter Date: 12/27/2021  Check In:  Session Check In - 12/27/21 0950       Check-In   Supervising physician immediately available to respond to emergencies See telemetry face sheet for immediately available ER MD    Location [facility] & Pulmonary Rehab    Staff Present Jessica Hawkins, MA, RCEP, CCRP, CCET;Joseph Hood, RCP,RRT,BSRT;Meredith Craven, RN BSN    Virtual Visit No    Medication changes reported     No    Fall or balance concerns reported    No    Warm-up and Cool-down Performed on first and last piece of equipment    Resistance Training Performed Yes    VAD Patient? No    PAD/SET Patient? No      Pain Assessment   Currently in Pain? No/denies                Social History   Tobacco Use  Smoking Status Former   Packs/day: 1.00   Years: 16.00   Total pack years: 16.00   Types: Cigarettes   Quit date: 05/13/1991   Years since quitting: 30.6  Smokeless Tobacco Never  Tobacco Comments   Quit 30 years ago    Goals Met:  Independence with exercise equipment Exercise tolerated well No report of concerns or symptoms today Strength training completed today  Goals Unmet:  Not Applicable  Comments: Pt able to follow exercise prescription today without complaint.  Will continue to monitor for progression.    Dr. Mark Miller is Medical Director for [facility].  Dr. Fuad Aleskerov is Medical Director for [facility] Pulmonary Rehabilitation. 

## 2021-12-30 ENCOUNTER — Telehealth: Payer: Self-pay | Admitting: Licensed Clinical Social Worker

## 2021-12-30 ENCOUNTER — Encounter: Payer: Self-pay | Admitting: *Deleted

## 2021-12-30 DIAGNOSIS — I214 Non-ST elevation (NSTEMI) myocardial infarction: Secondary | ICD-10-CM

## 2021-12-30 DIAGNOSIS — Z951 Presence of aortocoronary bypass graft: Secondary | ICD-10-CM

## 2021-12-30 NOTE — Progress Notes (Signed)
Daily Session Note  Patient Details  Name: Antonio [name] MRN: [id number] Date of Birth: 16-Dec-1960 Referring Provider:   Flowsheet Row Cardiac Rehab from 09/16/2021 in [facility] Cardiac and Pulmonary Rehab  Referring Provider Skains       Encounter Date: 12/30/2021  Check In:  Session Check In - 12/30/21 1046       Check-In   Supervising physician immediately available to respond to emergencies See telemetry face sheet for immediately available ER MD    Location [facility] Pulmonary Re[facility]    Staff Present Heath Lark, RN, BSN, Laveda Norman, BS, ACSM CEP, Exercise Physiologist;Joseph [place], [place]    Virtual Visit No    Medication changes reported     No    Fall or balance concerns reported    No    Warm-up and Cool-down Performed on first and last piece of equipment    Resistance Training Performed Yes    VAD Patient? No    PAD/SET Patient? No      Pain Assessment   Currently in Pain? No/denies                Social History   Tobacco Use  Smoking Status Former   Packs/day: 1.00   Years: 16.00   Total pack years: 16.00   Types: Cigarettes   Quit date: 05/13/1991   Years since quitting: 30.6  Smokeless Tobacco Never  Tobacco Comments   Quit 30 years ago    Goals Met:  Independence with exercise equipment Exercise tolerated well No report of concerns or symptoms today  Goals Unmet:  Not Applicable  Comments: Pt able to follow exercise prescription today without complaint.  Will continue to monitor for progression.    Dr. Emily Filbert is Medical Director for [name].  Dr. Ottie Glazier is Medical Director for [name] Pulmonary Rehabilitation.

## 2021-12-30 NOTE — Telephone Encounter (Signed)
H&V Care Navigation CSW Progress Note  Clinical Social Worker contacted patient by phone to f/u after appt with [employer] office and complete SDOH screening. No answer again at [phone], left 3rd voicemail requesting call back. Pt currently with no listed insurance, past bills. I will mail him a copy of the [employer]  and my card. I remain available.    Patient is participating in a Managed Medicaid Plan:  No, self pay only   SDOH Screenings   Alcohol Screen: Not on file  Depression (PHQ2-9): Low Risk  (09/16/2021)   Depression (PHQ2-9)    PHQ-2 Score: 2  Financial Resource Strain: Not on file  Food Insecurity: Not on file  Housing: Not on file  Physical Activity: Not on file  Social Connections: Not on file  Stress: Not on file  Tobacco Use: Medium Risk (12/16/2021)   Patient History    Smoking Tobacco Use: Former    Smokeless Tobacco Use: Never    Passive Exposure: Not on file  Transportation Needs: Not on file    Westley Hummer, MSW, LCSW Clinical Social Worker II [place]  [phone]- work cell phone (preferred) [phone]- desk phone

## 2022-01-01 ENCOUNTER — Encounter: Payer: Self-pay | Admitting: *Deleted

## 2022-01-01 DIAGNOSIS — Z951 Presence of aortocoronary bypass graft: Secondary | ICD-10-CM

## 2022-01-01 DIAGNOSIS — I214 Non-ST elevation (NSTEMI) myocardial infarction: Secondary | ICD-10-CM

## 2022-01-01 NOTE — Progress Notes (Signed)
Daily Session Note  Patient Details  Name: Antonio [name] MRN: [id number] Date of Birth: 03/19/1961 Referring Provider:   Flowsheet Row Cardiac Rehab from 09/16/2021 in [facility] Cardiac and Pulmonary Rehab  Referring Provider Skains       Encounter Date: 01/01/2022  Check In:  Session Check In - 01/01/22 1009       Check-In   Supervising physician immediately available to respond to emergencies See telemetry face sheet for immediately available ER MD    Location [facility]-Cardiac & Pulmonary Rehab    Staff Present Heath Lark, RN, BSN, CCRP;Melissa Offerman, RDN, LDN;Joseph Berry, [place]    Virtual Visit No    Medication changes reported     No    Fall or balance concerns reported    No    Warm-up and Cool-down Performed on first and last piece of equipment    Resistance Training Performed Yes    VAD Patient? No    PAD/SET Patient? No      Pain Assessment   Currently in Pain? No/denies                Social History   Tobacco Use  Smoking Status Former   Packs/day: 1.00   Years: 16.00   Total pack years: 16.00   Types: Cigarettes   Quit date: 05/13/1991   Years since quitting: 30.6  Smokeless Tobacco Never  Tobacco Comments   Quit 30 years ago    Goals Met:  Independence with exercise equipment Exercise tolerated well No report of concerns or symptoms today  Goals Unmet:  Not Applicable  Comments: Pt able to follow exercise prescription today without complaint.  Will continue to monitor for progression.    Dr. Emily Filbert is Medical Director for [facility].  Dr. Ottie Glazier is Medical Director for [facility] Pulmonary Rehabilitation.

## 2022-01-03 ENCOUNTER — Encounter: Payer: Self-pay | Admitting: *Deleted

## 2022-01-03 DIAGNOSIS — Z951 Presence of aortocoronary bypass graft: Secondary | ICD-10-CM

## 2022-01-03 DIAGNOSIS — I214 Non-ST elevation (NSTEMI) myocardial infarction: Secondary | ICD-10-CM

## 2022-01-03 NOTE — Progress Notes (Signed)
Daily Session Note  Patient Details  Name: Antonio [name] MRN: [id number] Date of Birth: [birth year] Referring Provider:   Flowsheet Row Cardiac Rehab from 09/16/2021 in [facility] Cardiac and Pulmonary Rehab  Referring Provider Skains       Encounter Date: 01/03/2022  Check In:  Session Check In - 01/03/22 0953       Check-In   Supervising physician immediately available to respond to emergencies See telemetry face sheet for immediately available ER MD    Location [facility]-Cardiac & Pulmonary Rehab    Staff Present Justin Mend, Lorre Nick, MA, RCEP, CCRP, CCET;Orlando Thalmann Sherryll Burger, RN BSN    Virtual Visit No    Medication changes reported     No    Fall or balance concerns reported    No    Warm-up and Cool-down Performed on first and last piece of equipment    Resistance Training Performed Yes    VAD Patient? No    PAD/SET Patient? No      Pain Assessment   Currently in Pain? No/denies                Social History   Tobacco Use  Smoking Status Former   Packs/day: 1.00   Years: 16.00   Total pack years: 16.00   Types: Cigarettes   Quit date: 05/13/1991   Years since quitting: 30.6  Smokeless Tobacco Never  Tobacco Comments   Quit 30 years ago    Goals Met:  Independence with exercise equipment Exercise tolerated well No report of concerns or symptoms today Strength training completed today  Goals Unmet:  Not Applicable  Comments:  Antonio [name] graduated today from  rehab with 35 sessions completed.  Details of the patient's exercise prescription and what He needs to do in order to continue the prescription and progress were discussed with patient.  Patient was given a copy of prescription and goals.  Patient verbalized understanding.  Antonio [name] plans to continue to exercise by walking .    Dr. Emily Filbert is Medical Director for [place].  Dr. Ottie Glazier is Medical Director for [facility] Pulmonary Rehabilitation.

## 2022-01-03 NOTE — Progress Notes (Signed)
Cardiac Individual Treatment Plan  Patient Details  Name: Antonio [name] MRN: [id number] Date of Birth: [birth year] Referring Provider:   Flowsheet Row Cardiac Rehab from 09/16/2021 in [facility] Cardiac and Pulmonary Rehab  Referring Provider Skains       Initial Encounter Date:  Flowsheet Row Cardiac Rehab from 09/16/2021 in [facility] Cardiac and Pulmonary Rehab  Date 09/16/21       Visit Diagnosis: NSTEMI (non-ST elevation myocardial infarction) ([employer])  S/P CABG x 4  Patient's Home Medications on Admission:  Current Outpatient Medications:    Ascorbic Acid (VITAMIN C) 1000 MG tablet, Take 1,000 mg by mouth daily., Disp: , Rfl:    aspirin EC 81 MG tablet, Take 81 mg by mouth daily. Swallow whole., Disp: , Rfl:    atorvastatin (LIPITOR) 80 MG tablet, Take 1 tablet (80 mg total) by mouth daily at 6 PM., Disp: 30 tablet, Rfl: 2   cholecalciferol (VITAMIN D3) 25 MCG (1000 UNIT) tablet, Take 1,000 Units by mouth daily., Disp: , Rfl:    clopidogrel (PLAVIX) 75 MG tablet, Take 1 tablet (75 mg total) by mouth daily., Disp: 30 tablet, Rfl: 11   metoprolol tartrate (LOPRESSOR) 25 MG tablet, Take 1 tablet (25 mg total) by mouth 2 (two) times daily., Disp: 60 tablet, Rfl: 2   Multiple Vitamins-Minerals (LIVER DETOX PO), Take 1 tablet by mouth daily., Disp: , Rfl:    SEMAGLUTIDE-WEIGHT MANAGEMENT [place], Inject 20 Units into the skin daily., Disp: , Rfl:    tadalafil (CIALIS) 5 MG tablet, Take 5 mg by mouth daily., Disp: , Rfl:    testosterone cypionate (DEPOTESTOSTERONE CYPIONATE) 200 MG/ML injection, , Disp: , Rfl:    vitamin B-12 (CYANOCOBALAMIN) 500 MCG tablet, Take 500 mcg by mouth daily., Disp: , Rfl:   Past Medical History: Past Medical History:  Diagnosis Date   Basal cell carcinoma 03/23/2018   right upper lip/inf nasolabial   Basal cell carcinoma 11/23/2019   Right Spinal Upper Back, EDC    Tobacco Use: Social History   Tobacco Use  Smoking Status Former   Packs/day: 1.00   Years: 16.00    Total pack years: 16.00   Types: Cigarettes   Quit date: 05/13/1991   Years since quitting: 30.6  Smokeless Tobacco Never  Tobacco Comments   Quit 30 years ago    Labs: Review Flowsheet  More data may exist      Latest Ref Rng & Units 04/21/2006 08/02/2021 08/05/2021 08/06/2021 10/09/2021  Labs for ITP Cardiac and Pulmonary Rehab  Cholestrol 100 - 199 mg/dL 204  192  - - 155   LDL (calc) 0 - 99 mg/dL - 130  - - 88   Direct LDL mg/dL 167.2  - - - -  HDL-C >39 mg/dL 39.5  42  - - 50   Trlycerides 0 - 149 mg/dL 74  102  - - 90   Hemoglobin A1c 4.8 - 5.6 % - 5.6  - - -  PH, Arterial 7.35 - 7.45 - - 7.46  7.347  7.314  7.268  7.235  7.327  7.370  7.374  7.329  7.320  -  PCO2 arterial 32 - 48 mmHg - - 34  42.2  38.8  41.6  53.8  46.2  41.6  42.9  50.1  49.7  -  Bicarbonate 20.0 - 28.0 mmol/L - - 24.2  23.0  19.7  19.2  23.0  24.2  24.0  25.1  24.4  26.4  25.6  -  TCO2 22 -  32 mmol/L - - - $R'24  21  20  25  26  25  25  26  26  26  28  25  27  27  'fJ$ -  Acid-base deficit 0.0 - 2.0 mmol/L - - - 2.0  6.0  8.0  5.0  2.0  1.0  2.0  1.0  -  O2 Saturation % - - 97.9  95  95  94  95  100  100  100  85  100  100  -     Exercise Target Goals: Exercise Program Goal: Individual exercise prescription set using results from initial 6 min walk test and THRR while considering  patient's activity barriers and safety.   Exercise Prescription Goal: Initial exercise prescription builds to 30-45 minutes a day of aerobic activity, 2-3 days per week.  Home exercise guidelines will be given to patient during program as part of exercise prescription that the participant will acknowledge.   Education: Aerobic Exercise: - Group verbal and visual presentation on the components of exercise prescription. Introduces F.I.T.T principle from ACSM for exercise prescriptions.  Reviews F.I.T.T. principles of aerobic exercise including progression. Written material given at graduation. Flowsheet Row Cardiac Rehab from 12/11/2021 in  [facility] Cardiac and Pulmonary Rehab  Education need identified 09/16/21       Education: Resistance Exercise: - Group verbal and visual presentation on the components of exercise prescription. Introduces F.I.T.T principle from ACSM for exercise prescriptions  Reviews F.I.T.T. principles of resistance exercise including progression. Written material given at graduation. Flowsheet Row Cardiac Rehab from 12/11/2021 in [facility] Cardiac and Pulmonary Rehab  Date 09/18/21  Educator [facility]  Instruction Review Code 1- Verbalizes Understanding        Education: Exercise & Equipment Safety: - Individual verbal instruction and demonstration of equipment use and safety with use of the equipment. Flowsheet Row Cardiac Rehab from 12/11/2021 in [facility] Cardiac and Pulmonary Rehab  Date 09/16/21  Educator [facility]  Instruction Review Code 1- Verbalizes Understanding       Education: Exercise Physiology & General Exercise Guidelines: - Group verbal and written instruction with models to review the exercise physiology of the cardiovascular system and associated critical values. Provides general exercise guidelines with specific guidelines to those with heart or lung disease.  Flowsheet Row Cardiac Rehab from 12/11/2021 in [facility] Cardiac and Pulmonary Rehab  Date 11/06/21  Educator NT  Instruction Review Code 1- [employer] Understanding       Education: Flexibility, Balance, Mind/Body Relaxation: - Group verbal and visual presentation with interactive activity on the components of exercise prescription. Introduces F.I.T.T principle from ACSM for exercise prescriptions. Reviews F.I.T.T. principles of flexibility and balance exercise training including progression. Also discusses the mind body connection.  Reviews various relaxation techniques to help reduce and manage stress (i.e. Deep breathing, progressive muscle relaxation, and visualization). Balance handout provided to take home. Written material given at  graduation. Flowsheet Row Cardiac Rehab from 12/11/2021 in [facility] Cardiac and Pulmonary Rehab  Date 09/25/21  Educator [facility]  Instruction Review Code 1- Verbalizes Understanding       Activity Barriers & Risk Stratification:  Activity Barriers & Cardiac Risk Stratification - 09/11/21 1019       Activity Barriers & Cardiac Risk Stratification   Activity Barriers None    Cardiac Risk Stratification High             6 Minute Walk:  6 Minute Walk     Row Name 09/16/21 1714 12/23/21 1038  6 Minute Walk   Phase Initial Discharge    Distance 1760 feet 2055 feet    Distance % Change -- 16.7 %    Distance Feet Change -- 295 ft    Walk Time 6 minutes 6 minutes    # of Rest Breaks 0 0    MPH 3.33 3.89    METS 4.2 4.77    RPE 12 13    Perceived Dyspnea  0 0    VO2 Peak 14.69 16.71    Symptoms No No    Resting HR 71 bpm 80 bpm    Resting BP 104/70 118/76    Resting Oxygen Saturation  96 % 97 %    Exercise Oxygen Saturation  during 6 min walk 96 % 96 %    Max Ex. HR 82 bpm 119 bpm    Max Ex. BP 134/70 138/62    2 Minute Post BP 124/72 120/60             Oxygen Initial Assessment:   Oxygen Re-Evaluation:   Oxygen Discharge (Final Oxygen Re-Evaluation):   Initial Exercise Prescription:  Initial Exercise Prescription - 09/17/21 0700       Date of Initial Exercise RX and Referring Provider   Date 09/16/21    Referring Provider Skains      Oxygen   Maintain Oxygen Saturation 88% or higher      Treadmill   MPH 3.5    Grade 1    Minutes 15    METs 4.16      Recumbant Elliptical   Level 2    RPM 50    Minutes 15    METs 4.2      Elliptical   Level 1    Speed 3    Minutes 15    METs 4.2      REL-XR   Level 3    Speed 50    Minutes 15    METs 4.2      Prescription Details   Frequency (times per week) 3      Intensity   THRR 40-80% of Max Heartrate 105-140    Ratings of Perceived Exertion 11-13    Perceived Dyspnea 0-4      Progression    Progression Continue to progress workloads to maintain intensity without signs/symptoms of physical distress.      Resistance Training   Training Prescription Yes    Weight 5    Reps 10-15             Perform Capillary Blood Glucose checks as needed.  Exercise Prescription Changes:   Exercise Prescription Changes     Row Name 09/16/21 1700 09/23/21 1600 09/27/21 1000 10/09/21 1500 10/22/21 0800     Response to Exercise   Blood Pressure (Admit) 104/70 126/80 -- 126/70 126/64   Blood Pressure (Exercise) 134/70 124/62 -- 148/70 180/80   Blood Pressure (Exit) 124/72 98/56 -- 100/60 110/60   Heart Rate (Admit) 71 bpm 83 bpm -- 85 bpm 75 bpm   Heart Rate (Exercise) 118 bpm 128 bpm -- 147 bpm 140 bpm   Heart Rate (Exit) 82 bpm 95 bpm -- 100 bpm 106 bpm   Oxygen Saturation (Admit) 96 % -- -- -- --   Oxygen Saturation (Exercise) 96 % -- -- -- --   Oxygen Saturation (Exit) 96 % -- -- -- --   Rating of Perceived Exertion (Exercise) 12 13 -- 15 13   Perceived Dyspnea (Exercise) 0 -- -- -- --  Symptoms none none -- none none   Comments 6 MWT results third full day of exercise -- -- --   Duration -- Continue with 30 min of aerobic exercise without signs/symptoms of physical distress. -- Continue with 30 min of aerobic exercise without signs/symptoms of physical distress. Continue with 30 min of aerobic exercise without signs/symptoms of physical distress.   Intensity -- THRR unchanged -- THRR unchanged THRR unchanged     Progression   Progression -- Continue to progress workloads to maintain intensity without signs/symptoms of physical distress. -- Continue to progress workloads to maintain intensity without signs/symptoms of physical distress. Continue to progress workloads to maintain intensity without signs/symptoms of physical distress.   Average METs -- 4.41 -- 5.4 5.84     Resistance Training   Training Prescription -- Yes -- Yes Yes   Weight -- 5 lb -- 5 lb 5 lb   Reps --  10-15 -- 10-15 10-15     Interval Training   Interval Training -- No -- No No     Treadmill   MPH -- 3.5 -- 3.9 4   Grade -- 2 -- 2.5 3   Minutes -- 15 -- 15 15   METs -- 4.65 -- 5.33 5.72     Recumbant Elliptical   Level -- 4 -- -- --   Minutes -- 15 -- -- --   METs -- 5.5 -- -- --     Elliptical   Level -- -- -- 7 8   Speed -- -- -- -- 3   Minutes -- -- -- 15 15   METs -- -- -- 6.6 6.5     REL-XR   Level -- 8 -- 8 8   Minutes -- 15 -- 15 15   METs -- 3.1 -- 5.3 5.3     Home Exercise Plan   Plans to continue exercise at -- -- Home (comment)  walking, home gym Home (comment)  walking, home gym Home (comment)  walking, home gym   Frequency -- -- Add 2 additional days to program exercise sessions. Add 2 additional days to program exercise sessions. Add 2 additional days to program exercise sessions.   Initial Home Exercises Provided -- -- 09/27/21 09/27/21 09/27/21     Oxygen   Maintain Oxygen Saturation -- 88% or higher -- 88% or higher 88% or higher    Row Name 11/04/21 1000 11/18/21 1400 12/03/21 0900 12/16/21 1100 12/30/21 1100     Response to Exercise   Blood Pressure (Admit) 130/76 124/62 126/70 124/82 122/70   Blood Pressure (Exit) 124/78 94/58 98/62  112/74 126/64   Heart Rate (Admit) 78 bpm 84 bpm 75 bpm 74 bpm 74 bpm   Heart Rate (Exercise) 149 bpm 137 bpm 142 bpm 137 bpm 128 bpm   Heart Rate (Exit) 89 bpm 116 bpm 110 bpm 80 bpm 99 bpm   Oxygen Saturation (Admit) -- 98 % 97 % 97 % 96 %   Oxygen Saturation (Exercise) -- 96 % 94 % 95 % 94 %   Oxygen Saturation (Exit) -- 96 % 97 % 97 % 97 %   Rating of Perceived Exertion (Exercise) 15 14 15 15 13    Symptoms none none none none none   Duration Continue with 30 min of aerobic exercise without signs/symptoms of physical distress. Continue with 30 min of aerobic exercise without signs/symptoms of physical distress. Continue with 30 min of aerobic exercise without signs/symptoms of physical distress. Continue with 30  min of aerobic  exercise without signs/symptoms of physical distress. Continue with 30 min of aerobic exercise without signs/symptoms of physical distress.   Intensity THRR unchanged THRR unchanged THRR unchanged THRR unchanged THRR unchanged     Progression   Progression Continue to progress workloads to maintain intensity without signs/symptoms of physical distress. Continue to progress workloads to maintain intensity without signs/symptoms of physical distress. Continue to progress workloads to maintain intensity without signs/symptoms of physical distress. Continue to progress workloads to maintain intensity without signs/symptoms of physical distress. Continue to progress workloads to maintain intensity without signs/symptoms of physical distress.   Average METs 5.17 8.35 10.62 7.05 6.43     Resistance Training   Training Prescription Yes Yes Yes Yes Yes   Weight 5 lb 5 lb 5 lb 5 lb 5 lb   Reps 10-15 10-15 10-15 10-15 10-15     Interval Training   Interval Training No No Yes No No   Equipment -- -- Treadmill -- --   Comments -- -- walking/running- 37mph-8 mph -- --     Treadmill   MPH $Re'4 4 8  'RBU$ intervals 5.5 4.2   Grade 3 9 2.$Rem'5 2 4   'xuQk$ Minutes $Re'15 15 15 15 15   'sIf$ METs 5.72 9.1  interval running 16.2  average with intervals (26mph-8mph) 6.73 6.53     NuStep   Level -- -- -- -- 7   Minutes -- -- -- -- 15   METs -- -- -- -- 5.2     Elliptical   Level $Remo'9 10 8 11 'sECfJ$ --   Speed 3 3.$RemoveB'9 7 4 'tspBwNFe$ --   Minutes $Remove'15 15 15 15 'EDGZoih$ --   METs -- 7.6 -- -- --     REL-XR   Level 8 -- -- 11 11   Minutes 15 -- -- 15 15   METs 5.2 -- -- 9.5 9.2     Home Exercise Plan   Plans to continue exercise at Home (comment)  walking, home gym Home (comment)  walking, home gym -- Home (comment)  walking, home gym Home (comment)  walking, home gym   Frequency Add 2 additional days to program exercise sessions. Add 2 additional days to program exercise sessions. -- Add 2 additional days to program exercise sessions. Add 2  additional days to program exercise sessions.   Initial Home Exercises Provided 09/27/21 09/27/21 -- 09/27/21 09/27/21     Oxygen   Maintain Oxygen Saturation 88% or higher 88% or higher 88% or higher 88% or higher 88% or higher            Exercise Comments:   Exercise Comments     Row Name 09/18/21 1002           Exercise Comments First full day of exercise!  Patient was oriented to gym and equipment including functions, settings, policies, and procedures.  Patient's individual exercise prescription and treatment plan were reviewed.  All starting workloads were established based on the results of the 6 minute walk test done at initial orientation visit.  The plan for exercise progression was also introduced and progression will be customized based on patient's performance and goals.                Exercise Goals and Review:   Exercise Goals     Row Name 09/16/21 1720             Exercise Goals   Increase Physical Activity Yes       Intervention Provide advice, education, support  and counseling about physical activity/exercise needs.;Develop an individualized exercise prescription for aerobic and resistive training based on initial evaluation findings, risk stratification, comorbidities and participant's personal goals.       Expected Outcomes Short Term: Attend rehab on a regular basis to increase amount of physical activity.;Long Term: Add in home exercise to make exercise part of routine and to increase amount of physical activity.;Long Term: Exercising regularly at least 3-5 days a week.       Increase Strength and Stamina Yes       Intervention Provide advice, education, support and counseling about physical activity/exercise needs.;Develop an individualized exercise prescription for aerobic and resistive training based on initial evaluation findings, risk stratification, comorbidities and participant's personal goals.       Expected Outcomes Short Term: Increase  workloads from initial exercise prescription for resistance, speed, and METs.;Short Term: Perform resistance training exercises routinely during rehab and add in resistance training at home;Long Term: Improve cardiorespiratory fitness, muscular endurance and strength as measured by increased METs and functional capacity (6MWT)       Able to understand and use rate of perceived exertion (RPE) scale Yes       Intervention Provide education and explanation on how to use RPE scale       Expected Outcomes Short Term: Able to use RPE daily in rehab to express subjective intensity level;Long Term:  Able to use RPE to guide intensity level when exercising independently       Able to understand and use Dyspnea scale Yes       Intervention Provide education and explanation on how to use Dyspnea scale       Expected Outcomes Short Term: Able to use Dyspnea scale daily in rehab to express subjective sense of shortness of breath during exertion;Long Term: Able to use Dyspnea scale to guide intensity level when exercising independently       Knowledge and understanding of Target Heart Rate Range (THRR) Yes       Intervention Provide education and explanation of THRR including how the numbers were predicted and where they are located for reference       Expected Outcomes Short Term: Able to state/look up THRR;Long Term: Able to use THRR to govern intensity when exercising independently;Short Term: Able to use daily as guideline for intensity in rehab       Able to check pulse independently Yes       Intervention Provide education and demonstration on how to check pulse in carotid and radial arteries.;Review the importance of being able to check your own pulse for safety during independent exercise       Expected Outcomes Short Term: Able to explain why pulse checking is important during independent exercise;Long Term: Able to check pulse independently and accurately       Understanding of Exercise Prescription Yes        Intervention Provide education, explanation, and written materials on patient's individual exercise prescription       Expected Outcomes Short Term: Able to explain program exercise prescription;Long Term: Able to explain home exercise prescription to exercise independently                Exercise Goals Re-Evaluation :  Exercise Goals Re-Evaluation     Row Name 09/18/21 1002 09/23/21 1614 09/27/21 1016 10/10/21 0705 10/22/21 0837     Exercise Goal Re-Evaluation   Exercise Goals Review Increase Physical Activity;Able to understand and use rate of perceived exertion (RPE) scale;Knowledge and understanding of  Target Heart Rate Range (THRR);Understanding of Exercise Prescription;Able to understand and use Dyspnea scale;Able to check pulse independently;Increase Strength and Stamina Increase Physical Activity;Increase Strength and Stamina;Understanding of Exercise Prescription Increase Physical Activity;Increase Strength and Stamina;Understanding of Exercise Prescription;Able to understand and use rate of perceived exertion (RPE) scale;Able to understand and use Dyspnea scale;Knowledge and understanding of Target Heart Rate Range (THRR);Able to check pulse independently Increase Physical Activity;Increase Strength and Stamina;Understanding of Exercise Prescription Increase Physical Activity;Increase Strength and Stamina;Understanding of Exercise Prescription   Comments Reviewed RPE and dyspnea scales, THR and program prescription with pt today.  Pt voiced understanding and was given a copy of goals to take home. Lakendrick is off to a good start in rehab.  He has completed his first three full days in rehab.  He is already up to level 8 on the XR.  We will continue to monitor his progress. Reviewed home exercise with pt today.  Pt plans to walk his land at home for exercise.   He also has weights at home and has a friend that has a home gym he is able to use as well. Reviewed THR, pulse, RPE, sign and  symptoms, pulse oximetery and when to call 911 or MD.  Also discussed weather considerations and indoor options.  Pt voiced understanding. Patterson has been doing well in rehab. He increased his average MET level to 5.40 METs. He also increased his speed on the treadmill to 3.9 mph and his grade to 2.5%. He has also consistently hit his THR zone during exercise. He tolerated using 5 lb weights for resistance training. We will continue to monitor Chuckie's progress in the program. Crockett continues to do well in rehab.  He has missed a couple of visits with vacation and work.  He tried level 8 on the ellipitcal but has to reduce back to level 6.  We will continue to monitor his progress.   Expected Outcomes Short: Use RPE daily to regulate intensity. Long: Follow program prescription in THR. Short: Continue to attend rehab regularly Long: Continue to follow program prescription Short: Start to add in more exercise at home Long: Continue to exercise independently Short: Increase weight for resistance training. Long: Continue to improve strength and stamina. Short: Return to regular attendance in rehab. Long: Continue to improve stamina    Row Name 11/04/21 1004 11/04/21 1010 11/18/21 1455 12/03/21 0948 12/11/21 1017     Exercise Goal Re-Evaluation   Exercise Goals Review Increase Physical Activity;Increase Strength and Stamina;Understanding of Exercise Prescription Increase Physical Activity;Increase Strength and Stamina;Understanding of Exercise Prescription Increase Physical Activity;Increase Strength and Stamina;Understanding of Exercise Prescription Increase Physical Activity;Increase Strength and Stamina;Understanding of Exercise Prescription Increase Physical Activity;Increase Strength and Stamina;Understanding of Exercise Prescription   Comments Aarav is doing well in rehab. He recently improved to level 9 on the elliptical. He is back up to 4 mph on the treadmill as well. He has also tolerated 5 lb hand weights  for resistance training. We will continue to monitor Burlie's progress in the program. Aleksandar reports being physically active, but does not currently do any structured exercise outside of rehab. Reviewed the importance of structured exercise and encouraged him to add at least 2 days of this when not at rehab. He reports there is a senior center near his house that he might be able to use for free. Jay is doing well in rehab.  He is p to level 10 on the elliptical with intervals.  He is also starting to do  some running intervals on the treadmill as well.  We will continue to montior his progress. Starlin continues to do well. HE is now up to 69mph as his max workload on the treadmill. He is doing intervals between 2mph-8mph with a 2.5% incline and tolerates it well. He continues to work on the elliptical and has increased his speed to 7.0. He is very well reaching his THR. Will continue to monitor. Jadon reports being physically active, but does not currently do much structured exercise outside of rehab. He reports that sometimes he will walk or run outside - reminded him of safety exercising outdoors. Reviewed the importance of structured exercise and encouraged him to add at least 2 days of this when not at rehab. He reports there is a senior center near his house; he is going to look into that for exercise outside of rehab. He has a pulse oximeter at home to track his HR; reviewed THR 105-140.   Expected Outcomes Short: continue to attend rehab regularly. Long: Continue to improve strength and stamina. Short: begin structured exercise outside of rehab at least 2x/week Long: Continue to improve strength and stamina. Short: Try 6 lb weights and continue to use intervals Long: Continue to improve stamina Short: Continue with intervals on treadmill Long: Continue to increase overall MET level Short: begin structured exercise outside of rehab at least 2x/week Long: Continue to improve strength and stamina.    Bearden Name  12/16/21 1159 12/25/21 1016 12/30/21 1157         Exercise Goal Re-Evaluation   Exercise Goals Review Increase Physical Activity;Increase Strength and Stamina;Understanding of Exercise Prescription Increase Physical Activity;Increase Strength and Stamina;Understanding of Exercise Prescription Increase Physical Activity;Increase Strength and Stamina;Understanding of Exercise Prescription     Comments Daesean continues to do well in rehab. He recently improved to level 11 on both the elliptical and the XR. He has also been consistently working at 7 METs or higher. He has tolerated 5 lb hand weights for resistance training and may benefit from trying 6 lb. We will continue to monitor his progress in the program. Reade will [facility] to the senior center for [facility] after graduating rehab - he has visited it and filled out the paperwork needed. He hurt his knee and is trying to focus on seated equipment for now. Patton is doing well in rehab and is close to graduating. He recently improved on his post 6MWT by 16.7%! He had an overall average MET level of 6.43 METs since the last evaluation. He also did well with the treadmill at a speed of 4.2 mph and an incline of 4%. We will continue to monitor his progress in the program.     Expected Outcomes Short: Try 6 lb for resistance training. Long: Continue to improve strength and stamina. ST: graduate rehab  LT: contnue to exercise after graduating: 150 minutes of moderate activity per week. Short: Graduate. Long: Continue to exercise independently.              Discharge Exercise Prescription (Final Exercise Prescription Changes):  Exercise Prescription Changes - 12/30/21 1100       Response to Exercise   Blood Pressure (Admit) 122/70    Blood Pressure (Exit) 126/64    Heart Rate (Admit) 74 bpm    Heart Rate (Exercise) 128 bpm    Heart Rate (Exit) 99 bpm    Oxygen Saturation (Admit) 96 %    Oxygen Saturation (Exercise) 94 %    Oxygen Saturation (Exit) 97 %  Rating of Perceived Exertion (Exercise) 13    Symptoms none    Duration Continue with 30 min of aerobic exercise without signs/symptoms of physical distress.    Intensity THRR unchanged      Progression   Progression Continue to progress workloads to maintain intensity without signs/symptoms of physical distress.    Average METs 6.43      Resistance Training   Training Prescription Yes    Weight 5 lb    Reps 10-15      Interval Training   Interval Training No      Treadmill   MPH 4.2    Grade 4    Minutes 15    METs 6.53      NuStep   Level 7    Minutes 15    METs 5.2      REL-XR   Level 11    Minutes 15    METs 9.2      Home Exercise Plan   Plans to continue exercise at Home (comment)   walking, home gym   Frequency Add 2 additional days to program exercise sessions.    Initial Home Exercises Provided 09/27/21      Oxygen   Maintain Oxygen Saturation 88% or higher             Nutrition:  Target Goals: Understanding of nutrition guidelines, daily intake of sodium '1500mg'$ , cholesterol '200mg'$ , calories 30% from fat and 7% or less from saturated fats, daily to have 5 or more servings of fruits and vegetables.  Education: All About Nutrition: -Group instruction provided by verbal, written material, interactive activities, discussions, models, and posters to present general guidelines for heart healthy nutrition including fat, fiber, MyPlate, the role of sodium in heart healthy nutrition, utilization of the nutrition label, and utilization of this knowledge for meal planning. Follow up email sent as well. Written material given at graduation. Flowsheet Row Cardiac Rehab from 12/11/2021 in [facility] Cardiac and Pulmonary Rehab  Education need identified 09/16/21  Date 10/02/21  Educator [place]  Instruction Review Code 1- Verbalizes Understanding       Biometrics:  Pre Biometrics - 09/16/21 1721       Pre Biometrics   Height $Remov'5\' 9"'LrpwdM$  (1.753 m)    Weight 205 lb 4.8 oz  (93.1 kg)    BMI (Calculated) 30.3    Single Leg Stand 30 seconds             Post Biometrics - 12/23/21 1040        Post  Biometrics   Height $Remov'5\' 9"'yaPLfe$  (1.753 m)    Weight 203 lb 3.2 oz (92.2 kg)    BMI (Calculated) 29.99    Single Leg Stand 30 seconds             Nutrition Therapy Plan and Nutrition Goals:  Nutrition Therapy & Goals - 09/30/21 1114       Nutrition Therapy   Diet Heart healthy, low Na    Protein (specify units) 75g    Fiber 30 grams    Whole Grain Foods 3 servings    Saturated Fats 16 max. grams    Fruits and Vegetables 8 servings/day    Sodium 2 grams      Personal Nutrition Goals   Nutrition Goal ST: review handouts, brainstorm more last minute meals, take note of the amount of salt eaten during the day LT: limit Na <2g/day, become prepared for last minute meals    Comments 61 y.o. M admitted  to rehab s/p NSTEMI. PMHx includes past tobacco use, HLD, right lower lobe nodule, basal cell cancer. PSHx CABGx4 (2023). Relevant medications includes vit C, lipitor, vit D3, MVI (liver detox PO- unspecified name), testosterone cypionate. PYP Score: 66. Vegetables & Fruits 7/12. Breads, Grains & Cereals 7/12. Red & Processed Meat 10/12. Poultry 2/2. Fish & Shellfish 1/4. Beans, Nuts & Seeds 4/4. Milk & Dairy Foods 4/6. Toppings, Oils, Seasonings & Salt 13/20. Sweets, Snacks & Restaurant Food 8/14. Beverages 10/10. B: coffee black (sometimes with half and half), eggs with olive oil L: wife will make him something D: wife cooks - protein, starch, vegetable - usually chicken. Salads. When his wife cooks, he reports eating well and with a good variety; when she doesn't he will go out to eat and have less health promoting options. Discussed last minute meals and versitile ingredients. Annette reports using a lot of salt right now - encouraged to reduce slowly to make changes more sustainable. Discussed heart healthy eating.      Intervention Plan   Intervention Prescribe,  educate and counsel regarding individualized specific dietary modifications aiming towards targeted core components such as weight, hypertension, lipid management, diabetes, heart failure and other comorbidities.    Expected Outcomes Short Term Goal: Understand basic principles of dietary content, such as calories, fat, sodium, cholesterol and nutrients.;Short Term Goal: A plan has been developed with personal nutrition goals set during dietitian appointment.;Long Term Goal: Adherence to prescribed nutrition plan.             Nutrition Assessments:  MEDIFICTS Score Key: ?70 Need to make dietary changes  40-70 Heart Healthy Diet ? 40 Therapeutic Level Cholesterol Diet  Flowsheet Row Cardiac Rehab from 09/16/2021 in [facility] Cardiac and Pulmonary Rehab  Picture Your Plate Total Score on Admission 66      Picture Your Plate Scores: <11 Unhealthy dietary pattern with much room for improvement. 41-50 Dietary pattern unlikely to meet recommendations for good health and room for improvement. 51-60 More healthful dietary pattern, with some room for improvement.  >60 Healthy dietary pattern, although there may be some specific behaviors that could be improved.    Nutrition Goals Re-Evaluation:  Nutrition Goals Re-Evaluation     [place] Name 10/16/21 1014 11/04/21 1015 12/11/21 1023 12/25/21 1004       Goals   Current Weight 205 lb (93 kg) -- -- --    Nutrition Goal Cut back on snacking ST: try out recipes, take note of the amount of salt eaten during the day, pair peanuts with fruit LT: limit Na <2g/day, become prepared for last minute meals ST: continue with heart healthy changes LT: limit Na <2g/day, become prepared for last minute meals ST: continue with heart healthy changes after graduating LT: limit Na <2g/day, become prepared for last minute meals    Comment Dezmen would like to cut back on his snacking. He likes to eat peanut type snacks and other nuts. He states that peanuts are his one  snack that he eats to much of. Informed him that peanuts and other nut snacks have high calories. He has tried out smoothies with basil, spinach, watermelon, chia seeds, beets, frozen banana. He felt great after having the smoothie and felt it better fueled his exercise. He is snacking on peanuts, but feels tha the may eat too many in one sitting. Advised pairing fruit with peanuts for a more balanced snack, encouraged to try cutting back on sodium as previously discussed. He has not tried out the recipes  given by this RD, but plans to. Mearl reports doing well with his nutrition, he is including lots of variety, lean proteins, healthy fat, and fruits/vegetables. Most mornings he has been having oatmeal with chia seeds, frozen bananas, and sometimes eggs. He has been having shakes with different herbs, vegetables, and fruits. His wife normally cooks him dinner and has been making heart healthy meals. he has no questions at this time and reports no barriers to continuing heart healthy eating. Kiel continues to do well with the heart healthy changes he has made while in rehab. He reports no barriers to continuing with these chanes after graduation.    Expected Outcome Short: cut back on portions for snacking. Long: maintain a diet that adheres to him. ST: try out recipes, take note of the amount of salt eaten during the day, pair peanuts with fruit LT: limit Na <2g/day, become prepared for last minute meals ST: continue with heart healthy changes LT: limit Na <2g/day, become prepared for last minute meals ST: continue with heart healthy changes after graduating LT: limit Na <2g/day, become prepared for last minute meals             Nutrition Goals Discharge (Final Nutrition Goals Re-Evaluation):  Nutrition Goals Re-Evaluation - 12/25/21 1004       Goals   Nutrition Goal ST: continue with heart healthy changes after graduating LT: limit Na <2g/day, become prepared for last minute meals    Comment Nagi  continues to do well with the heart healthy changes he has made while in rehab. He reports no barriers to continuing with these chanes after graduation.    Expected Outcome ST: continue with heart healthy changes after graduating LT: limit Na <2g/day, become prepared for last minute meals             Psychosocial: Target Goals: Acknowledge presence or absence of significant depression and/or stress, maximize coping skills, provide positive support system. Participant is able to verbalize types and ability to use techniques and skills needed for reducing stress and depression.   Education: Stress, Anxiety, and Depression - Group verbal and visual presentation to define topics covered.  Reviews how body is impacted by stress, anxiety, and depression.  Also discusses healthy ways to reduce stress and to treat/manage anxiety and depression.  Written material given at graduation. Flowsheet Row Cardiac Rehab from 12/11/2021 in [facility] Cardiac and Pulmonary Rehab  Date 10/30/21  Educator [facility]  Instruction Review Code 1- [employer] Understanding       Education: Sleep Hygiene -Provides group verbal and written instruction about how sleep can affect your health.  Define sleep hygiene, discuss sleep cycles and impact of sleep habits. Review good sleep hygiene tips.    Initial Review & Psychosocial Screening:  Initial Psych Review & Screening - 09/11/21 1021       Initial Review   Current issues with None Identified      Family Dynamics   Good Support System? Yes   wife,children. church family     Barriers   Psychosocial barriers to participate in program There are no identifiable barriers or psychosocial needs.      Screening Interventions   Interventions Encouraged to exercise    Expected Outcomes Short Term goal: Utilizing psychosocial counselor, staff and physician to assist with identification of specific Stressors or current issues interfering with healing process. Setting desired goal  for each stressor or current issue identified.;Long Term Goal: Stressors or current issues are controlled or eliminated.;Short Term goal: Identification and  review with participant of any Quality of Life or Depression concerns found by scoring the questionnaire.;Long Term goal: The participant improves quality of Life and PHQ9 Scores as seen by post scores and/or verbalization of changes             Quality of Life Scores:   Quality of Life - 01/03/22 1101       Quality of Life   Select Quality of Life      Quality of Life Scores   Health/Function Pre 23.9 %    Health/Function Post 26.97 %    Health/Function % Change 12.85 %    Socioeconomic Pre 27.36 %    Socioeconomic Post 27.36 %    Socioeconomic % Change  0 %    Psych/Spiritual Pre 24.21 %    Psych/Spiritual Post 26.29 %    Psych/Spiritual % Change 8.59 %    Family Pre 25.2 %    Family Post 27.1 %    Family % Change 7.54 %    GLOBAL Pre 24.87 %    GLOBAL Post 26.93 %    GLOBAL % Change 8.28 %            Scores of 19 and below usually indicate a poorer quality of life in these areas.  A difference of  2-3 points is a clinically meaningful difference.  A difference of 2-3 points in the total score of the Quality of Life Index has been associated with significant improvement in overall quality of life, self-image, physical symptoms, and general health in studies assessing change in quality of life.  PHQ-9: Review Flowsheet       01/03/2022 09/16/2021  Depression screen PHQ 2/9  Decreased Interest 1 0  Down, Depressed, Hopeless 0 0  PHQ - 2 Score 1 0  Altered sleeping 1 0  Tired, decreased energy 1 1  Change in appetite 1 1  Feeling bad or failure about yourself  0 0  Trouble concentrating 0 0  Moving slowly or fidgety/restless 0 0  Suicidal thoughts 0 0  PHQ-9 Score 4 2  Difficult doing work/chores Not difficult at all -   Interpretation of Total Score  Total Score Depression Severity:  1-4 = Minimal  depression, 5-9 = Mild depression, 10-14 = Moderate depression, 15-19 = Moderately severe depression, 20-27 = Severe depression   Psychosocial Evaluation and Intervention:  Psychosocial Evaluation - 09/11/21 1045       Psychosocial Evaluation & Interventions   Interventions Encouraged to exercise with the program and follow exercise prescription    Comments Donnivan has no barriers to attending the program. He lives with his wife. HIs wife,  his children,granchildren and his church family are all there for support. His goal is to continue to heal and learn all he can to maintain a healthy lifestyle.  He is ready to get started.    Expected Outcomes STG [place] attends all scheduled exercise and education sessions,LTG Ulyses ontinues to progress his exercise regimen and utilizes what he learned in the program to help him maintain a healty lifestyle    Continue Psychosocial Services  Follow up required by staff             Psychosocial Re-Evaluation:  Psychosocial Re-Evaluation     [place] Name 10/16/21 1012 11/04/21 1018 12/11/21 1047 12/25/21 1005       Psychosocial Re-Evaluation   Current issues with None Identified None Identified None Identified None Identified    Comments Patient reports no issues with their  current mental states, sleep, stress, depression or anxiety. Will follow up with patient in a few weeks for any changes. He is a man of faith and goes to church regularly. Patient reports no issues with their current mental states, sleep, stress, depression or anxiety. Will follow up with patient in a few weeks for any changes. He is a man of faith and goes to church regularly. He feels like he can rely on the church and his wife for support. He reports that when stress arises he practices deep breathing to combat it. Osie reports that he is doing well mentally and has no major stressors at this time. Will focontinue to follow up with patient for changes. He reports that he is a man of faith and  goes to church regularly. He feels like he can rely on the church and his wife for support. He reports that when stress arises he practices deep breathing to combat it. The only stress he has is occassionally from work. He reports sleeping well. Bhargav reports that he continues to do well mentally and has no major stressors at this time and no changes to his support system of how he copes with stress. This week he is frustrated with the reflux he has been having due to medication, but is doing well overall. He reports that he is a man of faith and goes to church regularly. He feels like he can rely on the church and his wife for support. He reports that when stress arises he practices deep breathing to combat it. The only stress he has is occassionally from work. He reports sleeping well.    Expected Outcomes Short: Continue to exercise regularly to support mental health and notify staff of any changes. Long: maintain mental health and well being through teaching of rehab or prescribed medications independently. Short: Continue to exercise regularly to support mental health and notify staff of any changes. Long: maintain mental health and well being through teaching of rehab or prescribed medications independently. Short: Continue to exercise regularly to support mental health and notify staff of any changes. Long: maintain mental health and well being through teaching of rehab or prescribed medications independently. Short: Continue to exercise regularly after graduating to support mental health. Long: maintain mental health and well being through teaching of rehab or prescribed medications independently.    Interventions Encouraged to attend Cardiac Rehabilitation for the exercise Encouraged to attend Cardiac Rehabilitation for the exercise Encouraged to attend Cardiac Rehabilitation for the exercise Encouraged to attend Cardiac Rehabilitation for the exercise    Continue Psychosocial Services  Follow up required  by staff Follow up required by staff Follow up required by staff Follow up required by staff             Psychosocial Discharge (Final Psychosocial Re-Evaluation):  Psychosocial Re-Evaluation - 12/25/21 1005       Psychosocial Re-Evaluation   Current issues with None Identified    Comments Cashius reports that he continues to do well mentally and has no major stressors at this time and no changes to his support system of how he copes with stress. This week he is frustrated with the reflux he has been having due to medication, but is doing well overall. He reports that he is a man of faith and goes to church regularly. He feels like he can rely on the church and his wife for support. He reports that when stress arises he practices deep breathing to combat it. The only stress  he has is occassionally from work. He reports sleeping well.    Expected Outcomes Short: Continue to exercise regularly after graduating to support mental health. Long: maintain mental health and well being through teaching of rehab or prescribed medications independently.    Interventions Encouraged to attend Cardiac Rehabilitation for the exercise    Continue Psychosocial Services  Follow up required by staff             Vocational Rehabilitation: Provide vocational rehab assistance to qualifying candidates.   Vocational Rehab Evaluation & Intervention:   Education: Education Goals: Education classes will be provided on a variety of topics geared toward better understanding of heart health and risk factor modification. Participant will state understanding/return demonstration of topics presented as noted by education test scores.  Learning Barriers/Preferences:   General Cardiac Education Topics:  AED/CPR: - Group verbal and written instruction with the use of models to demonstrate the basic use of the AED with the basic ABC's of resuscitation.   Anatomy and Cardiac Procedures: - Group verbal and visual  presentation and models provide information about basic cardiac anatomy and function. Reviews the testing methods done to diagnose heart disease and the outcomes of the test results. Describes the treatment choices: Medical Management, Angioplasty, or Coronary Bypass Surgery for treating various heart conditions including Myocardial Infarction, Angina, Valve Disease, and Cardiac Arrhythmias.  Written material given at graduation. Flowsheet Row Cardiac Rehab from 12/11/2021 in [facility] Cardiac and Pulmonary Rehab  Education need identified 09/16/21  Date 09/18/21  Educator SB  Instruction Review Code 1- Verbalizes Understanding       Medication Safety: - Group verbal and visual instruction to review commonly prescribed medications for heart and lung disease. Reviews the medication, class of the drug, and side effects. Includes the steps to properly store meds and maintain the prescription regimen.  Written material given at graduation. Flowsheet Row Cardiac Rehab from 12/11/2021 in [facility] Cardiac and Pulmonary Rehab  Date 12/11/21  Educator SB  Instruction Review Code 1- Verbalizes Understanding       Intimacy: - Group verbal instruction through game format to discuss how heart and lung disease can affect sexual intimacy. Written material given at graduation..   Know Your Numbers and Heart Failure: - Group verbal and visual instruction to discuss disease risk factors for cardiac and pulmonary disease and treatment options.  Reviews associated critical values for Overweight/Obesity, Hypertension, Cholesterol, and Diabetes.  Discusses basics of heart failure: signs/symptoms and treatments.  Introduces Heart Failure Zone chart for action plan for heart failure.  Written material given at graduation.   Infection Prevention: - Provides verbal and written material to individual with discussion of infection control including proper hand washing and proper equipment cleaning during exercise  session. Flowsheet Row Cardiac Rehab from 12/11/2021 in [facility] Cardiac and Pulmonary Rehab  Date 09/16/21  Educator [facility]  Instruction Review Code 1- Verbalizes Understanding       Falls Prevention: - Provides verbal and written material to individual with discussion of falls prevention and safety. Flowsheet Row Cardiac Rehab from 12/11/2021 in [facility] Cardiac and Pulmonary Rehab  Date 09/16/21  Educator [facility]  Instruction Review Code 1- Verbalizes Understanding       Other: -Provides group and verbal instruction on various topics (see comments)   Knowledge Questionnaire Score:  Knowledge Questionnaire Score - 01/03/22 1056       Knowledge Questionnaire Score   Post Score 26/26             Core Components/Risk  Factors/Patient Goals at Admission:  Personal Goals and Risk Factors at Admission - 09/16/21 1734       Core Components/Risk Factors/Patient Goals on Admission    Weight Management Yes;Weight Loss    Intervention Weight Management: Develop a combined nutrition and exercise program designed to reach desired caloric intake, while maintaining appropriate intake of nutrient and fiber, sodium and fats, and appropriate energy expenditure required for the weight goal.;Weight Management: Provide education and appropriate resources to help participant work on and attain dietary goals.;Weight Management/Obesity: Establish reasonable short term and long term weight goals.    Admit Weight 205 lb 4.8 oz (93.1 kg)    Goal Weight: Short Term 200 lb (90.7 kg)    Goal Weight: Long Term 186 lb (84.4 kg)    Expected Outcomes Short Term: Continue to assess and modify interventions until short term weight is achieved;Weight Loss: Understanding of general recommendations for a balanced deficit meal plan, which promotes 1-2 lb weight loss per week and includes a negative energy balance of [phone] kcal/d;Understanding of distribution of calorie intake throughout the day with the consumption of 4-5  meals/snacks;Long Term: Adherence to nutrition and physical activity/exercise program aimed toward attainment of established weight goal;Understanding recommendations for meals to include 15-35% energy as protein, 25-35% energy from fat, 35-60% energy from carbohydrates, less than $RemoveB'200mg'luITzHFt$  of dietary cholesterol, 20-35 gm of total fiber daily    Lipids Yes    Intervention Provide education and support for participant on nutrition & aerobic/resistive exercise along with prescribed medications to achieve LDL '70mg'$ , HDL >$Remo'40mg'RxzUm$ .    Expected Outcomes Short Term: Participant states understanding of desired cholesterol values and is compliant with medications prescribed. Participant is following exercise prescription and nutrition guidelines.;Long Term: Cholesterol controlled with medications as prescribed, with individualized exercise RX and with personalized nutrition plan. Value goals: LDL < $Rem'70mg'sAMa$ , HDL > 40 mg.             Education:Diabetes - Individual verbal and written instruction to review signs/symptoms of diabetes, desired ranges of glucose level fasting, after meals and with exercise. Acknowledge that pre and post exercise glucose checks will be done for 3 sessions at entry of program.   Core Components/Risk Factors/Patient Goals Review:   Goals and Risk Factor Review     Row Name 10/16/21 1006 11/04/21 1013 12/11/21 1023 12/25/21 1006       Core Components/Risk Factors/Patient Goals Review   Personal Goals Review Weight Management/Obesity Weight Management/Obesity;Lipids Weight Management/Obesity;Lipids Weight Management/Obesity;Lipids    Review Carr would like to lose a few pounds. He needs to weed out some snacks that can be attrributing to his weight maintaining. His most likable snack is peanuts and states he may eat to much of them. Informed him that peanuts and nuts in general have alot of calories. Lawrance reports it has been stable, fluctuating a couple of pounds. He reports that he got  blood work last week and his cholesterol is still high. He is starting semaglutide per his doctors recommendations for this. He continues to work on diet and exercise. Oday reports it has been stable, fluctuating a couple of pounds; he reports since starting rehab it has been around 205lbs. He has been tolerating semaglutide with no issues and will go up in his dose soon. He is taking all of his medications as directed with no issues. He continues to work on diet and exercise. Miquel reports his weight continues to be stable, fluctuating a couple of pounds; he reports since starting rehab it  has been around 205lbs. He has been tolerating semaglutide, but it has been bothering his stomach at this higher dose - he is going to let his MD know - discussed GERD MNT. He continues to work on diet and exercise.    Expected Outcomes Short: eat less peanuts. Long: reach weight goal. ST: start taking semaglutide and monitor for side effects LT: continue to monitor risk factors ST: when he increases semaglutide continue to monitor for side effects LT: continue to monitor risk factors ST: let his MD know about the side effects of his medications LT: continue to monitor risk factors after graduating             Core Components/Risk Factors/Patient Goals at Discharge (Final Review):   Goals and Risk Factor Review - 12/25/21 1006       Core Components/Risk Factors/Patient Goals Review   Personal Goals Review Weight Management/Obesity;Lipids    Review Eljay reports his weight continues to be stable, fluctuating a couple of pounds; he reports since starting rehab it has been around 205lbs. He has been tolerating semaglutide, but it has been bothering his stomach at this higher dose - he is going to let his MD know - discussed GERD MNT. He continues to work on diet and exercise.    Expected Outcomes ST: let his MD know about the side effects of his medications LT: continue to monitor risk factors after graduating              ITP Comments:  ITP Comments     Row Name 09/11/21 1103 09/16/21 1714 09/18/21 1002 09/25/21 0815 09/30/21 1145   ITP Comments Virtual orientation call completed today. he has an appointment on Date: 09/16/2021  for EP eval and gym Orientation.  Documentation of diagnosis can be found in [facility] Date: 08/01/2021 . Completed 6MWT and gym orientation. Initial ITP created and sent for review to Dr. Emily Filbert, Medical Director. First full day of exercise!  Patient was oriented to gym and equipment including functions, settings, policies, and procedures.  Patient's individual exercise prescription and treatment plan were reviewed.  All starting workloads were established based on the results of the 6 minute walk test done at initial orientation visit.  The plan for exercise progression was also introduced and progression will be customized based on patient's performance and goals. 30 Day review completed. Medical Director ITP review done, changes made as directed, and signed approval by Medical Director.     New Completed initial RD consultation    Row Name 10/23/21 0936 11/20/21 0749 12/18/21 0651 01/03/22 1020     ITP Comments 30 Day review completed. Medical Director ITP review done, changes made as directed, and signed approval by Medical Director. 30 Day review completed. Medical Director ITP review done, changes made as directed, and signed approval by Medical Director. 30 Day review completed. Medical Director ITP review done, changes made as directed, and signed approval by Medical Director. Leul graduated today from  rehab with 35 sessions completed.  Details of the patient's exercise prescription and what He needs to do in order to continue the prescription and progress were discussed with patient.  Patient was given a copy of prescription and goals.  Patient verbalized understanding.  Cortez plans to continue to exercise by walking .             Comments: Discharge ITP

## 2022-01-03 NOTE — Progress Notes (Signed)
Discharge Summary: Antonio [name] (DOB [birth year])   [name] graduated today from  rehab with 35 sessions completed.  Details of the patient's exercise prescription and what He needs to do in order to continue the prescription and progress were discussed with patient.  Patient was given a copy of prescription and goals.  Patient verbalized understanding.  [name] plans to continue to exercise by walking.   [place] Name 09/16/21 1714 12/23/21 1038       6 Minute Walk   Phase Initial Discharge    Distance 1760 feet 2055 feet    Distance % Change -- 16.7 %    Distance Feet Change -- 295 ft    Walk Time 6 minutes 6 minutes    # of Rest Breaks 0 0    MPH 3.33 3.89    METS 4.2 4.77    RPE 12 13    Perceived Dyspnea  0 0    VO2 Peak 14.69 16.71    Symptoms No No    Resting HR 71 bpm 80 bpm    Resting BP 104/70 118/76    Resting Oxygen Saturation  96 % 97 %    Exercise Oxygen Saturation  during 6 min walk 96 % 96 %    Max Ex. HR 82 bpm 119 bpm    Max Ex. BP 134/70 138/62    2 Minute Post BP 124/72 120/60

## 2022-02-11 ENCOUNTER — Telehealth: Payer: Self-pay | Admitting: Cardiology

## 2022-02-11 MED ORDER — ATORVASTATIN CALCIUM 80 MG PO TABS: 80.0000 mg | ORAL_TABLET | Freq: Every day | ORAL | 3 refills | Status: DC

## 2022-02-11 MED ORDER — METOPROLOL TARTRATE 25 MG PO TABS: 25.0000 mg | ORAL_TABLET | Freq: Two times a day (BID) | ORAL | 3 refills | Status: DC

## 2022-02-11 NOTE — Telephone Encounter (Signed)
*  STAT* If patient is at the pharmacy, call can be transferred to refill team.   1. Which medications need to be refilled? (please list name of each medication and dose if known)   atorvastatin (LIPITOR) 80 MG tablet    metoprolol tartrate (LOPRESSOR) 25 MG tablet    2. Which pharmacy/location (including street and city if local pharmacy) is medication to be sent to?  [place], [place]       3. Do they need a 30 day or 90 day supply?  90 day

## 2022-03-24 ENCOUNTER — Other Ambulatory Visit: Payer: Self-pay

## 2022-03-25 ENCOUNTER — Other Ambulatory Visit: Payer: Self-pay

## 2022-03-27 ENCOUNTER — Ambulatory Visit: Payer: Self-pay | Attending: Cardiology

## 2022-03-27 DIAGNOSIS — E785 Hyperlipidemia, unspecified: Secondary | ICD-10-CM

## 2022-03-27 LAB — LIPID PANEL
Chol/HDL Ratio: 3.2 ratio (ref 0.0–5.0)
Cholesterol, Total: 140 mg/dL (ref 100–199)
HDL: 44 mg/dL (ref >39)
LDL Chol Calc (NIH): 84 mg/dL (ref 0–99)
Triglycerides: 58 mg/dL (ref 0–149)
VLDL Cholesterol Cal: 12 mg/dL (ref 5–40)

## 2022-04-08 ENCOUNTER — Encounter: Payer: Self-pay | Admitting: *Deleted

## 2022-05-08 ENCOUNTER — Other Ambulatory Visit: Payer: Self-pay | Admitting: Cardiology

## 2022-05-27 ENCOUNTER — Ambulatory Visit (INDEPENDENT_AMBULATORY_CARE_PROVIDER_SITE_OTHER): Payer: Self-pay | Admitting: Dermatology

## 2022-05-27 DIAGNOSIS — Z85828 Personal history of other malignant neoplasm of skin: Secondary | ICD-10-CM

## 2022-05-27 DIAGNOSIS — L570 Actinic keratosis: Secondary | ICD-10-CM

## 2022-05-27 DIAGNOSIS — D229 Melanocytic nevi, unspecified: Secondary | ICD-10-CM

## 2022-05-27 DIAGNOSIS — L308 Other specified dermatitis: Secondary | ICD-10-CM

## 2022-05-27 DIAGNOSIS — L820 Inflamed seborrheic keratosis: Secondary | ICD-10-CM

## 2022-05-27 DIAGNOSIS — Z1283 Encounter for screening for malignant neoplasm of skin: Secondary | ICD-10-CM

## 2022-05-27 DIAGNOSIS — L853 Xerosis cutis: Secondary | ICD-10-CM

## 2022-05-27 DIAGNOSIS — L578 Other skin changes due to chronic exposure to nonionizing radiation: Secondary | ICD-10-CM

## 2022-05-27 DIAGNOSIS — L814 Other melanin hyperpigmentation: Secondary | ICD-10-CM

## 2022-05-27 DIAGNOSIS — L821 Other seborrheic keratosis: Secondary | ICD-10-CM

## 2022-05-27 DIAGNOSIS — R238 Other skin changes: Secondary | ICD-10-CM

## 2022-05-27 DIAGNOSIS — D489 Neoplasm of uncertain behavior, unspecified: Secondary | ICD-10-CM

## 2022-05-27 HISTORY — DX: Actinic keratosis: L57.0

## 2022-05-27 NOTE — Patient Instructions (Addendum)
Biopsy Wound Care Instructions  Leave the original bandage on for 24 hours if possible.  If the bandage becomes soaked or soiled before that time, it is OK to remove it and examine the wound.  A small amount of post-operative bleeding is normal.  If excessive bleeding occurs, remove the bandage, place gauze over the site and apply continuous pressure (no peeking) over the area for 30 minutes. If this does not work, please call our clinic as soon as possible or page your doctor if it is after hours.   Once a day, cleanse the wound with soap and water. It is fine to shower. If a thick crust develops you may use a Q-tip dipped into dilute hydrogen peroxide (mix 1:1 with water) to dissolve it.  Hydrogen peroxide can slow the healing process, so use it only as needed.    After washing, apply petroleum jelly (Vaseline) or an antibiotic ointment if your doctor prescribed one for you, followed by a bandage.    For best healing, the wound should be covered with a layer of ointment at all times. If you are not able to keep the area covered with a bandage to hold the ointment in place, this may mean re-applying the ointment several times a day.  Continue this wound care until the wound has healed and is no longer open.   Itching and mild discomfort is normal during the healing process. However, if you develop pain or severe itching, please call our office.   If you have any discomfort, you can take Tylenol (acetaminophen) or ibuprofen as directed on the bottle. (Please do not take these if you have an allergy to them or cannot take them for another reason).  Some redness, tenderness and white or yellow material in the wound is normal healing.  If the area becomes very sore and red, or develops a thick yellow-green material (pus), it may be infected; please notify us.    If you have stitches, return to clinic as directed to have the stitches removed. You will continue wound care for 2-3 days after the stitches  are removed.   Wound healing continues for up to one year following surgery. It is not unusual to experience pain in the scar from time to time during the interval.  If the pain becomes severe or the scar thickens, you should notify the office.    A slight amount of redness in a scar is expected for the first six months.  After six months, the redness will fade and the scar will soften and fade.  The color difference becomes less noticeable with time.  If there are any problems, return for a post-op surgery check at your earliest convenience.  To improve the appearance of the scar, you can use silicone scar gel, cream, or sheets (such as Mederma or Serica) every night for up to one year. These are available over the counter (without a prescription).  Please call our office at [phone] for any questions or concerns.      Gentle Skin Care Guide  1. Bathe no more than once a day.  2. Avoid bathing in hot water  3. Use a mild soap like Dove, Vanicream, Cetaphil, CeraVe. Can use Lever 2000 or Cetaphil antibacterial soap  4. Use soap only where you need it. On most days, use it under your arms, between your legs, and on your feet. Let the water rinse other areas unless visibly dirty.  5. When you get out of the  bath/shower, use a towel to gently blot your skin dry, don't rub it.  6. While your skin is still a little damp, apply a moisturizing cream such as Vanicream, CeraVe, Cetaphil, Eucerin, Sarna lotion or plain Vaseline Jelly. For hands apply Neutrogena [place] Hand Cream or Excipial Hand Cream.  7. Reapply moisturizer any time you start to itch or feel dry.  8. Sometimes using free and clear laundry detergents can be helpful. Fabric softener sheets should be avoided. Downy Free & Gentle liquid, or any liquid fabric softener that is free of dyes and perfumes, it acceptable to use  9. If your doctor has given you prescription creams you may apply moisturizers over them        Seborrheic Keratosis  What causes seborrheic keratoses? Seborrheic keratoses are harmless, common skin growths that first appear during adult life.  As time goes by, more growths appear.  Some people may develop a large number of them.  Seborrheic keratoses appear on both covered and uncovered body parts.  They are not caused by sunlight.  The tendency to develop seborrheic keratoses can be inherited.  They vary in color from skin-colored to gray, brown, or even black.  They can be either smooth or have a rough, warty surface.   Seborrheic keratoses are superficial and look as if they were stuck on the skin.  Under the microscope this type of keratosis looks like layers upon layers of skin.  That is why at times the top layer may seem to fall off, but the rest of the growth remains and re-grows.    Treatment Seborrheic keratoses do not need to be treated, but can easily be removed in the office.  Seborrheic keratoses often cause symptoms when they rub on clothing or jewelry.  Lesions can be in the way of shaving.  If they become inflamed, they can cause itching, soreness, or burning.  Removal of a seborrheic keratosis can be accomplished by freezing, burning, or surgery. If any spot bleeds, scabs, or grows rapidly, please return to have it checked, as these can be an indication of a skin cancer.    Melanoma ABCDEs  Melanoma is the most dangerous type of skin cancer, and is the leading cause of death from skin disease.  You are more likely to develop melanoma if you: Have light-colored skin, light-colored eyes, or red or blond hair Spend a lot of time in the sun Tan regularly, either outdoors or in a tanning bed Have had blistering sunburns, especially during childhood Have a close family member who has had a melanoma Have atypical moles or large birthmarks  Early detection of melanoma is key since treatment is typically straightforward and cure rates are extremely high if we catch  it early.   The first sign of melanoma is often a change in a mole or a new dark spot.  The ABCDE system is a way of remembering the signs of melanoma.  A for asymmetry:  The two halves do not match. B for border:  The edges of the growth are irregular. C for color:  A mixture of colors are present instead of an even brown color. D for diameter:  Melanomas are usually (but not always) greater than 33m - the size of a pencil eraser. E for evolution:  The spot keeps changing in size, shape, and color.  Please check your skin once per month between visits. You can use a small mirror in front and a large mirror behind you to keep  an eye on the back side or your body.   If you see any new or changing lesions before your next follow-up, please call to schedule a visit.  Please continue daily skin protection including broad spectrum sunscreen SPF 30+ to sun-exposed areas, reapplying every 2 hours as needed when you're outdoors.   Staying in the shade or wearing long sleeves, sun glasses (UVA+UVB protection) and wide brim hats (4-inch brim around the entire circumference of the hat) are also recommended for sun protection.    Due to recent changes in healthcare laws, you may see results of your pathology and/or laboratory studies on MyChart before the doctors have had a chance to review them. We understand that in some cases there may be results that are confusing or concerning to you. Please understand that not all results are received at the same time and often the doctors may need to interpret multiple results in order to provide you with the best plan of care or course of treatment. Therefore, we ask that you please give [place] 2 business days to thoroughly review all your results before contacting the office for clarification. Should we see a critical lab result, you will be contacted sooner.   If You Need Anything After Your Visit  If you have any questions or concerns for your doctor, please call  our main line at [phone] and press option 4 to reach your doctor's medical assistant. If no one answers, please leave a voicemail as directed and we will return your call as soon as possible. Messages left after 4 pm will be answered the following business day.   You may also send [place] a message via [place]. We typically respond to MyChart messages within 1-2 business days.  For prescription refills, please ask your pharmacy to contact our office. Our fax number is [phone].  If you have an urgent issue when the clinic is closed that cannot wait until the next business day, you can page your doctor at the number below.    Please note that while we do our best to be available for urgent issues outside of office hours, we are not available 24/7.   If you have an urgent issue and are unable to reach [place], you may choose to seek medical care at your doctor's office, retail clinic, urgent care center, or emergency room.  If you have a medical emergency, please immediately call 911 or go to the emergency department.  Pager Numbers  - Dr. Nehemiah Massed: [phone]  - Dr. Laurence Ferrari: [phone]  - Dr. Nicole Kindred: [phone]  In the event of inclement weather, please call our main line at [phone] for an update on the status of any delays or closures.  Dermatology Medication Tips: Please keep the boxes that topical medications come in in order to help keep track of the instructions about where and how to use these. Pharmacies typically print the medication instructions only on the boxes and not directly on the medication tubes.   If your medication is too expensive, please contact our office at [phone] option 4 or send [place] a message through [place].   We are unable to tell what your co-pay for medications will be in advance as this is different depending on your insurance coverage. However, we may be able to find a substitute medication at lower cost or fill out paperwork to get insurance to  cover a needed medication.   If a prior authorization is required to get your medication covered by your insurance company,  please allow [place] 1-2 business days to complete this process.  Drug prices often vary depending on where the prescription is filled and some pharmacies may offer cheaper prices.  The website www.goodrx.com contains coupons for medications through different pharmacies. The prices here do not account for what the cost may be with help from insurance (it may be cheaper with your insurance), but the website can give you the price if you did not use any insurance.  - You can print the associated coupon and take it with your prescription to the pharmacy.  - You may also stop by our office during regular business hours and pick up a GoodRx coupon card.  - If you need your prescription sent electronically to a different pharmacy, notify our office through [facility] or by phone at [phone] option 4.     Si Usted Necesita Algo Despus de Su Visita  Tambin puede enviarnos un mensaje a travs de Pharmacist, community. Por lo general respondemos a los mensajes de MyChart en el transcurso de 1 a 2 das hbiles.  Para renovar recetas, por favor pida a su farmacia que se ponga en contacto con nuestra oficina. Harland Dingwall de fax es [place] [phone].  Si tiene un asunto urgente cuando la clnica est cerrada y que no puede esperar hasta el siguiente da hbil, puede llamar/localizar a su doctor(a) al nmero que aparece a continuacin.   Por favor, tenga en cuenta que aunque hacemos todo lo posible para estar disponibles para asuntos urgentes fuera del horario de [place], no estamos disponibles las 24 horas del da, los 7 das de la [place].   Si tiene un problema urgente y no puede comunicarse con nosotros, puede optar por buscar atencin mdica  en el consultorio de su doctor(a), en una clnica privada, en un centro de atencin urgente o en una sala de emergencias.  Si tiene Conservator, museum/gallery, por favor llame inmediatamente al 911 o vaya a la sala de emergencias.  Nmeros de bper  - Dr. Nehemiah Massed: [phone]  - Dra. Moye: [phone]  - Dra. Nicole Kindred: [phone]  En caso de inclemencias del [place], por favor llame a Johnsie Kindred principal al [phone] para una actualizacin sobre el [place] de cualquier retraso o cierre.  Consejos para la medicacin en dermatologa: Por favor, guarde las cajas en las que vienen los medicamentos de uso tpico para ayudarle a seguir las instrucciones sobre dnde y cmo usarlos. Las farmacias generalmente imprimen las instrucciones del medicamento slo en las cajas y no directamente en los tubos del [place].   Si su medicamento es muy caro, por favor, pngase en contacto con Zigmund Daniel llamando al [phone] y presione la opcin 4 o envenos un mensaje a travs de Pharmacist, community.   No podemos decirle cul ser su copago por los medicamentos por adelantado ya que esto es diferente dependiendo de la cobertura de su seguro. Sin embargo, es posible que podamos encontrar un medicamento sustituto a Electrical engineer un formulario para que el seguro cubra el medicamento que se considera necesario.   Si se requiere una autorizacin previa para que su compaa de seguros [place] su medicamento, por favor permtanos de 1 a 2 das hbiles para completar este proceso.  Los precios de los medicamentos varan con frecuencia dependiendo del Environmental consultant de dnde se surte la receta y alguna farmacias pueden ofrecer precios ms baratos.  El sitio web www.goodrx.com tiene cupones para medicamentos de Airline pilot. Los precios aqu no tienen en cuenta lo que  podra costar con la ayuda del seguro (puede ser ms barato con su seguro), pero el sitio web puede darle el precio si no Field seismologist.  - Puede imprimir el cupn correspondiente y llevarlo con su receta a la farmacia.  - Tambin puede pasar por nuestra oficina durante el  horario de atencin regular y Charity fundraiser una tarjeta de cupones de GoodRx.  - Si necesita que su receta se enve electrnicamente a una farmacia diferente, informe a nuestra oficina a travs de MyChart de [place] o por telfono llamando al [phone] y presione la opcin 4.

## 2022-05-27 NOTE — Progress Notes (Signed)
Follow-Up Visit   Subjective  Antonio [name] is a 62 y.o. male who presents for the following: Annual Exam (Patient reports a few spots on back , tender spot at nose he would like checked. Scaly spots at right cheek area. ). Spot on nose just came up.   The patient presents for Upper Body Skin Exam (UBSE) for skin cancer screening and mole check.  The patient has spots, moles and lesions to be evaluated, some may be new or changing and the patient has concerns that these could be cancer.   The following portions of the chart were reviewed this encounter and updated as appropriate:      Review of Systems: No other skin or systemic complaints except as noted in HPI or Assessment and Plan.   Objective  Well appearing patient in no apparent distress; mood and affect are within normal limits.  All skin waist up examined.  back x 8 (8), right zygoma x 3, right foreahead x 1,right temple x 2, left temple x 1, right hand dorsum x 2, left hand dorsum x 1, left antihelix x 1 (11) Erythematous scaly macules and papules  right posterior shoulder 1.9 x 1.5 mm pink scaly patch      Nose 2 mm blanching pink macule    Assessment & Plan  Inflamed seborrheic keratosis (19) right zygoma x 3, right foreahead x 1,right temple x 2, left temple x 1, right hand dorsum x 2, left hand dorsum x 1, left antihelix x 1 (11); back x 8 (8)  Vs AKs  Symptomatic, irritating, patient would like treated.  Destruction of lesion - back x 8, right zygoma x 3, right foreahead x 1,right temple x 2, left temple x 1, right hand dorsum x 2, left hand dorsum x 1, left antihelix x 1  Destruction method: cryotherapy   Informed consent: discussed and consent obtained   Timeout:  patient name, date of birth, surgical site, and procedure verified Lesion destroyed using liquid nitrogen: Yes   Region frozen until ice ball extended beyond lesion: Yes   Outcome: patient tolerated procedure well with no complications    Post-procedure details: wound care instructions given   Additional details:  Prior to procedure, discussed risks of blister formation, small wound, skin dyspigmentation, or rare scar following cryotherapy. Recommend Vaseline ointment to treated areas while healing.   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 Anesthesia: the lesion was anesthetized in a standard fashion   Anesthetic:  1% lidocaine w/ epinephrine 1-100,000 buffered w/ 8.4% NaHCO3 Instrument used: flexible razor blade   Hemostasis achieved with: pressure, aluminum chloride and electrodesiccation   Outcome: patient tolerated procedure well   Post-procedure details: wound care instructions given   Post-procedure details comment:  Ointment and small bandage applied  Specimen 1 - Surgical pathology Differential Diagnosis: Isk r/o bcc  Check Margins: No  Isk r/o BCC  Inflammatory papule Nose  Resolving   Benign. Observe    Lentigines - Scattered tan macules - Due to sun exposure - Benign-appearing, observe - Recommend daily broad spectrum sunscreen SPF 30+ to sun-exposed areas, reapply every 2 hours as needed. - Call for any changes  Seborrheic Keratoses Right hand  - Stuck-on, waxy, tan-brown papules and/or plaques  - Benign-appearing - Discussed benign etiology and prognosis. - Observe -  Call for any changes  Xerosis - diffuse xerotic patches - recommend gentle, hydrating skin care - gentle skin care handout given  Melanocytic Nevi - Tan-brown and/or pink-flesh-colored symmetric macules and papules - Benign appearing on exam today - Observation - Call clinic for new or changing moles - Recommend daily use of broad spectrum spf 30+ sunscreen to sun-exposed areas.   Hemangiomas - Red papules -  Discussed benign nature - Observe - Call for any changes  Actinic Damage - Chronic condition, secondary to cumulative UV/sun exposure - diffuse scaly erythematous macules with underlying dyspigmentation - Recommend daily broad spectrum sunscreen SPF 30+ to sun-exposed areas, reapply every 2 hours as needed.  - Staying in the shade or wearing long sleeves, sun glasses (UVA+UVB protection) and wide brim hats (4-inch brim around the entire circumference of the hat) are also recommended for sun protection.  - Call for new or changing lesions.  History of Basal Cell Carcinoma of the Skin Multiple sites see history  - No evidence of recurrence today - Recommend regular full body skin exams - Recommend daily broad spectrum sunscreen SPF 30+ to sun-exposed areas, reapply every 2 hours as needed.  - Call if any new or changing lesions are noted between office visits  Skin cancer screening performed today. Return in about 6 months (around 11/25/2022) for upper body exam . I, Ruthell Rummage, CMA, am acting as scribe for Brendolyn Patty, MD.  Documentation: I have reviewed the above documentation for accuracy and completeness, and I agree with the above.  Brendolyn Patty MD

## 2022-06-02 ENCOUNTER — Telehealth: Payer: Self-pay

## 2022-06-02 ENCOUNTER — Other Ambulatory Visit: Payer: Self-pay | Admitting: Physician Assistant

## 2022-06-02 NOTE — Telephone Encounter (Signed)
-----  Message from Brendolyn Patty, MD sent at 06/02/2022  9:36 AM EST ----- Skin , right posterior shoulder [place] with inflammation (dermatitis), cryotherapy if it recurs - please call patient

## 2022-06-02 NOTE — Telephone Encounter (Signed)
Left pt msg to call for bx result/sh °

## 2022-06-04 NOTE — Telephone Encounter (Signed)
Advised pt of bx results/sh ?

## 2022-07-09 ENCOUNTER — Other Ambulatory Visit: Payer: Self-pay | Admitting: Cardiology

## 2022-07-25 ENCOUNTER — Other Ambulatory Visit: Payer: Self-pay | Admitting: Physician Assistant

## 2022-07-27 ENCOUNTER — Other Ambulatory Visit: Payer: Self-pay | Admitting: Cardiology

## 2022-11-04 ENCOUNTER — Ambulatory Visit: Payer: Self-pay | Admitting: Dermatology

## 2022-12-16 ENCOUNTER — Ambulatory Visit: Payer: Self-pay | Attending: Cardiology | Admitting: Cardiology

## 2022-12-16 ENCOUNTER — Encounter: Payer: Self-pay | Admitting: Cardiology

## 2022-12-16 VITALS — BP 122/76 | HR 65 | Ht 69.0 in | Wt 203.0 lb

## 2022-12-16 DIAGNOSIS — I10 Essential (primary) hypertension: Secondary | ICD-10-CM

## 2022-12-16 DIAGNOSIS — Z79899 Other long term (current) drug therapy: Secondary | ICD-10-CM

## 2022-12-16 DIAGNOSIS — E785 Hyperlipidemia, unspecified: Secondary | ICD-10-CM

## 2022-12-16 MED ORDER — ROSUVASTATIN CALCIUM 20 MG PO TABS: 20.0000 mg | ORAL_TABLET | Freq: Every day | ORAL | 3 refills | Status: AC

## 2022-12-16 NOTE — Patient Instructions (Signed)
Medication Instructions:  Please discontinue your Atorvastatin and start Crestor 20 mg once a day. Continue all other medications as listed.  *If you need a refill on your cardiac medications before your next appointment, please call your pharmacy*   Lab Work: Please return fastin in 3 months for a Lipid panel.  If you have labs (blood work) drawn today and your tests are completely normal, you will receive your results only by: MyChart Message (if you have MyChart) OR A paper copy in the mail If you have any lab test that is abnormal or we need to change your treatment, we will call you to review the results.  Follow-Up: At [facility], you and your health needs are our priority.  As part of our continuing mission to provide you with exceptional heart care, we have created designated Provider Care Teams.  These Care Teams include your primary Cardiologist (physician) and Advanced Practice Providers (APPs -  Physician Assistants and Nurse Practitioners) who all work together to provide you with the care you need, when you need it.  We recommend signing up for the patient portal called "MyChart".  Sign up information is provided on this After Visit Summary.  MyChart is used to connect with patients for Virtual Visits (Telemedicine).  Patients are able to view lab/test results, encounter notes, upcoming appointments, etc.  Non-urgent messages can be sent to your provider as well.   To learn more about what you can do with MyChart, go to [email or web address].    Your next appointment:   1 year(s)  Provider:   Donato Schultz, MD

## 2022-12-16 NOTE — Progress Notes (Signed)
  Cardiology Office Note:  .   Date:  12/16/2022  ID:  Antonio [name], DOB [birth year], MRN [id number] PCP: Bridgette Habermann, NP  [facility] Providers Cardiologist:  Donato Schultz, MD    History of Present Illness: .   Antonio [name] is a 62 y.o. male here for follow-up coronary artery disease.  CABG times 13 July 2021-LIMA to LAD radial to OM SVG to diagonal  Previously had some dizziness but this went away after stopping amlodipine 2.5 mg.  See below for details.  Having some symptoms of achiness which she thinks may be attributed to the statin use atorvastatin 80 mg.  Willing to make a change.  Overall no anginal symptoms no chest pain fevers chills nausea vomiting syncope bleeding.  ROS: as above  Studies Reviewed: Marland Kitchen   EKG Interpretation Date/Time:  Tuesday December 16 2022 10:28:37 EDT Ventricular Rate:  64 PR Interval:  188 QRS Duration:  102 QT Interval:  430 QTC Calculation: 443 R Axis:   110  Text Interpretation: Normal sinus rhythm Left posterior fascicular block T wave abnormality, consider inferior ischemia When compared with ECG of 07-Aug-2021 06:52, Left posterior fascicular block is now Present ST no longer elevated in Inferior leads T wave inversion now evident in Inferior leads Confirmed by Donato Schultz ([id number]) on 12/16/2022 10:35:46 AM    Echocardiogram 07/2021-EF 60 to 65%, mildly dilated ascending aorta 41 mm  Risk Assessment/Calculations:            Physical Exam:   VS:  BP 122/76   Pulse 65   Ht 5\' 9"  (1.753 m)   Wt 203 lb (92.1 kg)   SpO2 96%   BMI 29.98 kg/m    Wt Readings from Last 3 Encounters:  12/16/22 203 lb (92.1 kg)  12/23/21 203 lb 3.2 oz (92.2 kg)  12/16/21 206 lb (93.4 kg)    GEN: Well nourished, well developed in no acute distress NECK: No JVD; No carotid bruits CARDIAC: CABG scar RRR, no murmurs, rubs, gallops RESPIRATORY:  Clear to auscultation without rales, wheezing or rhonchi  ABDOMEN: Soft, non-tender, non-distended EXTREMITIES:  No  edema; No deformity   ASSESSMENT AND PLAN: .    Coronary artery disease status post CABG 2023 March - Doing well.  No anginal symptoms.  Transitioned to aspirin 81 mg from Plavix 75.  Cardiac rehab completed.  He feels well with his metoprolol.  Excellent.  Hyperlipidemia - Sometimes when getting up feels like an old man taking his atorvastatin 80 mg he states.  Lets transition him over to Crestor 20 mg.  If necessary can always add Zetia.  Could also change to Repatha if necessary.  Continue co-Q10 if desired as well.  Dilated ascending aorta - Mildly dilated 41 mm on echocardiogram March or 2023.  We will continue to monitor.  Primary hypertension - Well-controlled.  Previously stopped his amlodipine 2.5 mg because of orthostatic hypotension.      Dispo: 1 yr. we will follow-up with 38-month lipid panel.  Signed, Donato Schultz, MD

## 2023-02-09 ENCOUNTER — Ambulatory Visit: Payer: Self-pay | Admitting: Cardiology

## 2023-02-10 NOTE — Progress Notes (Unsigned)
    Antonio Saran T. Julion Gatt, MD, CAQ Sports Medicine [facility] [address] [place], [address]  Phone: [phone]  FAX: [phone]  Antonio [name] - 62 y.o. male  MRN [id number]  Date of Birth: Oct 22, 1960  Date: 02/12/2023  PCP: Bridgette Habermann, NP  Referral: Bridgette Habermann, NP  No chief complaint on file.  Subjective:   Antonio [name] is a 62 y.o. very pleasant male patient with There is no height or weight on file to calculate BMI. who presents with the following:  Antonio [name] is a very friendly patient who is new to me and new to our medical practice.  He presents with some ongoing right-sided knee pain.  History is significant for NSTEMI and prior CABG.    Review of Systems is noted in the HPI, as appropriate  Objective:   There were no vitals taken for this visit.  GEN: No acute distress; alert,appropriate. PULM: Breathing comfortably in no respiratory distress PSYCH: Normally interactive.   Laboratory and Imaging Data:  Assessment and Plan:   ***

## 2023-02-12 ENCOUNTER — Encounter: Payer: Self-pay | Admitting: Family Medicine

## 2023-02-12 ENCOUNTER — Ambulatory Visit (INDEPENDENT_AMBULATORY_CARE_PROVIDER_SITE_OTHER)
Admission: RE | Admit: 2023-02-12 | Discharge: 2023-02-12 | Disposition: A | Discharge status: Home / Self Care | Payer: Self-pay | Source: Ambulatory Visit | Attending: Family Medicine | Admitting: Family Medicine

## 2023-02-12 ENCOUNTER — Ambulatory Visit (INDEPENDENT_AMBULATORY_CARE_PROVIDER_SITE_OTHER): Payer: Self-pay | Admitting: Family Medicine

## 2023-02-12 VITALS — BP 136/88 | HR 65 | Temp 98.2°F | Ht 69.0 in | Wt 210.5 lb

## 2023-02-12 DIAGNOSIS — M25561 Pain in right knee: Secondary | ICD-10-CM

## 2023-02-12 NOTE — Patient Instructions (Addendum)
Voltaren 1% gel, over the counter You can apply up to 4 times a day  This can be applied to any joint: knee, wrist, fingers, elbows, shoulders, feet and ankles. Can apply to any tendon: tennis elbow, achilles, tendon, rotator cuff or any other tendon.  Minimal is absorbed in the bloodstream: ok with oral anti-inflammatory or a blood thinner.  Cost is about 9 dollars    You should be able to take generic ibuprofen orally. (Over the counter Motrin, Advil, or Generic Ibuprofen 200 mg tablets. 3-4 tablets by mouth 3 times a day. This equals a prescription strength dose.)

## 2023-02-16 ENCOUNTER — Ambulatory Visit (INDEPENDENT_AMBULATORY_CARE_PROVIDER_SITE_OTHER): Payer: Self-pay | Admitting: Dermatology

## 2023-02-16 VITALS — BP 142/96 | HR 59

## 2023-02-16 DIAGNOSIS — Z1283 Encounter for screening for malignant neoplasm of skin: Secondary | ICD-10-CM

## 2023-02-16 DIAGNOSIS — Z85828 Personal history of other malignant neoplasm of skin: Secondary | ICD-10-CM

## 2023-02-16 DIAGNOSIS — L814 Other melanin hyperpigmentation: Secondary | ICD-10-CM

## 2023-02-16 DIAGNOSIS — L570 Actinic keratosis: Secondary | ICD-10-CM

## 2023-02-16 DIAGNOSIS — D1801 Hemangioma of skin and subcutaneous tissue: Secondary | ICD-10-CM

## 2023-02-16 DIAGNOSIS — W908XXA Exposure to other nonionizing radiation, initial encounter: Secondary | ICD-10-CM

## 2023-02-16 DIAGNOSIS — L578 Other skin changes due to chronic exposure to nonionizing radiation: Secondary | ICD-10-CM

## 2023-02-16 DIAGNOSIS — Z5111 Encounter for antineoplastic chemotherapy: Secondary | ICD-10-CM

## 2023-02-16 DIAGNOSIS — L821 Other seborrheic keratosis: Secondary | ICD-10-CM

## 2023-02-16 DIAGNOSIS — Z872 Personal history of diseases of the skin and subcutaneous tissue: Secondary | ICD-10-CM

## 2023-02-16 DIAGNOSIS — D229 Melanocytic nevi, unspecified: Secondary | ICD-10-CM

## 2023-02-16 NOTE — Patient Instructions (Addendum)
- Start 5-fluorouracil/calcipotriene cream twice a day for 5-7 days to affected areas including forehead, nose, temples, cheeks. Prescription sent to [facility]. Patient advised they will receive an email to purchase the medication online and have it sent to their home. Patient provided with handout reviewing treatment course and side effects and advised to call or message [place] on MyChart with any concerns.  Reviewed course of treatment and expected reaction.  Patient advised to expect inflammation and crusting and advised that erosions are possible.  Patient advised to be diligent with sun protection during and after treatment. Counseled to keep medication out of reach of children and pets.  5-Fluorouracil/Calcipotriene Patient Education   Actinic keratoses are the dry, red scaly spots on the skin caused by sun damage. A portion of these spots can turn into skin cancer with time, and treating them can help prevent development of skin cancer.   Treatment of these spots requires removal of the defective skin cells. There are various ways to remove actinic keratoses, including freezing with liquid nitrogen, treatment with creams, or treatment with a blue light procedure in the office.   5-fluorouracil cream is a topical cream used to treat actinic keratoses. It works by interfering with the growth of abnormal fast-growing skin cells, such as actinic keratoses. These cells peel off and are replaced by healthy ones.   5-fluorouracil/calcipotriene is a combination of the 5-fluorouracil cream with a vitamin D analog cream called calcipotriene. The calcipotriene alone does not treat actinic keratoses. However, when it is combined with 5-fluorouracil, it helps the 5-fluorouracil treat the actinic keratoses much faster so that the same results can be achieved with a much shorter treatment time.  INSTRUCTIONS FOR 5-FLUOROURACIL/CALCIPOTRIENE CREAM:   5-fluorouracil/calcipotriene cream  typically only needs to be used for 4-7 days. A thin layer should be applied twice a day to the treatment areas recommended by your physician.   If your physician prescribed you separate tubes of 5-fluourouracil and calcipotriene, apply a thin layer of 5-fluorouracil followed by a thin layer of calcipotriene.   Avoid contact with your eyes, nostrils, and mouth. Do not use 5-fluorouracil/calcipotriene cream on infected or open wounds.   You will develop redness, irritation and some crusting at areas where you have pre-cancer damage/actinic keratoses. IF YOU DEVELOP PAIN, BLEEDING, OR SIGNIFICANT CRUSTING, STOP THE TREATMENT EARLY - you have already gotten a good response and the actinic keratoses should clear up well.  Wash your hands after applying 5-fluorouracil 5% cream on your skin.   A moisturizer or sunscreen with a minimum SPF 30 should be applied each morning.   Once you have finished the treatment, you can apply a thin layer of Vaseline twice a day to irritated areas to soothe and calm the areas more quickly. If you experience significant discomfort, contact your physician.  For some patients it is necessary to repeat the treatment for best results.  SIDE EFFECTS: When using 5-fluorouracil/calcipotriene cream, you may have mild irritation, such as redness, dryness, swelling, or a mild burning sensation. This usually resolves within 2 weeks. The more actinic keratoses you have, the more redness and inflammation you can expect during treatment. Eye irritation has been reported rarely. If this occurs, please let us know.  If you have any trouble using this cream, please call the office. If you have any other questions about this information, please do not hesitate to ask me before you leave the office.  Due to recent changes in healthcare laws, you may see  results of your pathology and/or laboratory studies on MyChart before the doctors have had a chance to review them. We understand that in  some cases there may be results that are confusing or concerning to you. Please understand that not all results are received at the same time and often the doctors may need to interpret multiple results in order to provide you with the best plan of care or course of treatment. Therefore, we ask that you please give [place] 2 business days to thoroughly review all your results before contacting the office for clarification. Should we see a critical lab result, you will be contacted sooner.   If You Need Anything After Your Visit  If you have any questions or concerns for your doctor, please call our main line at [phone] and press option 4 to reach your doctor's medical assistant. If no one answers, please leave a voicemail as directed and we will return your call as soon as possible. Messages left after 4 pm will be answered the following business day.   You may also send [place] a message via MyChart. We typically respond to MyChart messages within 1-2 business days.  For prescription refills, please ask your pharmacy to contact our office. Our fax number is [phone].  If you have an urgent issue when the clinic is closed that cannot wait until the next business day, you can page your doctor at the number below.    Please note that while we do our best to be available for urgent issues outside of office hours, we are not available 24/7.   If you have an urgent issue and are unable to reach [place], you may choose to seek medical care at your doctor's office, retail clinic, urgent care center, or emergency room.  If you have a medical emergency, please immediately call 911 or go to the emergency department.  Pager Numbers  - Dr. Gwen Pounds: [phone]  - Dr. Roseanne Reno: [phone]  - Dr. Katrinka Blazing: [phone]   In the event of inclement weather, please call our main line at [phone] for an update on the status of any delays or closures.  Dermatology Medication Tips: Please keep the boxes that  topical medications come in in order to help keep track of the instructions about where and how to use these. Pharmacies typically print the medication instructions only on the boxes and not directly on the medication tubes.   If your medication is too expensive, please contact our office at [phone] option 4 or send [place] a message through MyChart.   We are unable to tell what your co-pay for medications will be in advance as this is different depending on your insurance coverage. However, we may be able to find a substitute medication at lower cost or fill out paperwork to get insurance to cover a needed medication.   If a prior authorization is required to get your medication covered by your insurance company, please allow [place] 1-2 business days to complete this process.  Drug prices often vary depending on where the prescription is filled and some pharmacies may offer cheaper prices.  The website www.goodrx.com contains coupons for medications through different pharmacies. The prices here do not account for what the cost may be with help from insurance (it may be cheaper with your insurance), but the website can give you the price if you did not use any insurance.  - You can print the associated coupon and take it with your prescription to the pharmacy.  - You  may also stop by our office during regular business hours and pick up a GoodRx coupon card.  - If you need your prescription sent electronically to a different pharmacy, notify our office through [facility] or by phone at [phone] option 4.     Si Usted Necesita Algo Despus de Su Visita  Tambin puede enviarnos un mensaje a travs de Clinical cytogeneticist. Por lo general respondemos a los mensajes de MyChart en el transcurso de 1 a 2 das hbiles.  Para renovar recetas, por favor pida a su farmacia que se ponga en contacto con nuestra oficina. Annie Sable de fax es [place] [phone].  Si tiene un asunto urgente cuando la clnica  est cerrada y que no puede esperar hasta el siguiente da hbil, puede llamar/localizar a su doctor(a) al nmero que aparece a continuacin.   Por favor, tenga en cuenta que aunque hacemos todo lo posible para estar disponibles para asuntos urgentes fuera del horario de [place], no estamos disponibles las 24 horas del da, los 7 [address]  [address] de la [place].   Si tiene un problema urgente y no puede comunicarse con nosotros, puede optar por buscar atencin mdica  en el consultorio de su doctor(a), en una clnica privada, en un centro de atencin urgente o en una sala de emergencias.  Si tiene Engineer, drilling, por favor llame inmediatamente al 911 o vaya a la sala de emergencias.  Nmeros de bper  - Dr. Gwen Pounds: [phone]  - Dra. Roseanne Reno: [phone]  - Dr. Katrinka Blazing: [phone]   En caso de inclemencias del tiempo, por favor llame a Lacy Duverney principal al [phone] para una actualizacin sobre el [place] de cualquier retraso o cierre.  Consejos para la medicacin en dermatologa: Por favor, guarde las cajas en las que vienen los medicamentos de uso tpico para ayudarle a seguir las instrucciones sobre dnde y cmo usarlos. Las farmacias generalmente imprimen las instrucciones del medicamento slo en las cajas y no directamente en los tubos del [place].   Si su medicamento es muy caro, por favor, pngase en contacto con Rolm Gala llamando al [phone] y presione la opcin 4 o envenos un mensaje a travs de Clinical cytogeneticist.   No podemos decirle cul ser su copago por los medicamentos por adelantado ya que esto es diferente dependiendo de la cobertura de su seguro. Sin embargo, es posible que podamos encontrar un medicamento sustituto a Audiological scientist un formulario para que el seguro cubra el medicamento que se considera necesario.   Si se requiere una autorizacin previa para que su compaa de seguros [place] su medicamento, por favor permtanos de 1 a 2 das hbiles para  completar [address]  Los precios de los medicamentos varan con frecuencia dependiendo del Environmental consultant de dnde se surte la receta y alguna farmacias pueden ofrecer precios ms baratos.  El sitio web www.goodrx.com tiene cupones para medicamentos de Health and safety inspector. Los precios aqu no tienen en cuenta lo que podra costar con la ayuda del seguro (puede ser ms barato con su seguro), pero el sitio web puede darle el precio si no utiliz Tourist information centre manager.  - Puede imprimir el cupn correspondiente y llevarlo con su receta a la farmacia.  - Tambin puede pasar por nuestra oficina durante el horario de atencin regular y Education officer, museum una tarjeta de cupones de GoodRx.  - Si necesita que su receta se enve electrnicamente a Psychiatrist, informe a nuestra oficina a travs de MyChart de [place] o por telfono llamando al [phone] y  presione la opcin 4.

## 2023-02-16 NOTE — Progress Notes (Signed)
Follow-Up Visit   Subjective  Antonio [name] is a 62 y.o. male who presents for the following: Skin Cancer Screening and Upper Body Skin Exam  The patient presents for Upper Body Skin Exam (UBSE) for skin cancer screening and mole check. The patient has spots, moles and lesions to be evaluated, some may be new or changing. He has a history of BCCs and Aks.    The following portions of the chart were reviewed this encounter and updated as appropriate: medications, allergies, medical history  Review of Systems:  No other skin or systemic complaints except as noted in HPI or Assessment and Plan.  Objective  Well appearing patient in no apparent distress; mood and affect are within normal limits.  All skin waist up examined. Relevant physical exam findings are noted in the Assessment and Plan.  R ant shoulder x 1, L lower neck x 1 (2), R nasal dorsum x 1, L nasal root x 1, L temple x 3, R malar cheek x 1, R zygoma x 1, R ant ear helix x 1, R mid ear helix x 2, L ear helix x 1, L antihelix x 1, R forearm x 1 (13) Pink scaly macules of the face and ears. Pink scaly macules, slightly pearly on the right posterior shoulder, left lower neck.    Assessment & Plan   AK (actinic keratosis) (15) R nasal dorsum x 1, L nasal root x 1, L temple x 3, R malar cheek x 1, R zygoma x 1, R ant ear helix x 1, R mid ear helix x 2, L ear helix x 1, L antihelix x 1, R forearm x 1 (13); R ant shoulder x 1, L lower neck x 1 (2)  AK vs Superficial BCC LN2 x 2 - right posterior shoulder, left lower neck Recheck on f/up  - Start 5-fluorouracil/calcipotriene cream twice a day for 5-7 days to affected areas including forehead, nose, temples, cheeks. Prescription sent to [facility]. Patient advised they will receive an email to purchase the medication online and have it sent to their home. Patient provided with handout reviewing treatment course and side effects and advised to call or message  [place] on MyChart with any concerns.  Reviewed course of treatment and expected reaction.  Patient advised to expect inflammation and crusting and advised that erosions are possible.  Patient advised to be diligent with sun protection during and after treatment. Counseled to keep medication out of reach of children and pets.    Actinic keratoses are precancerous spots that appear secondary to cumulative UV radiation exposure/sun exposure over time. They are chronic with expected duration over 1 year. A portion of actinic keratoses will progress to squamous cell carcinoma of the skin. It is not possible to reliably predict which spots will progress to skin cancer and so treatment is recommended to prevent development of skin cancer.  Recommend daily broad spectrum sunscreen SPF 30+ to sun-exposed areas, reapply every 2 hours as needed.  Recommend staying in the shade or wearing long sleeves, sun glasses (UVA+UVB protection) and wide brim hats (4-inch brim around the entire circumference of the hat). Call for new or changing lesions.  Destruction of lesion - R ant shoulder x 1, L lower neck x 1 (2), R nasal dorsum x 1, L nasal root x 1, L temple x 3, R malar cheek x 1, R zygoma x 1, R ant ear helix x 1, R mid ear helix x 2, L ear  helix x 1, L antihelix x 1, R forearm x 1 (13)  Destruction method: cryotherapy   Informed consent: discussed and consent obtained   Lesion destroyed using liquid nitrogen: Yes   Region frozen until ice ball extended beyond lesion: Yes   Outcome: patient tolerated procedure well with no complications   Post-procedure details: wound care instructions given   Additional details:  Prior to procedure, discussed risks of blister formation, small wound, skin dyspigmentation, or rare scar following cryotherapy. Recommend Vaseline ointment to treated areas while healing.    Skin cancer screening performed today.  ACTINIC DAMAGE WITH PRECANCEROUS ACTINIC KERATOSES Counseling for  Topical Chemotherapy Management: Patient exhibits: - Severe, confluent actinic changes with pre-cancerous actinic keratoses that is secondary to cumulative UV radiation exposure over time - Condition that is severe; chronic, not at goal. - diffuse scaly erythematous macules and papules with underlying dyspigmentation - Discussed Prescription "Field Treatment" topical Chemotherapy for Severe, Chronic Confluent Actinic Changes with Pre-Cancerous Actinic Keratoses Field treatment involves treatment of an entire area of skin that has confluent Actinic Changes (Sun/ Ultraviolet light damage) and PreCancerous Actinic Keratoses by method of PhotoDynamic Therapy (PDT) and/or prescription Topical Chemotherapy agents such as 5-fluorouracil, 5-fluorouracil/calcipotriene, and/or imiquimod.  The purpose is to decrease the number of clinically evident and subclinical PreCancerous lesions to prevent progression to development of skin cancer by chemically destroying early precancer changes that may or may not be visible.  It has been shown to reduce the risk of developing skin cancer in the treated area. As a result of treatment, redness, scaling, crusting, and open sores may occur during treatment course. One or more than one of these methods may be used and may have to be used several times to control, suppress and eliminate the PreCancerous changes. Discussed treatment course, expected reaction, and possible side effects. - Recommend daily broad spectrum sunscreen SPF 30+ to sun-exposed areas, reapply every 2 hours as needed.  - Staying in the shade or wearing long sleeves, sun glasses (UVA+UVB protection) and wide brim hats (4-inch brim around the entire circumference of the hat) are also recommended. - Call for new or changing lesions.  Lentigines, Seborrheic Keratoses, Hemangiomas - Benign normal skin lesions - Benign-appearing - Call for any changes  Melanocytic Nevi - Tan-brown and/or pink-flesh-colored  symmetric macules and papules - Benign appearing on exam today - Observation - Call clinic for new or changing moles - Recommend daily use of broad spectrum spf 30+ sunscreen to sun-exposed areas.   HISTORY OF BASAL CELL CARCINOMA OF THE SKIN Right upper lip/inf nasolabial, 2019 Right spinal upper back, 2021 - No evidence of recurrence today - Recommend regular full body skin exams - Recommend daily broad spectrum sunscreen SPF 30+ to sun-exposed areas, reapply every 2 hours as needed.  - Call if any new or changing lesions are noted between office visits   Return in about 6 months (around 08/17/2023) for UBSE, Hx BCC, Hx AKs.  ICherlyn Labella, CMA, am acting as scribe for Willeen Niece, MD .   Documentation: I have reviewed the above documentation for accuracy and completeness, and I agree with the above.  Willeen Niece, MD

## 2023-03-11 ENCOUNTER — Encounter: Payer: Self-pay | Admitting: Dermatology

## 2023-03-17 ENCOUNTER — Ambulatory Visit: Payer: Self-pay | Attending: Cardiology

## 2023-03-17 ENCOUNTER — Other Ambulatory Visit: Payer: Self-pay | Admitting: Cardiology

## 2023-03-17 DIAGNOSIS — Z79899 Other long term (current) drug therapy: Secondary | ICD-10-CM

## 2023-03-17 DIAGNOSIS — E785 Hyperlipidemia, unspecified: Secondary | ICD-10-CM

## 2023-03-18 LAB — LIPID PANEL
Chol/HDL Ratio: 3.5 ratio (ref 0.0–5.0)
Cholesterol, Total: 166 mg/dL (ref 100–199)
HDL: 47 mg/dL (ref >39)
LDL Chol Calc (NIH): 101 mg/dL — ABNORMAL HIGH (ref 0–99)
Triglycerides: 100 mg/dL (ref 0–149)
VLDL Cholesterol Cal: 18 mg/dL (ref 5–40)

## 2023-03-18 IMAGING — DX DG CHEST 1V PORT
1 series · 1 of 1 positions shown · non-contrast
Comparison: 08/01/2021; 01/31/2020

CLINICAL DATA: Post CABG

EXAM:
PORTABLE CHEST 1 VIEW

[chest]
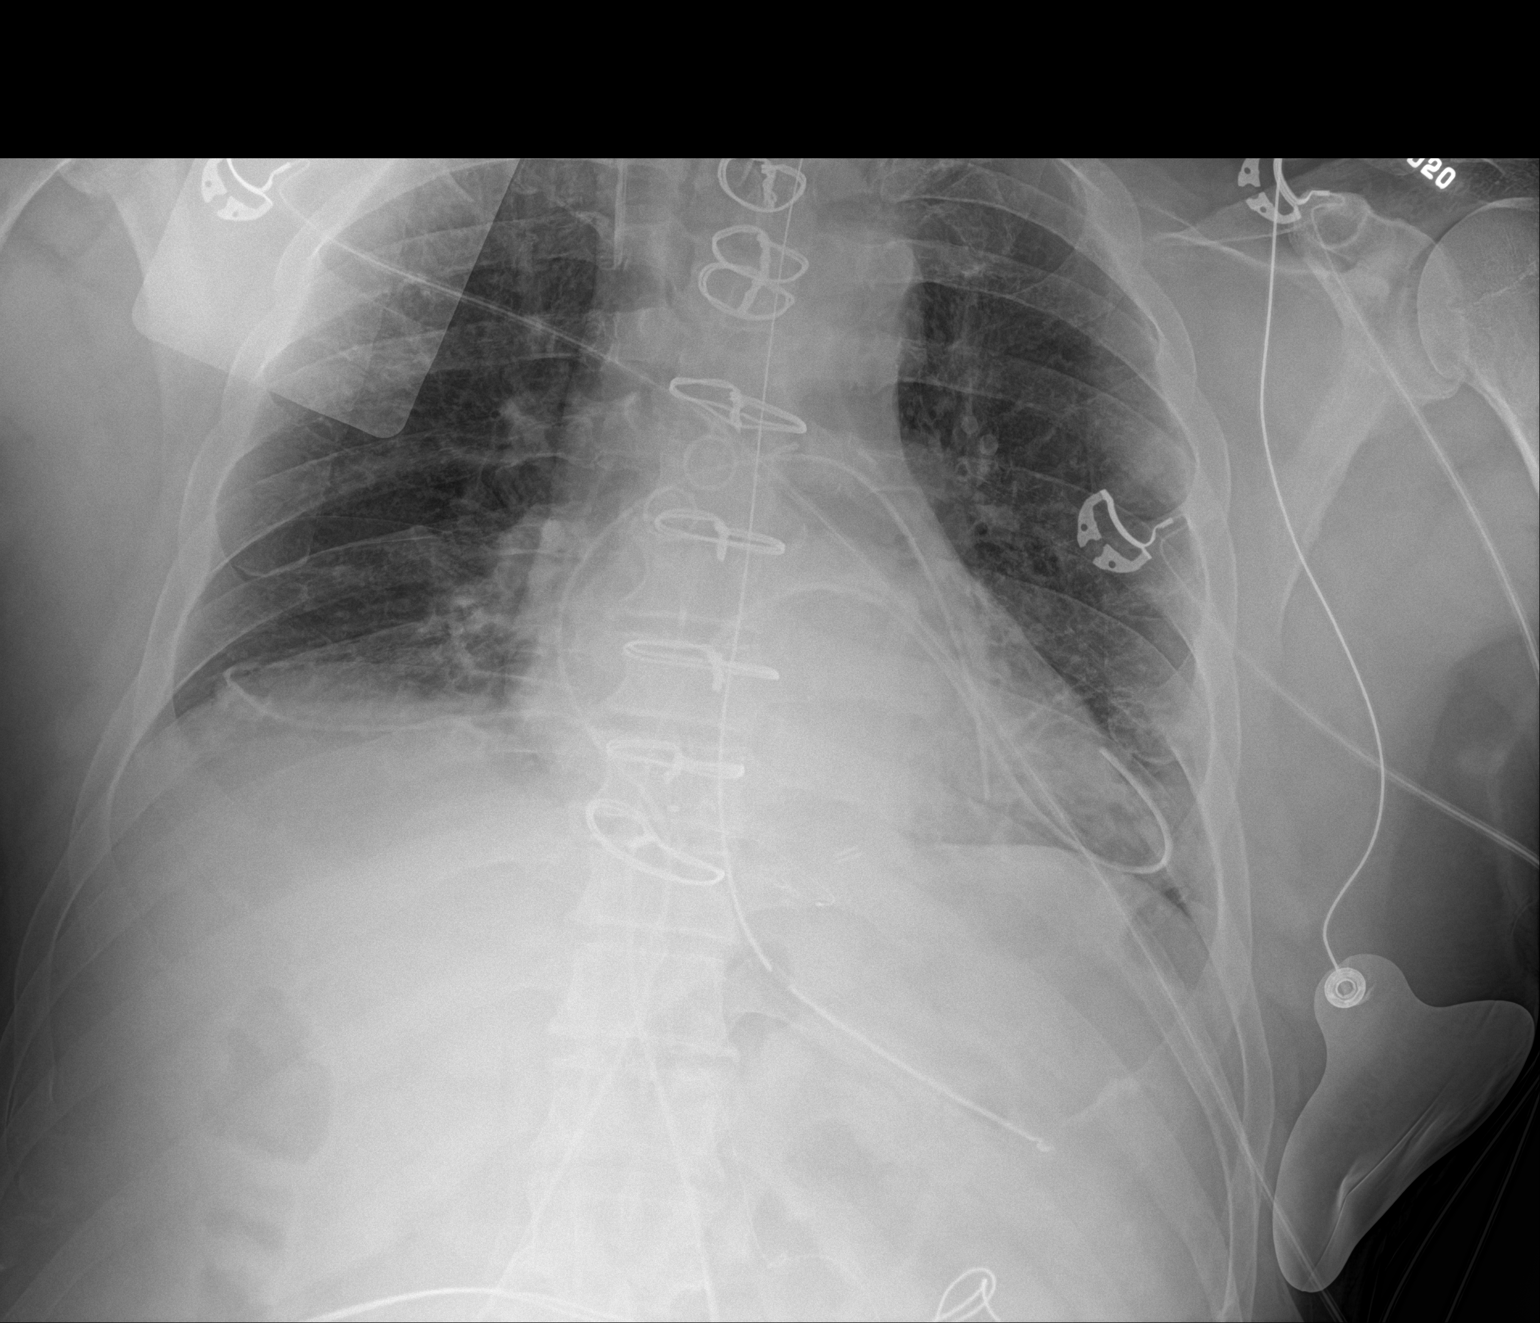

[1 of 1 positions shown; findings below may reference images not displayed]

FINDINGS: Interval median sternotomy and CABG. Enlargement of the cardiac
silhouette and mediastinal contours favored to be secondary to
decreased lung volumes. Atherosclerotic plaque within thoracic
aorta.

Endotracheal tube overlies the tracheal air column with tip superior
to the carina. Right jugular approach central venous catheter tip
projects over the mid SVC. Enteric tube tip and side port project
over the expected location of the gastric fundus. Note is made of
bilateral chest tubes and mediastinal drains. No definite
pneumothorax, though note, evaluation of the right lung apex is
obscured secondary to overlying pacer pad.

Minimal bibasilar heterogeneous opacities favored to represent
atelectasis. No pleural effusion. Pulmonary venous congestion
without frank evidence of edema. No acute osseous abnormalities.
IMPRESSION: 1. Sequela of median sternotomy and CABG with appropriately
positioned support apparatus as above. No definite pneumothorax.
2. Pulmonary venous congestion without frank evidence of edema.
3. Bibasilar opacities, likely atelectasis/contusion.

## 2023-03-19 ENCOUNTER — Other Ambulatory Visit: Payer: Self-pay | Admitting: *Deleted

## 2023-03-19 DIAGNOSIS — E785 Hyperlipidemia, unspecified: Secondary | ICD-10-CM

## 2023-03-19 IMAGING — DX DG CHEST 1V PORT
1 series · 1 of 1 positions shown · non-contrast
Comparison: Chest radiograph dated August 06, 2021

CLINICAL DATA: Status post CABG

EXAM:
PORTABLE CHEST 1 VIEW

[chest]
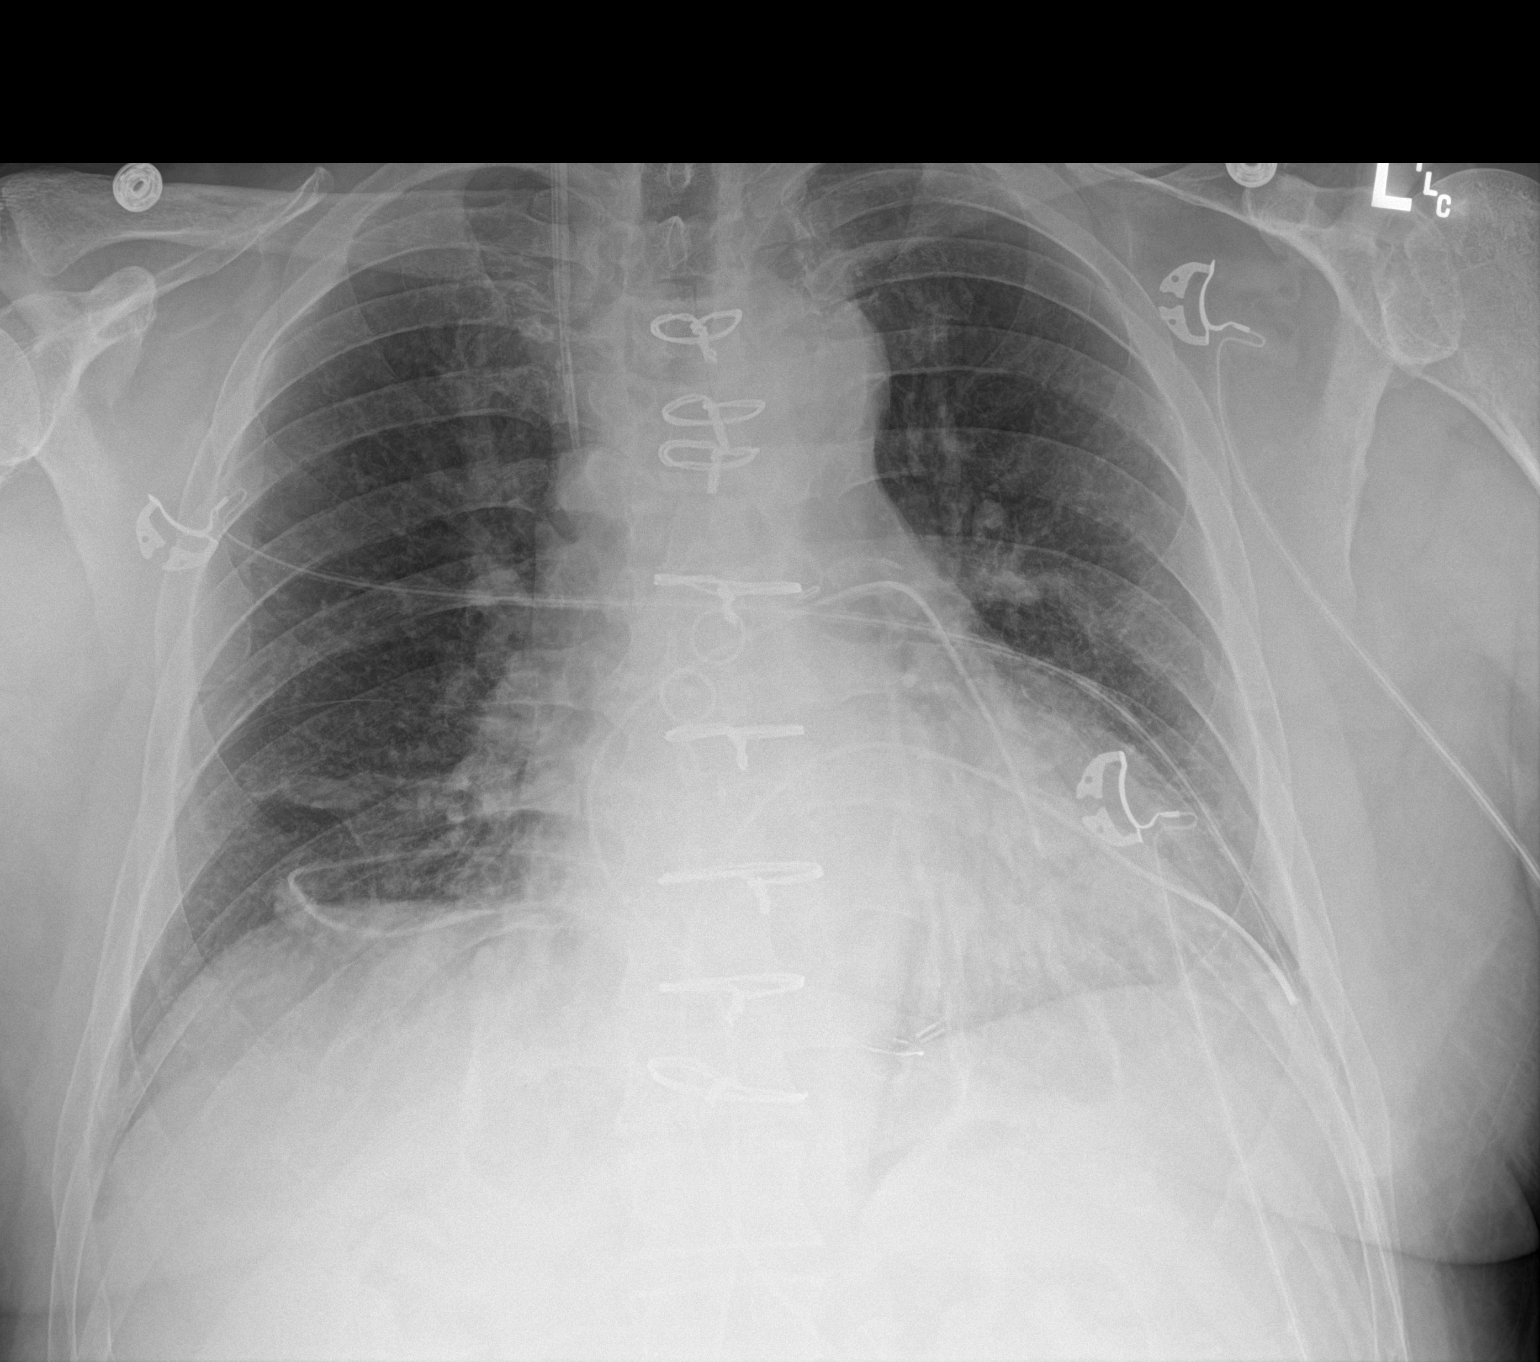

[1 of 1 positions shown; findings below may reference images not displayed]

FINDINGS: Interval removal of the endotracheal and feeding tubes. Right IJ
access central line is unchanged. Bilateral mediastinal surgical
drains are again noted. Stable cardiomegaly. Bibasilar atelectasis.
No large pleural effusion or pneumothorax. No acute osseous
abnormality. Sternotomy wires are intact.
IMPRESSION: 1. Stable cardiomegaly.
2. Interval removal of the endotracheal and feeding tubes. Remaining
lines and drains are unchanged.
3. Low lung volumes with mild bibasilar atelectasis. No large
pleural effusion or pneumothorax.

## 2023-03-20 IMAGING — DX DG CHEST 1V PORT
1 series · 1 of 1 positions shown · non-contrast
Comparison: 08/07/2021

CLINICAL DATA: Pneumothorax. Shortness of breath. Chest pain. CABG
08/06/2021.

EXAM:
PORTABLE CHEST 1 VIEW

[chest]
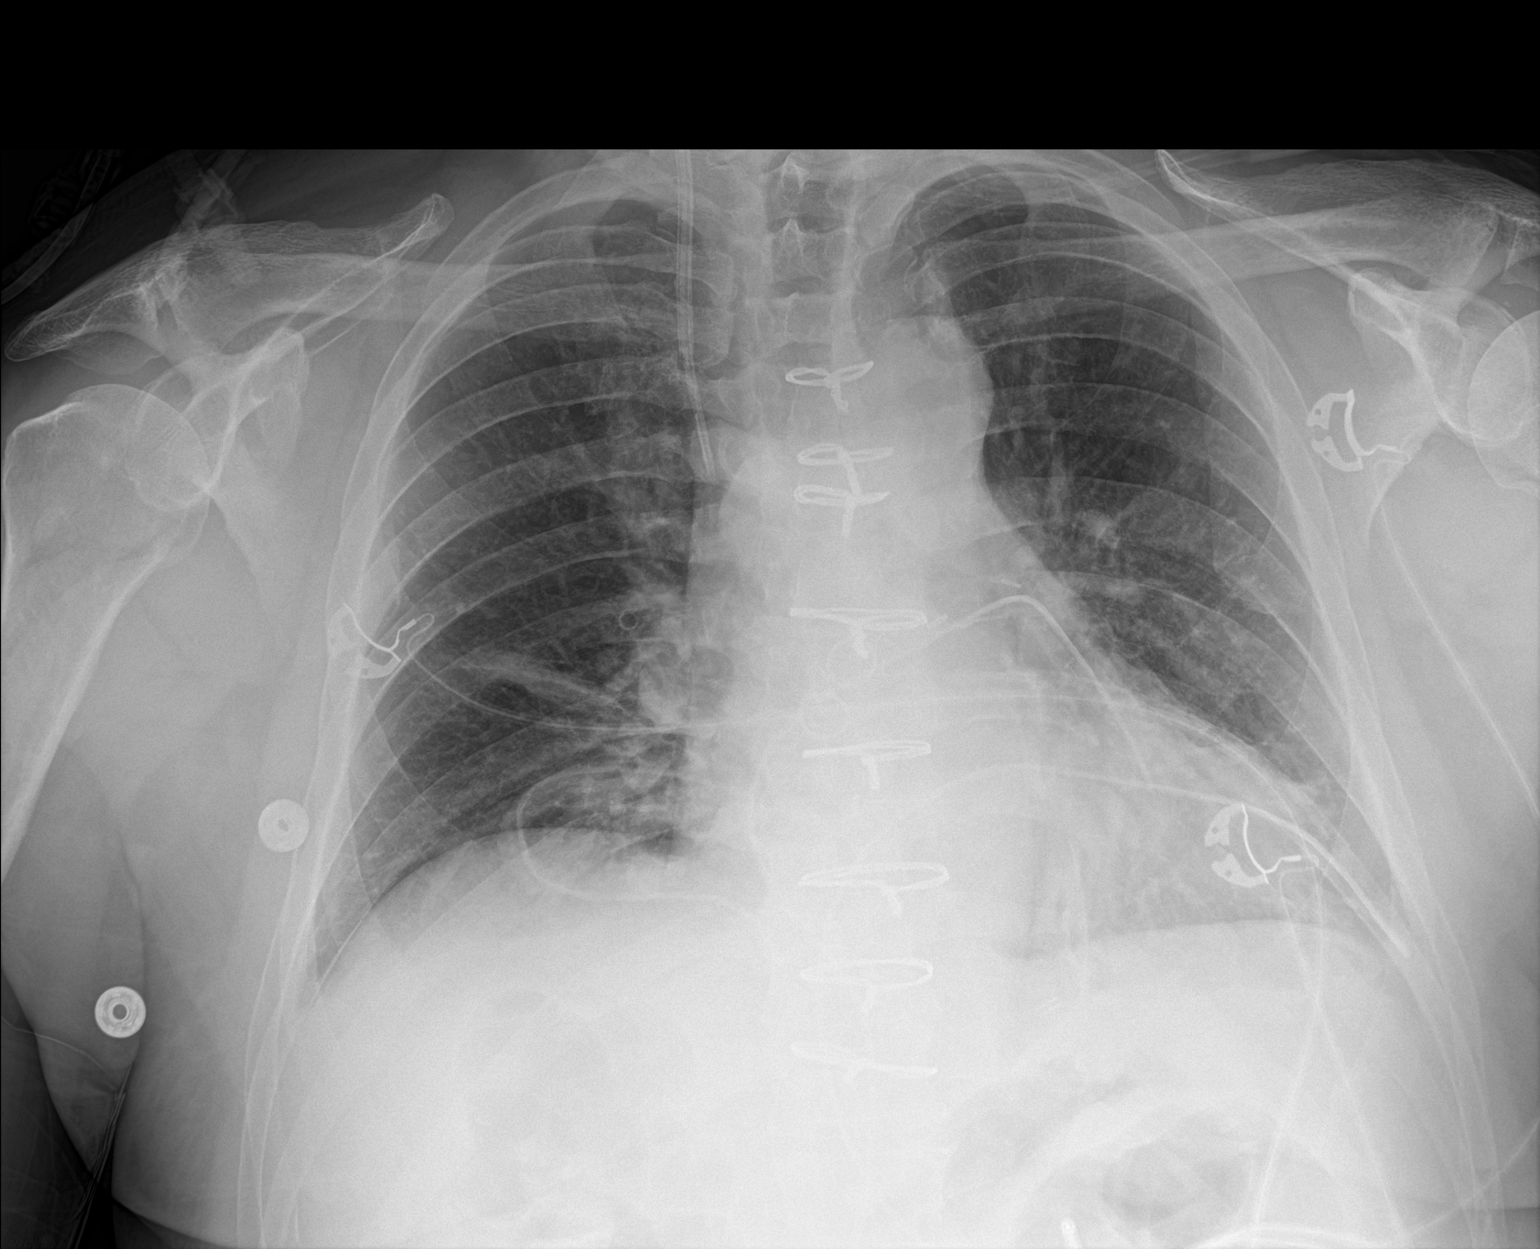

[1 of 1 positions shown; findings below may reference images not displayed]

FINDINGS: Right IJ line tip: Upper SVC. Mediastinal drains unchanged. No
well-defined pneumothorax. Stable mild linear opacities at the lung
bases favoring subsegmental atelectasis. No blunting of the
costophrenic angles. Normally aligned sternotomy wires. Upper normal
heart size without edema.
IMPRESSION: 1. Overall stable radiographic appearance of the chest with
unchanged support apparatus and mild bibasilar atelectasis.

## 2023-04-09 ENCOUNTER — Other Ambulatory Visit: Payer: Self-pay | Admitting: Cardiology

## 2023-04-20 ENCOUNTER — Ambulatory Visit: Payer: Self-pay

## 2023-04-20 NOTE — Progress Notes (Unsigned)
Patient ID: VIRL DONSON                 DOB: Jan 17, 1961                    MRN: [id number]      HPI: Antonio [name] is a 62 y.o. male patient referred to lipid clinic by Dr.Skains. PMH is significant for CAD, HX of NSTEMI, s/p CABG, HLD.    Reviewed options for lowering LDL cholesterol, including ezetimibe, PCSK-9 inhibitors, bempedoic acid and inclisiran.  Discussed mechanisms of action, dosing, side effects and potential decreases in LDL cholesterol.  Also reviewed cost information and potential options for patient assistance.  Current Medications: Crestor 20 mg daily  Intolerances:  Risk Factors: CAD, HX of NSTEMI, s/p CABG, HLD.  LDL goal:   Diet:   Exercise:   Family History:   Social History:   Labs: Lab Results  Component Value Date   CHOL 166 03/17/2023   HDL 47 03/17/2023   LDLCALC 101 (H) 03/17/2023   LDLDIRECT 167.2 04/21/2006   TRIG 100 03/17/2023   CHOLHDL 3.5 03/17/2023    Lipid Panel     Component Value Date/Time   CHOL 166 03/17/2023 0814   TRIG 100 03/17/2023 0814   TRIG 74 04/21/2006 0727   HDL 47 03/17/2023 0814   CHOLHDL 3.5 03/17/2023 0814   CHOLHDL 4.6 08/02/2021 0804   VLDL 20 08/02/2021 0804   LDLCALC 101 (H) 03/17/2023 0814   LDLDIRECT 167.2 04/21/2006 0727   LABVLDL 18 03/17/2023 0814    Past Medical History:  Diagnosis Date   Actinic keratosis 05/27/2022   right posterior shoulder   Basal cell carcinoma 03/23/2018   right upper lip/inf nasolabial   Basal cell carcinoma 11/23/2019   Right Spinal Upper Back, ED[facility]    Current Outpatient Medications on File Prior to Visit  Medication Sig Dispense Refill   aspirin EC 81 MG tablet Take 81 mg by mouth daily. Swallow whole.     cholecalciferol (VITAMIN D3) 25 MCG (1000 UNIT) tablet Take 1,000 Units by mouth daily.     metoprolol tartrate (LOPRESSOR) 25 MG tablet TAKE ONE TABLET BY MOUTH TWICE DAILY 180 tablet 3   Multiple Vitamins-Minerals (LIVER DETOX PO) Take 1 tablet by mouth daily.      rosuvastatin (CRESTOR) 20 MG tablet Take 1 tablet (20 mg total) by mouth daily. 90 tablet 3   tadalafil (CIALIS) 5 MG tablet Take 5 mg by mouth daily.     testosterone cypionate (DEPOTESTOSTERONE CYPIONATE) 200 MG/ML injection      vitamin B-12 (CYANOCOBALAMIN) 500 MCG tablet Take 500 mcg by mouth daily.     No current facility-administered medications on file prior to visit.    No Known Allergies  Assessment/Plan:  1. Hyperlipidemia -  No problems updated. No problem-specific Assessment & Plan notes found for this encounter.    Thank you,  Carmela Hurt, Pharm.D [facility] A Division of [facility] [address] [place], [place] [address]  Phone: [phone]; Fax: [phone]

## 2023-04-24 IMAGING — CR DG CHEST 2V
2 series · 2 of 2 positions shown · non-contrast
Comparison: 08/08/2021

CLINICAL DATA: History of prior CABG.  Former smoker.

EXAM:
CHEST - 2 VIEW

[w chest pa]
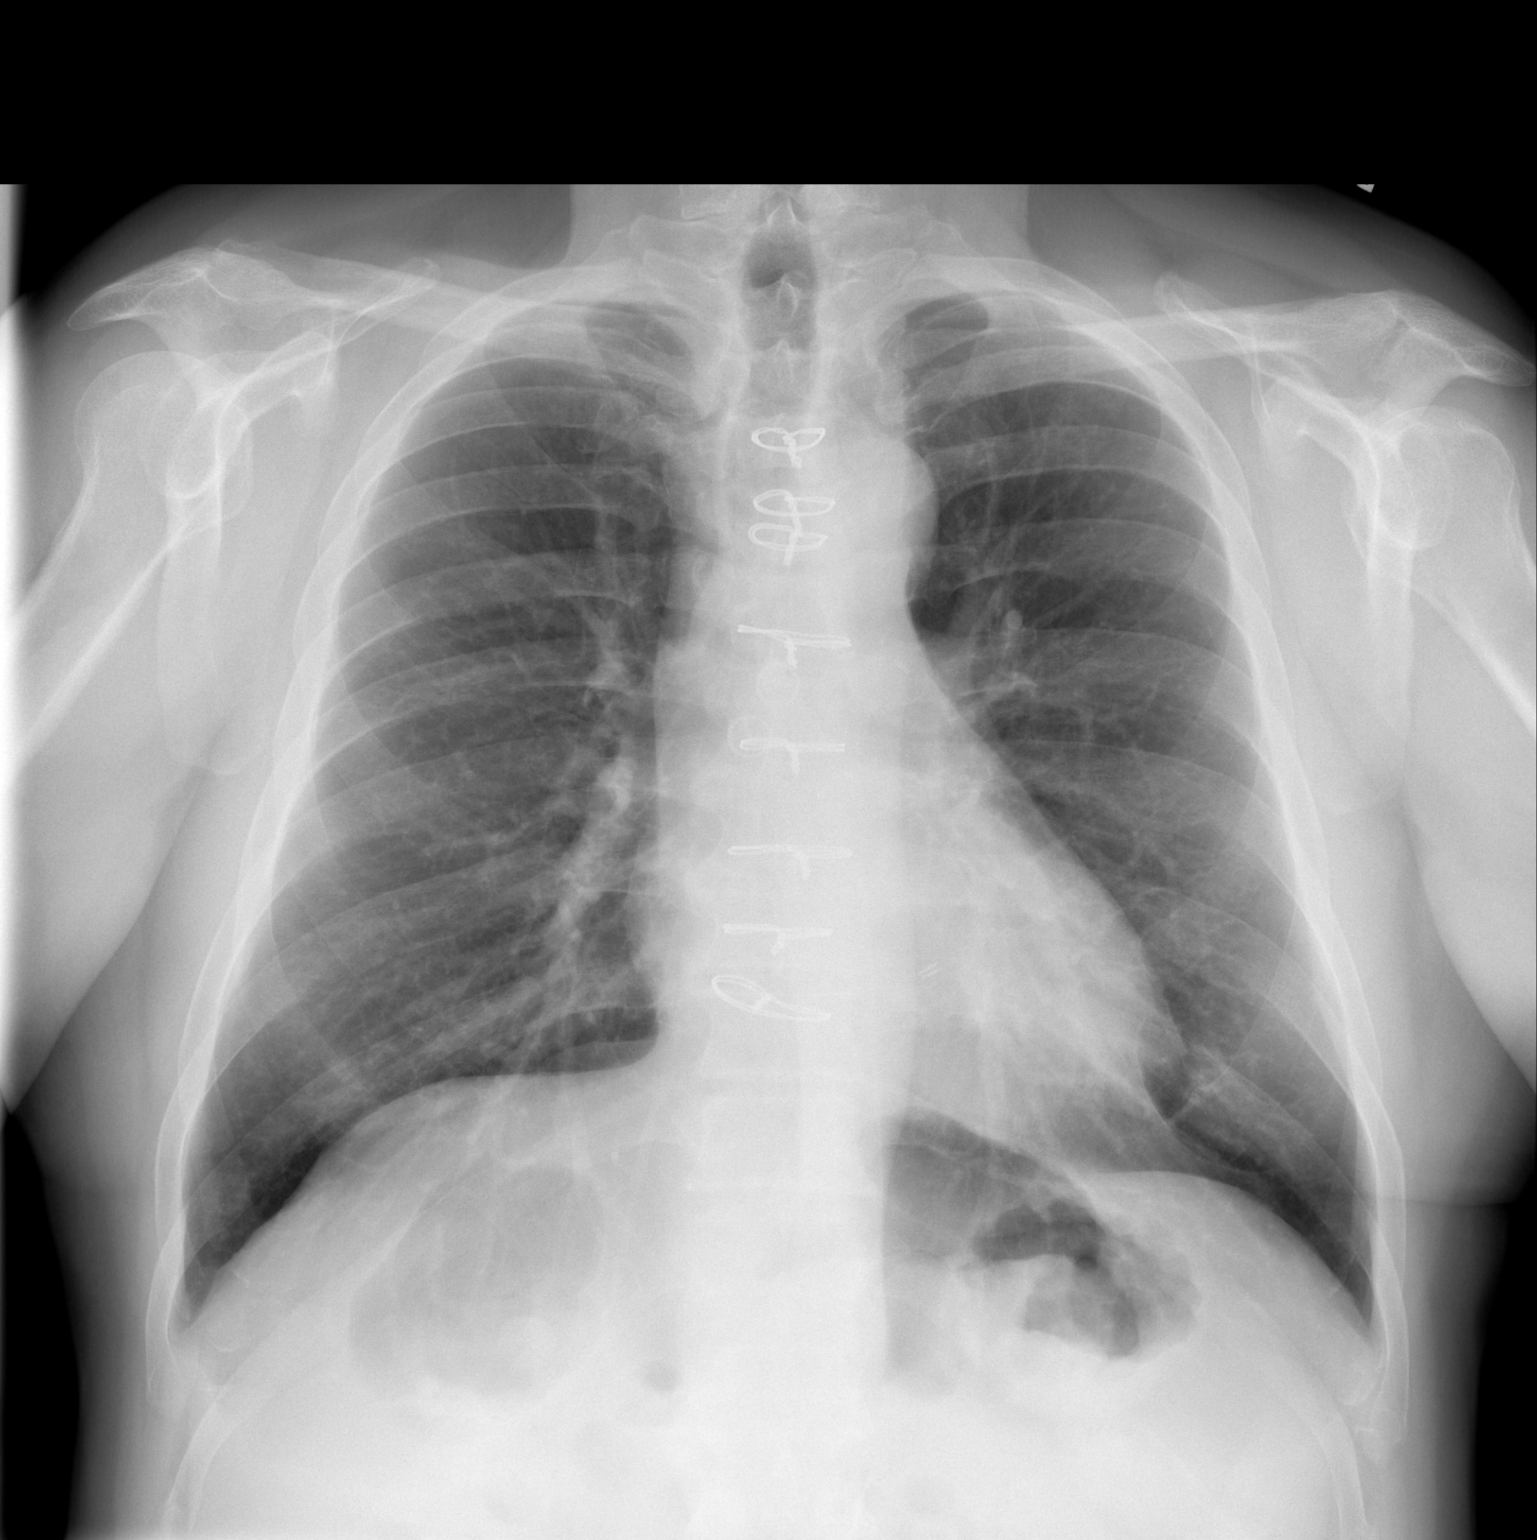

[w chest lat]
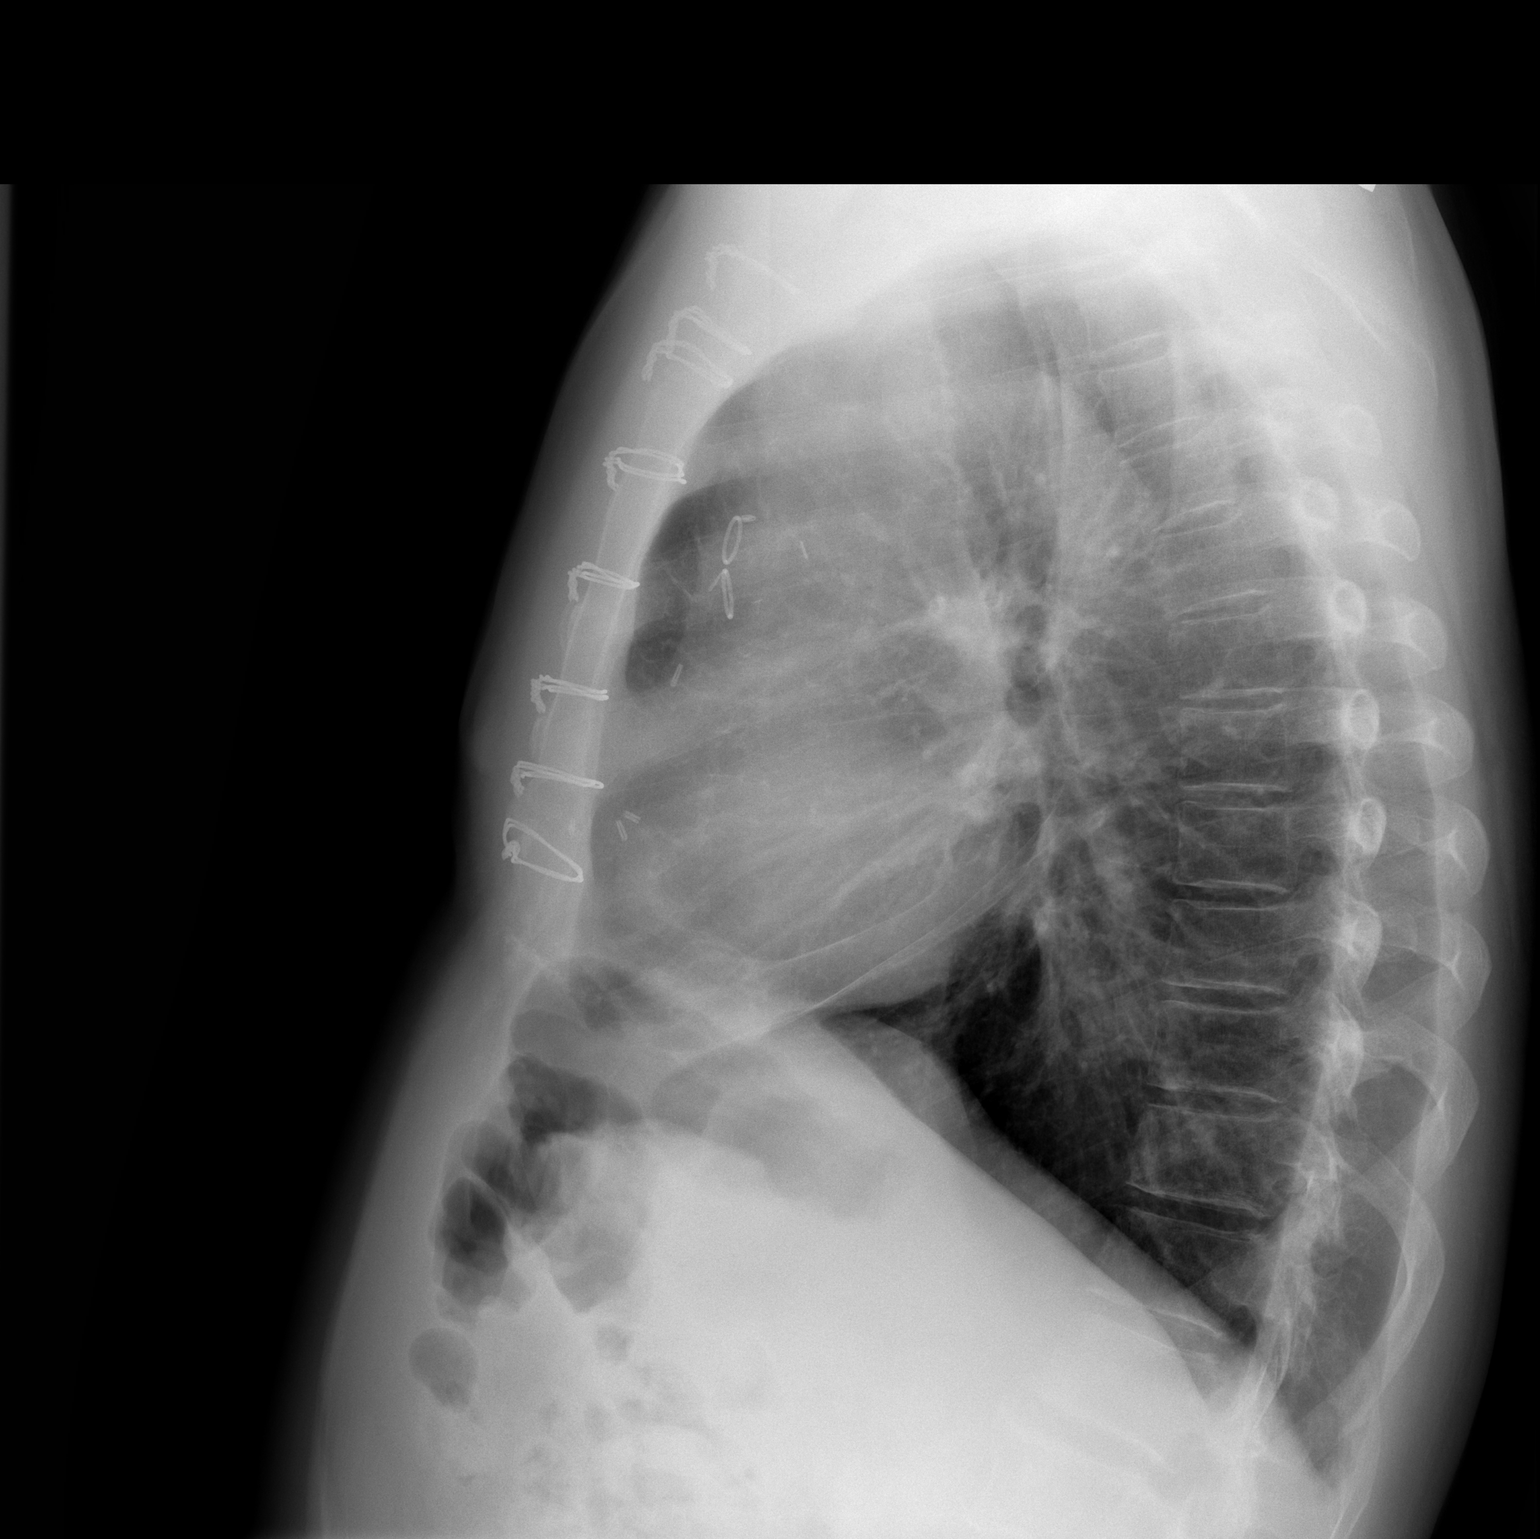

[2 of 2 positions shown; findings below may reference images not displayed]

FINDINGS: No focal consolidation. No pleural effusion or pneumothorax. Heart
and mediastinal contours are unremarkable. Prior CABG.

No acute osseous abnormality.
IMPRESSION: No active cardiopulmonary disease.

## 2023-05-21 ENCOUNTER — Ambulatory Visit: Payer: Self-pay | Attending: Cardiology

## 2023-05-21 NOTE — Progress Notes (Deleted)
 Patient ID: ISAIR INABINET                 DOB: 08-Feb-1961                    MRN: [id number]      HPI: Antonio [name] is a 63 y.o. male patient referred to lipid clinic by Dr. Jeffrie. PMH is significant for NSTEMI, CAD s/p CABG in March 2023, HLD, dilated ascending aorta, HTN and basal cell carcinoma.  Patient seen by Dr. Jeffrie in Aug 2024. Had complaints of achiness on atorvastatin  80mg . Changed to rosuvastatin  20mg  daily. LDL-C in Nov was 101. Previously 84.   Taking rosuvastatin ? Insurance?  Reviewed options for lowering LDL cholesterol, including ezetimibe, PCSK-9 inhibitors, bempedoic acid and inclisiran.  Discussed mechanisms of action, dosing, side effects and potential decreases in LDL cholesterol.  Also reviewed cost information and potential options for patient assistance.   Current Medications: rosuvastatin  20mg  Intolerances: atorvastatin  80mg  (muscle aches) Risk Factors:  LDL-C goal:  ApoB goal:   Diet:   Exercise:   Family History:   Social History:   Labs: Lipid Panel     Component Value Date/Time   CHOL 166 03/17/2023 0814   TRIG 100 03/17/2023 0814   TRIG 74 04/21/2006 0727   HDL 47 03/17/2023 0814   CHOLHDL 3.5 03/17/2023 0814   CHOLHDL 4.6 08/02/2021 0804   VLDL 20 08/02/2021 0804   LDLCALC 101 (H) 03/17/2023 0814   LDLDIRECT 167.2 04/21/2006 0727   LABVLDL 18 03/17/2023 0814    Past Medical History:  Diagnosis Date   Actinic keratosis 05/27/2022   right posterior shoulder   Basal cell carcinoma 03/23/2018   right upper lip/inf nasolabial   Basal cell carcinoma 11/23/2019   Right Spinal Upper Back, EDC    Current Outpatient Medications on File Prior to Visit  Medication Sig Dispense Refill   aspirin  EC 81 MG tablet Take 81 mg by mouth daily. Swallow whole.     cholecalciferol  (VITAMIN D3) 25 MCG (1000 UNIT) tablet Take 1,000 Units by mouth daily.     metoprolol  tartrate (LOPRESSOR ) 25 MG tablet TAKE ONE TABLET BY MOUTH TWICE DAILY 180 tablet 3    Multiple Vitamins-Minerals (LIVER DETOX PO) Take 1 tablet by mouth daily.     rosuvastatin  (CRESTOR ) 20 MG tablet Take 1 tablet (20 mg total) by mouth daily. 90 tablet 3   tadalafil (CIALIS) 5 MG tablet Take 5 mg by mouth daily.     testosterone cypionate (DEPOTESTOSTERONE CYPIONATE) 200 MG/ML injection      vitamin B-12 (CYANOCOBALAMIN) 500 MCG tablet Take 500 mcg by mouth daily.     No current facility-administered medications on file prior to visit.    No Known Allergies  Assessment/Plan:  1. Hyperlipidemia -  No problem-specific Assessment & Plan notes found for this encounter.    Thank you,  Jeovany Huitron D Dailey Buccheri, Pharm.D, BCACP, BCPS, CPP [facility] Division of [facility] [address], [place], [place] [address]  Phone: [phone]; Fax: [phone]

## 2023-09-14 ENCOUNTER — Ambulatory Visit: Payer: Self-pay | Admitting: Dermatology

## 2023-11-04 ENCOUNTER — Ambulatory Visit (INDEPENDENT_AMBULATORY_CARE_PROVIDER_SITE_OTHER): Payer: Self-pay | Admitting: Dermatology

## 2023-11-04 DIAGNOSIS — Z872 Personal history of diseases of the skin and subcutaneous tissue: Secondary | ICD-10-CM

## 2023-11-04 DIAGNOSIS — L570 Actinic keratosis: Secondary | ICD-10-CM

## 2023-11-04 DIAGNOSIS — L821 Other seborrheic keratosis: Secondary | ICD-10-CM

## 2023-11-04 DIAGNOSIS — D492 Neoplasm of unspecified behavior of bone, soft tissue, and skin: Secondary | ICD-10-CM

## 2023-11-04 DIAGNOSIS — D692 Other nonthrombocytopenic purpura: Secondary | ICD-10-CM

## 2023-11-04 DIAGNOSIS — Z1283 Encounter for screening for malignant neoplasm of skin: Secondary | ICD-10-CM

## 2023-11-04 DIAGNOSIS — C44321 Squamous cell carcinoma of skin of nose: Secondary | ICD-10-CM

## 2023-11-04 DIAGNOSIS — L814 Other melanin hyperpigmentation: Secondary | ICD-10-CM

## 2023-11-04 DIAGNOSIS — D229 Melanocytic nevi, unspecified: Secondary | ICD-10-CM

## 2023-11-04 DIAGNOSIS — L578 Other skin changes due to chronic exposure to nonionizing radiation: Secondary | ICD-10-CM

## 2023-11-04 DIAGNOSIS — D225 Melanocytic nevi of trunk: Secondary | ICD-10-CM

## 2023-11-04 DIAGNOSIS — W908XXA Exposure to other nonionizing radiation, initial encounter: Secondary | ICD-10-CM

## 2023-11-04 DIAGNOSIS — D1801 Hemangioma of skin and subcutaneous tissue: Secondary | ICD-10-CM

## 2023-11-04 DIAGNOSIS — Z85828 Personal history of other malignant neoplasm of skin: Secondary | ICD-10-CM

## 2023-11-04 DIAGNOSIS — C4492 Squamous cell carcinoma of skin, unspecified: Secondary | ICD-10-CM

## 2023-11-04 HISTORY — DX: Squamous cell carcinoma of skin, unspecified: C44.92

## 2023-11-04 NOTE — Progress Notes (Signed)
 Follow-Up Visit   Subjective  Antonio [name] is a 63 y.o. male who presents for the following: Skin Cancer Screening and Upper Body Skin Exam. Patient with hx of BCC, spot at nose. Patient did use 5FU cream to face for treatment of AKs and applied it for about 5 days, had a good reaction.   The patient presents for Upper Body Skin Exam (UBSE) for skin cancer screening and mole check. The patient has spots, moles and lesions to be evaluated, some may be new or changing and the patient may have concern these could be cancer.   The following portions of the chart were reviewed this encounter and updated as appropriate: medications, allergies, medical history  Review of Systems:  No other skin or systemic complaints except as noted in HPI or Assessment and Plan.  Objective  Well appearing patient in no apparent distress; mood and affect are within normal limits.  All skin waist up examined. Relevant physical exam findings are noted in the Assessment and Plan.  R upper nasal dorsum 3 mm pink depressed macule- has grown and has bled per pt  L hand dorsum x1, L forearm x2, R posterior shoulder x1 (4) Pink scaly macules  Assessment & Plan   NEOPLASM OF SKIN R upper nasal dorsum Skin / nail biopsy Type of biopsy: tangential   Informed consent: discussed and consent obtained   Anesthesia: the lesion was anesthetized in a standard fashion   Anesthesia comment:  Area prepped with alcohol Anesthetic:  1% lidocaine  w/ epinephrine  1-100,000 buffered w/ 8.4% NaHCO3 Instrument used: flexible razor blade   Hemostasis achieved with: pressure, aluminum chloride and electrodesiccation   Outcome: patient tolerated procedure well   Post-procedure details: wound care instructions given   Post-procedure details comment:  Ointment and small bandage applied Specimen 1 - Surgical pathology Differential Diagnosis: Dilated pore R/O BCC  Check Margins: No 3 mm pink depressed macule Discussed with  patient if biopsy proves BCC would recommend MOHs surgery. Patient has had previous MOHs with Dr. Gregorio at [facility] and would prefer to be referred to him. AK (ACTINIC KERATOSIS) (4) L hand dorsum x1, L forearm x2, R posterior shoulder x1 (4) Actinic keratoses are precancerous spots that appear secondary to cumulative UV radiation exposure/sun exposure over time. They are chronic with expected duration over 1 year. A portion of actinic keratoses will progress to squamous cell carcinoma of the skin. It is not possible to reliably predict which spots will progress to skin cancer and so treatment is recommended to prevent development of skin cancer.  Recommend daily broad spectrum sunscreen SPF 30+ to sun-exposed areas, reapply every 2 hours as needed.  Recommend staying in the shade or wearing long sleeves, sun glasses (UVA+UVB protection) and wide brim hats (4-inch brim around the entire circumference of the hat). Call for new or changing lesions. Destruction of lesion - L hand dorsum x1, L forearm x2, R posterior shoulder x1 (4)  Destruction method: cryotherapy   Informed consent: discussed and consent obtained   Lesion destroyed using liquid nitrogen: Yes   Region frozen until ice ball extended beyond lesion: Yes   Outcome: patient tolerated procedure well with no complications   Post-procedure details: wound care instructions given     Skin cancer screening performed today.  ACTINIC DAMAGE WITH PRECANCEROUS ACTINIC KERATOSES Counseling for Topical Chemotherapy Management: Patient exhibits: - Severe, confluent actinic changes with pre-cancerous actinic keratoses that is secondary to cumulative UV radiation exposure over time - Condition that  is severe; chronic, not at goal. - diffuse scaly erythematous macules and papules with underlying dyspigmentation - Discussed Prescription Field Treatment topical Chemotherapy for Severe, Chronic Confluent Actinic Changes with Pre-Cancerous Actinic  Keratoses Field treatment involves treatment of an entire area of skin that has confluent Actinic Changes (Sun/ Ultraviolet light damage) and PreCancerous Actinic Keratoses by method of PhotoDynamic Therapy (PDT) and/or prescription Topical Chemotherapy agents such as 5-fluorouracil, 5-fluorouracil/calcipotriene, and/or imiquimod.  The purpose is to decrease the number of clinically evident and subclinical PreCancerous lesions to prevent progression to development of skin cancer by chemically destroying early precancer changes that may or may not be visible.  It has been shown to reduce the risk of developing skin cancer in the treated area. As a result of treatment, redness, scaling, crusting, and open sores may occur during treatment course. One or more than one of these methods may be used and may have to be used several times to control, suppress and eliminate the PreCancerous changes. Discussed treatment course, expected reaction, and possible side effects. - Recommend daily broad spectrum sunscreen SPF 30+ to sun-exposed areas, reapply every 2 hours as needed.  - Staying in the shade or wearing long sleeves, sun glasses (UVA+UVB protection) and wide brim hats (4-inch brim around the entire circumference of the hat) are also recommended. - Call for new or changing lesions. - Discussed if patient would like to do treatment in the fall/winter with 5FU cream he may do so if rough spots at face reoccur.   Lentigines, Seborrheic Keratoses, Hemangiomas - Benign normal skin lesions - Benign-appearing - Call for any changes  Nevus vs Lentigo Exam: 5 mm tan macule with notch at R paraspinal upper back  Treatment plan: Benign-appearing.  Observation.  Call clinic for new or changing lesions.  Recommend daily use of broad spectrum spf 30+ sunscreen to sun-exposed areas.  Recheck at f/u  Melanocytic Nevi - Tan-brown and/or pink-flesh-colored symmetric macules and papules - Benign appearing on exam  today - Observation - Call clinic for new or changing moles - Recommend daily use of broad spectrum spf 30+ sunscreen to sun-exposed areas.   HISTORY OF BASAL CELL CARCINOMA OF THE SKIN Right upper lip/inf nasolabial, 2019 Right spinal upper back, 2021 - No evidence of recurrence today - Recommend regular full body skin exams - Recommend daily broad spectrum sunscreen SPF 30+ to sun-exposed areas, reapply every 2 hours as needed.  - Call if any new or changing lesions are noted between office visits  HISTORY OF PRECANCEROUS ACTINIC KERATOSIS s/p 5FU cream - site(s) of PreCancerous Actinic Keratosis clear today at face, R posterior shoulder, L lower neck  - these may recur and new lesions may form requiring treatment to prevent transformation into skin cancer - observe for new or changing spots and contact [facility] for appointment if occur - photoprotection with sun protective clothing; sunglasses and broad spectrum sunscreen with SPF of at least 30 + and frequent self skin exams recommended - yearly exams by a dermatologist recommended for persons with history of PreCancerous Actinic Keratoses  Purpura - Chronic; persistent and recurrent.  Treatable, but not curable. - Violaceous macules and patches - Benign - Related to trauma, age, sun damage and/or use of blood thinners, chronic use of topical and/or oral steroids - Observe - Can use OTC arnica containing moisturizer such as Dermend Bruise Formula if desired - Call for worsening or other concerns  Return in 6 months (on 05/05/2024) for w/ Dr. Jackquline, HxBCC, UBSE.  I, Jacquelynn V. Wilfred, CMA, am acting as scribe for Rexene Rattler, MD .   Documentation: I have reviewed the above documentation for accuracy and completeness, and I agree with the above.  Rexene Rattler, MD

## 2023-11-04 NOTE — Patient Instructions (Addendum)
 Cryotherapy Aftercare  Wash gently with soap and water everyday.   Apply Vaseline and Band-Aid daily until healed.    Wound Care Instructions  Cleanse wound gently with soap and water once a day then pat dry with clean gauze. Apply a thin coat of Petrolatum (petroleum jelly, "Vaseline") over the wound (unless you have an allergy to this). We recommend that you use a new, sterile tube of Vaseline. Do not pick or remove scabs. Do not remove the yellow or white "healing tissue" from the base of the wound.  Cover the wound with fresh, clean, nonstick gauze and secure with paper tape. You may use Band-Aids in place of gauze and tape if the wound is small enough, but would recommend trimming much of the tape off as there is often too much. Sometimes Band-Aids can irritate the skin.  You should call the office for your biopsy report after 1 week if you have not already been contacted.  If you experience any problems, such as abnormal amounts of bleeding, swelling, significant bruising, significant pain, or evidence of infection, please call the office immediately.  FOR ADULT SURGERY PATIENTS: If you need something for pain relief you may take 1 extra strength Tylenol (acetaminophen) AND 2 Ibuprofen (200mg  each) together every 4 hours as needed for pain. (do not take these if you are allergic to them or if you have a reason you should not take them.) Typically, you may only need pain medication for 1 to 3 days.    Melanoma ABCDEs  Melanoma is the most dangerous type of skin cancer, and is the leading cause of death from skin disease.  You are more likely to develop melanoma if you: Have light-colored skin, light-colored eyes, or red or blond hair Spend a lot of time in the sun Tan regularly, either outdoors or in a tanning bed Have had blistering sunburns, especially during childhood Have a close family member who has had a melanoma Have atypical moles or large birthmarks  Early detection of  melanoma is key since treatment is typically straightforward and cure rates are extremely high if we catch it early.   The first sign of melanoma is often a change in a mole or a new dark spot.  The ABCDE system is a way of remembering the signs of melanoma.  A for asymmetry:  The two halves do not match. B for border:  The edges of the growth are irregular. C for color:  A mixture of colors are present instead of an even brown color. D for diameter:  Melanomas are usually (but not always) greater than 6mm - the size of a pencil eraser. E for evolution:  The spot keeps changing in size, shape, and color.  Please check your skin once per month between visits. You can use a small mirror in front and a large mirror behind you to keep an eye on the back side or your body.   If you see any new or changing lesions before your next follow-up, please call to schedule a visit.  Please continue daily skin protection including broad spectrum sunscreen SPF 30+ to sun-exposed areas, reapplying every 2 hours as needed when you're outdoors.   Staying in the shade or wearing long sleeves, sun glasses (UVA+UVB protection) and wide brim hats (4-inch brim around the entire circumference of the hat) are also recommended for sun protection.    Due to recent changes in healthcare laws, you may see results of your pathology and/or laboratory studies  on MyChart before the doctors have had a chance to review them. We understand that in some cases there may be results that are confusing or concerning to you. Please understand that not all results are received at the same time and often the doctors may need to interpret multiple results in order to provide you with the best plan of care or course of treatment. Therefore, we ask that you please give [place] 2 business days to thoroughly review all your results before contacting the office for clarification. Should we see a critical lab result, you will be contacted sooner.   If  You Need Anything After Your Visit  If you have any questions or concerns for your doctor, please call our main line at [phone] and press option 4 to reach your doctor's medical assistant. If no one answers, please leave a voicemail as directed and we will return your call as soon as possible. Messages left after 4 pm will be answered the following business day.   You may also send [place] a message via MyChart. We typically respond to MyChart messages within 1-2 business days.  For prescription refills, please ask your pharmacy to contact our office. Our fax number is [phone].  If you have an urgent issue when the clinic is closed that cannot wait until the next business day, you can page your doctor at the number below.    Please note that while we do our best to be available for urgent issues outside of office hours, we are not available 24/7.   If you have an urgent issue and are unable to reach [place], you may choose to seek medical care at your doctor's office, retail clinic, urgent care center, or emergency room.  If you have a medical emergency, please immediately call 911 or go to the emergency department.  Pager Numbers  - Dr. Gwen Pounds: [phone]  - Dr. Roseanne Reno: [phone]  - Dr. Katrinka Blazing: [phone]   In the event of inclement weather, please call our main line at [phone] for an update on the status of any delays or closures.  Dermatology Medication Tips: Please keep the boxes that topical medications come in in order to help keep track of the instructions about where and how to use these. Pharmacies typically print the medication instructions only on the boxes and not directly on the medication tubes.   If your medication is too expensive, please contact our office at [phone] option 4 or send [place] a message through MyChart.   We are unable to tell what your co-pay for medications will be in advance as this is different depending on your insurance coverage.  However, we may be able to find a substitute medication at lower cost or fill out paperwork to get insurance to cover a needed medication.   If a prior authorization is required to get your medication covered by your insurance company, please allow [place] 1-2 business days to complete this process.  Drug prices often vary depending on where the prescription is filled and some pharmacies may offer cheaper prices.  The website www.goodrx.com contains coupons for medications through different pharmacies. The prices here do not account for what the cost may be with help from insurance (it may be cheaper with your insurance), but the website can give you the price if you did not use any insurance.  - You can print the associated coupon and take it with your prescription to the pharmacy.  - You may also stop by our office during  regular business hours and pick up a GoodRx coupon card.  - If you need your prescription sent electronically to a different pharmacy, notify our office through [facility] or by phone at [phone] option 4.     Si Usted Necesita Algo Despus de Su Visita  Tambin puede enviarnos un mensaje a travs de Clinical cytogeneticist. Por lo general respondemos a los mensajes de MyChart en el transcurso de 1 a 2 das hbiles.  Para renovar recetas, por favor pida a su farmacia que se ponga en contacto con nuestra oficina. Annie Sable de fax es [place] [phone].  Si tiene un asunto urgente cuando la clnica est cerrada y que no puede esperar hasta el siguiente da hbil, puede llamar/localizar a su doctor(a) al nmero que aparece a continuacin.   Por favor, tenga en cuenta que aunque hacemos todo lo posible para estar disponibles para asuntos urgentes fuera del horario de [place], no estamos disponibles las 24 horas del da, los 7 [address]  [address] de la [place].   Si tiene un problema urgente y no puede comunicarse con nosotros, puede optar por buscar atencin mdica  en el consultorio de su  doctor(a), en una clnica privada, en un centro de atencin urgente o en una sala de emergencias.  Si tiene Engineer, drilling, por favor llame inmediatamente al 911 o vaya a la sala de emergencias.  Nmeros de bper  - Dr. Gwen Pounds: [phone]  - Dra. Roseanne Reno: [phone]  - Dr. Katrinka Blazing: [phone]   En caso de inclemencias del tiempo, por favor llame a Lacy Duverney principal al [phone] para una actualizacin sobre el [place] de cualquier retraso o cierre.  Consejos para la medicacin en dermatologa: Por favor, guarde las cajas en las que vienen los medicamentos de uso tpico para ayudarle a seguir las instrucciones sobre dnde y cmo usarlos. Las farmacias generalmente imprimen las instrucciones del medicamento slo en las cajas y no directamente en los tubos del [place].   Si su medicamento es muy caro, por favor, pngase en contacto con Rolm Gala llamando al [phone] y presione la opcin 4 o envenos un mensaje a travs de Clinical cytogeneticist.   No podemos decirle cul ser su copago por los medicamentos por adelantado ya que esto es diferente dependiendo de la cobertura de su seguro. Sin embargo, es posible que podamos encontrar un medicamento sustituto a Audiological scientist un formulario para que el seguro cubra el medicamento que se considera necesario.   Si se requiere una autorizacin previa para que su compaa de seguros [place] su medicamento, por favor permtanos de 1 a 2 das hbiles para completar [address]  Los precios de los medicamentos varan con frecuencia dependiendo del Environmental consultant de dnde se surte la receta y alguna farmacias pueden ofrecer precios ms baratos.  El sitio web www.goodrx.com tiene cupones para medicamentos de Health and safety inspector. Los precios aqu no tienen en cuenta lo que podra costar con la ayuda del seguro (puede ser ms barato con su seguro), pero el sitio web puede darle el precio si no utiliz Tourist information centre manager.  - Puede imprimir el  cupn correspondiente y llevarlo con su receta a la farmacia.  - Tambin puede pasar por nuestra oficina durante el horario de atencin regular y Education officer, museum una tarjeta de cupones de GoodRx.  - Si necesita que su receta se enve electrnicamente a una farmacia diferente, informe a nuestra oficina a travs de MyChart de [place] o por telfono llamando al [phone] y presione la opcin 4.

## 2023-11-05 LAB — SURGICAL PATHOLOGY

## 2023-11-09 ENCOUNTER — Encounter: Payer: Self-pay | Admitting: Dermatology

## 2023-11-09 ENCOUNTER — Ambulatory Visit: Payer: Self-pay | Admitting: Dermatology

## 2023-11-09 DIAGNOSIS — C4492 Squamous cell carcinoma of skin, unspecified: Secondary | ICD-10-CM

## 2023-11-09 NOTE — Telephone Encounter (Signed)
 Left pt msg to call for bx result/sh

## 2023-11-09 NOTE — Telephone Encounter (Signed)
-----   Message from Rexene Rattler sent at 11/09/2023  8:30 AM EDT ----- 1. 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 Pt needs Mohs surgery and prefers Dr. Gregorio at [facility]- please call patient ----- Message ----- From: Interface, Lab In Three Zero One Sent: 11/05/2023   4:46 PM EDT To: Rexene Rattler, MD

## 2023-11-10 ENCOUNTER — Encounter: Payer: Self-pay | Admitting: Dermatology

## 2023-11-10 NOTE — Telephone Encounter (Signed)
 Left pt another message to call for bx results./sh

## 2023-11-10 NOTE — Telephone Encounter (Signed)
-----   Message from Rexene Rattler sent at 11/09/2023  8:30 AM EDT ----- 1. 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 Pt needs Mohs surgery and prefers Dr. Gregorio at [facility]- please call patient ----- Message ----- From: Interface, Lab In Three Zero One Sent: 11/05/2023   4:46 PM EDT To: Rexene Rattler, MD

## 2023-11-12 NOTE — Addendum Note (Signed)
 Addended by: TERESA PALMA R on: 11/12/2023 02:00 PM   Modules accepted: Orders

## 2023-11-12 NOTE — Telephone Encounter (Signed)
 Patient advised of BX results and agrees with referral to Dr. Gregorio at [facility]. aw

## 2024-05-16 ENCOUNTER — Ambulatory Visit: Payer: Self-pay | Admitting: Dermatology

## 2024-06-01 ENCOUNTER — Other Ambulatory Visit: Payer: Self-pay | Admitting: Cardiology
